# Patient Record
Sex: Male | Born: 1940 | Race: White | Hispanic: No | Marital: Married | Smoking: Current some day smoker
Health system: Southern US, Community
[De-identification: ages and names within clinical notes are randomized; demographics above are authoritative.]

## PROBLEM LIST (undated history)

## (undated) DIAGNOSIS — H269 Unspecified cataract: Secondary | ICD-10-CM

## (undated) DIAGNOSIS — M1990 Unspecified osteoarthritis, unspecified site: Secondary | ICD-10-CM

## (undated) DIAGNOSIS — N186 End stage renal disease: Secondary | ICD-10-CM

## (undated) DIAGNOSIS — J449 Chronic obstructive pulmonary disease, unspecified: Secondary | ICD-10-CM

## (undated) DIAGNOSIS — Z9289 Personal history of other medical treatment: Secondary | ICD-10-CM

## (undated) DIAGNOSIS — R0602 Shortness of breath: Secondary | ICD-10-CM

## (undated) DIAGNOSIS — C44300 Unspecified malignant neoplasm of skin of unspecified part of face: Secondary | ICD-10-CM

## (undated) DIAGNOSIS — E119 Type 2 diabetes mellitus without complications: Secondary | ICD-10-CM

## (undated) DIAGNOSIS — E46 Unspecified protein-calorie malnutrition: Secondary | ICD-10-CM

## (undated) DIAGNOSIS — Z992 Dependence on renal dialysis: Secondary | ICD-10-CM

## (undated) DIAGNOSIS — J189 Pneumonia, unspecified organism: Secondary | ICD-10-CM

## (undated) DIAGNOSIS — Z950 Presence of cardiac pacemaker: Secondary | ICD-10-CM

## (undated) DIAGNOSIS — C9000 Multiple myeloma not having achieved remission: Secondary | ICD-10-CM

## (undated) DIAGNOSIS — I10 Essential (primary) hypertension: Secondary | ICD-10-CM

## (undated) HISTORY — PX: AV FISTULA PLACEMENT: SHX1204

## (undated) HISTORY — PX: APPENDECTOMY: SHX54

---

## 1998-11-15 HISTORY — PX: PARTIAL NEPHRECTOMY: SHX414

## 2012-06-27 ENCOUNTER — Other Ambulatory Visit: Payer: Self-pay | Admitting: Nephrology

## 2012-06-30 ENCOUNTER — Ambulatory Visit
Admission: RE | Admit: 2012-06-30 | Discharge: 2012-06-30 | Disposition: A | Discharge status: Home / Self Care | Payer: Medicare Other | Source: Ambulatory Visit | Attending: Nephrology | Admitting: Nephrology

## 2012-07-14 HISTORY — PX: INSERT / REPLACE / REMOVE PACEMAKER: SUR710

## 2013-02-03 ENCOUNTER — Other Ambulatory Visit (HOSPITAL_COMMUNITY)
Admission: RE | Admit: 2013-02-03 | Disposition: A | Discharge status: Home / Self Care | Payer: Medicare Other | Source: Ambulatory Visit | Attending: Oncology | Admitting: Oncology

## 2013-02-03 LAB — BONE MARROW EXAM: Bone Marrow Exam: 820

## 2013-03-07 LAB — TISSUE HYBRIDIZATION (BONE MARROW)-NCBH

## 2013-03-07 LAB — CHROMOSOME ANALYSIS, BONE MARROW

## 2013-03-17 ENCOUNTER — Observation Stay (HOSPITAL_COMMUNITY): Payer: Medicare HMO

## 2013-03-17 ENCOUNTER — Encounter (HOSPITAL_COMMUNITY): Payer: Self-pay | Admitting: Emergency Medicine

## 2013-03-17 ENCOUNTER — Inpatient Hospital Stay (HOSPITAL_COMMUNITY)
Admission: EM | Admit: 2013-03-17 | Discharge: 2013-03-22 | DRG: 682 | Disposition: A | Discharge status: Home / Self Care | Payer: Medicare HMO | Attending: Internal Medicine | Admitting: Internal Medicine

## 2013-03-17 DIAGNOSIS — E875 Hyperkalemia: Secondary | ICD-10-CM | POA: Diagnosis present

## 2013-03-17 DIAGNOSIS — N179 Acute kidney failure, unspecified: Principal | ICD-10-CM | POA: Diagnosis present

## 2013-03-17 DIAGNOSIS — Z9221 Personal history of antineoplastic chemotherapy: Secondary | ICD-10-CM

## 2013-03-17 DIAGNOSIS — R64 Cachexia: Secondary | ICD-10-CM | POA: Diagnosis present

## 2013-03-17 DIAGNOSIS — Z87898 Personal history of other specified conditions: Secondary | ICD-10-CM

## 2013-03-17 DIAGNOSIS — N189 Chronic kidney disease, unspecified: Secondary | ICD-10-CM

## 2013-03-17 DIAGNOSIS — E46 Unspecified protein-calorie malnutrition: Secondary | ICD-10-CM | POA: Diagnosis present

## 2013-03-17 DIAGNOSIS — Z79899 Other long term (current) drug therapy: Secondary | ICD-10-CM

## 2013-03-17 DIAGNOSIS — C9000 Multiple myeloma not having achieved remission: Secondary | ICD-10-CM

## 2013-03-17 DIAGNOSIS — F17200 Nicotine dependence, unspecified, uncomplicated: Secondary | ICD-10-CM | POA: Diagnosis present

## 2013-03-17 DIAGNOSIS — E872 Acidosis, unspecified: Secondary | ICD-10-CM | POA: Diagnosis present

## 2013-03-17 DIAGNOSIS — D61818 Other pancytopenia: Secondary | ICD-10-CM | POA: Diagnosis present

## 2013-03-17 DIAGNOSIS — T451X5A Adverse effect of antineoplastic and immunosuppressive drugs, initial encounter: Secondary | ICD-10-CM | POA: Diagnosis present

## 2013-03-17 DIAGNOSIS — E43 Unspecified severe protein-calorie malnutrition: Secondary | ICD-10-CM

## 2013-03-17 DIAGNOSIS — E861 Hypovolemia: Secondary | ICD-10-CM | POA: Diagnosis present

## 2013-03-17 DIAGNOSIS — E860 Dehydration: Secondary | ICD-10-CM | POA: Diagnosis present

## 2013-03-17 DIAGNOSIS — E162 Hypoglycemia, unspecified: Secondary | ICD-10-CM | POA: Diagnosis present

## 2013-03-17 DIAGNOSIS — N184 Chronic kidney disease, stage 4 (severe): Secondary | ICD-10-CM | POA: Diagnosis present

## 2013-03-17 HISTORY — DX: Multiple myeloma not having achieved remission: C90.00

## 2013-03-17 HISTORY — DX: Unspecified osteoarthritis, unspecified site: M19.90

## 2013-03-17 HISTORY — DX: Unspecified malignant neoplasm of skin of unspecified part of face: C44.300

## 2013-03-17 HISTORY — DX: Presence of cardiac pacemaker: Z95.0

## 2013-03-17 HISTORY — DX: Shortness of breath: R06.02

## 2013-03-17 LAB — CBC WITH DIFFERENTIAL/PLATELET
BASOS ABS: 0 10*3/uL (ref 0.0–0.1)
Basophils Relative: 0 % (ref 0–1)
EOS ABS: 0.1 10*3/uL (ref 0.0–0.7)
Eosinophils Relative: 2 % (ref 0–5)
HCT: 25 % — ABNORMAL LOW (ref 39.0–52.0)
Hemoglobin: 8.5 g/dL — ABNORMAL LOW (ref 13.0–17.0)
LYMPHS ABS: 0.6 10*3/uL — AB (ref 0.7–4.0)
LYMPHS PCT: 19 % (ref 12–46)
MCH: 29 pg (ref 26.0–34.0)
MCHC: 34 g/dL (ref 30.0–36.0)
MCV: 85.3 fL (ref 78.0–100.0)
Monocytes Absolute: 0.6 10*3/uL (ref 0.1–1.0)
Monocytes Relative: 21 % — ABNORMAL HIGH (ref 3–12)
NEUTROS PCT: 59 % (ref 43–77)
Neutro Abs: 1.8 10*3/uL (ref 1.7–7.7)
PLATELETS: 67 10*3/uL — AB (ref 150–400)
RBC: 2.93 MIL/uL — ABNORMAL LOW (ref 4.22–5.81)
RDW: 16.1 % — ABNORMAL HIGH (ref 11.5–15.5)
WBC: 3 10*3/uL — AB (ref 4.0–10.5)

## 2013-03-17 LAB — COMPREHENSIVE METABOLIC PANEL
ALK PHOS: 95 U/L (ref 39–117)
ALT: 23 U/L (ref 0–53)
AST: 14 U/L (ref 0–37)
Albumin: 2.9 g/dL — ABNORMAL LOW (ref 3.5–5.2)
BUN: 94 mg/dL — ABNORMAL HIGH (ref 6–23)
CO2: 17 mEq/L — ABNORMAL LOW (ref 19–32)
Calcium: 8.4 mg/dL (ref 8.4–10.5)
Chloride: 107 mEq/L (ref 96–112)
Creatinine, Ser: 5.05 mg/dL — ABNORMAL HIGH (ref 0.50–1.35)
GFR calc non Af Amer: 10 mL/min — ABNORMAL LOW (ref >90)
GFR, EST AFRICAN AMERICAN: 12 mL/min — AB (ref >90)
GLUCOSE: 100 mg/dL — AB (ref 70–99)
POTASSIUM: 5.7 meq/L — AB (ref 3.7–5.3)
SODIUM: 139 meq/L (ref 137–147)
Total Bilirubin: 0.2 mg/dL — ABNORMAL LOW (ref 0.3–1.2)
Total Protein: 6.3 g/dL (ref 6.0–8.3)

## 2013-03-17 LAB — HEPATIC FUNCTION PANEL
ALT: 20 U/L (ref 0–53)
AST: 12 U/L (ref 0–37)
Albumin: 2.7 g/dL — ABNORMAL LOW (ref 3.5–5.2)
Alkaline Phosphatase: 85 U/L (ref 39–117)
TOTAL PROTEIN: 5.9 g/dL — AB (ref 6.0–8.3)
Total Bilirubin: 0.2 mg/dL — ABNORMAL LOW (ref 0.3–1.2)

## 2013-03-17 LAB — POCT I-STAT TROPONIN I: Troponin i, poc: 0.09 ng/mL (ref 0.00–0.08)

## 2013-03-17 LAB — URINE MICROSCOPIC-ADD ON

## 2013-03-17 LAB — URINALYSIS, ROUTINE W REFLEX MICROSCOPIC
Bilirubin Urine: NEGATIVE
GLUCOSE, UA: NEGATIVE mg/dL
KETONES UR: NEGATIVE mg/dL
Leukocytes, UA: NEGATIVE
NITRITE: NEGATIVE
Protein, ur: 30 mg/dL — AB
Specific Gravity, Urine: 1.013 (ref 1.005–1.030)
Urobilinogen, UA: 0.2 mg/dL (ref 0.0–1.0)
pH: 5 (ref 5.0–8.0)

## 2013-03-17 LAB — TROPONIN I

## 2013-03-17 LAB — MAGNESIUM: Magnesium: 2.2 mg/dL (ref 1.5–2.5)

## 2013-03-17 LAB — ABO/RH: ABO/RH(D): A POS

## 2013-03-17 LAB — PHOSPHORUS: PHOSPHORUS: 4.2 mg/dL (ref 2.3–4.6)

## 2013-03-17 MED ORDER — LEVALBUTEROL HCL 0.63 MG/3ML IN NEBU
0.6300 mg | INHALATION_SOLUTION | Freq: Four times a day (QID) | RESPIRATORY_TRACT | Status: DC
  Administered 2013-03-17: 0.63 mg via RESPIRATORY_TRACT
  Filled 2013-03-17: qty 3

## 2013-03-17 MED ORDER — ASPIRIN 325 MG PO TABS
325.0000 mg | ORAL_TABLET | Freq: Every day | ORAL | Status: DC
  Filled 2013-03-17: qty 1

## 2013-03-17 MED ORDER — ONDANSETRON HCL 4 MG PO TABS: 4.0000 mg | ORAL_TABLET | Freq: Four times a day (QID) | ORAL | Status: DC | PRN

## 2013-03-17 MED ORDER — ONDANSETRON HCL 4 MG/2ML IJ SOLN
4.0000 mg | Freq: Four times a day (QID) | INTRAMUSCULAR | Status: DC | PRN
  Administered 2013-03-17: 4 mg via INTRAVENOUS
  Filled 2013-03-17: qty 2

## 2013-03-17 MED ORDER — TAMSULOSIN HCL 0.4 MG PO CAPS
0.4000 mg | ORAL_CAPSULE | Freq: Every day | ORAL | Status: DC
  Administered 2013-03-18 – 2013-03-22 (×5): 0.4 mg via ORAL
  Filled 2013-03-17 (×7): qty 1

## 2013-03-17 MED ORDER — LEVALBUTEROL HCL 0.63 MG/3ML IN NEBU: 0.6300 mg | INHALATION_SOLUTION | Freq: Four times a day (QID) | RESPIRATORY_TRACT | Status: DC | PRN

## 2013-03-17 MED ORDER — ACETAMINOPHEN 325 MG PO TABS: 650.0000 mg | ORAL_TABLET | Freq: Four times a day (QID) | ORAL | Status: DC | PRN

## 2013-03-17 MED ORDER — ACETAMINOPHEN 650 MG RE SUPP: 650.0000 mg | Freq: Four times a day (QID) | RECTAL | Status: DC | PRN

## 2013-03-17 MED ORDER — SODIUM CHLORIDE 0.9 % IV SOLN
INTRAVENOUS | Status: DC
Start: 2013-03-17 — End: 2013-03-18
  Administered 2013-03-17 – 2013-03-18 (×2): via INTRAVENOUS

## 2013-03-17 MED ORDER — DEXAMETHASONE 6 MG PO TABS
40.0000 mg | ORAL_TABLET | Freq: Every day | ORAL | Status: DC
  Filled 2013-03-17: qty 1

## 2013-03-17 MED ORDER — AMIODARONE HCL 200 MG PO TABS
200.0000 mg | ORAL_TABLET | Freq: Every day | ORAL | Status: DC
  Administered 2013-03-18 – 2013-03-22 (×5): 200 mg via ORAL
  Filled 2013-03-17 (×5): qty 1

## 2013-03-17 MED ORDER — ACYCLOVIR 400 MG PO TABS: 400.0000 mg | ORAL_TABLET | Freq: Every day | ORAL | Status: DC

## 2013-03-17 MED ORDER — ACYCLOVIR 400 MG PO TABS
400.0000 mg | ORAL_TABLET | Freq: Every day | ORAL | Status: DC
  Administered 2013-03-18 – 2013-03-22 (×5): 400 mg via ORAL
  Filled 2013-03-17 (×5): qty 1

## 2013-03-17 MED ORDER — SODIUM CHLORIDE 0.9 % IV BOLUS (SEPSIS)
1000.0000 mL | Freq: Once | INTRAVENOUS | Status: AC
  Administered 2013-03-17: 1000 mL via INTRAVENOUS

## 2013-03-17 MED ORDER — IPRATROPIUM BROMIDE 0.02 % IN SOLN
0.5000 mg | Freq: Four times a day (QID) | RESPIRATORY_TRACT | Status: DC
  Administered 2013-03-17: 0.5 mg via RESPIRATORY_TRACT
  Filled 2013-03-17: qty 2.5

## 2013-03-17 MED ORDER — SODIUM POLYSTYRENE SULFONATE 15 GM/60ML PO SUSP
15.0000 g | Freq: Once | ORAL | Status: AC
  Administered 2013-03-17: 15 g via ORAL
  Filled 2013-03-17: qty 60

## 2013-03-17 NOTE — ED Notes (Signed)
Pt seen at [facility] today and sent here due to blood work. HGB 8.6 and PLT 78. Pt reports weakness. No pain. Dx with multiple myeloma. BP 96/42. Pt has IV 20G in RAC. 

## 2013-03-17 NOTE — H&P (Addendum)
[facility] History and Physical  Scott [name] BPP:[id number] DOB: 1940-03-28 DOA: 03/17/2013  Referring physician: Renal failure PCP: Pcp Not In System   Chief Complaint: Elevated creatinine, abnormal labs  HPI:  73 year old male with a history of multiple myeloma currently being treated by Derwood Kaplan, MD, at the [facility], who was sent to the ED because of unavailability of nephrology services at [facility]. Patient has a baseline creatinine of around 3. His creatinine was found to be 5 at the cancer treatment Center. The patient recently started chemotherapy 3 weeks ago. Since then the patient's by mouth intake has been decreased. He appears dehydrated. His lips are dry, he reports decreased oral intake. He has been more sleepy. He is currently getting injections biweekly and taking revlimid for multiple myeloma EDP checked  troponin but the patient does not report any cardiopulmonary symptoms. No chest pain  He denies any fever, dysuria, shortness of breath is chronic, the patient smokes half a pack a day       Review of Systems: negative for the following  Constitutional: Denies fever, chills, diaphoresis, appetite change and fatigue.  HEENT: Denies photophobia, eye pain, redness, hearing loss, ear pain, congestion, sore throat, rhinorrhea, sneezing, mouth sores, trouble swallowing, neck pain, neck stiffness and tinnitus.  Respiratory: Positive for SOB, DOE, cough, chest tightness, and wheezing.  Cardiovascular: Denies chest pain, palpitations and leg swelling.  Gastrointestinal: Denies nausea, vomiting, abdominal pain, diarrhea, constipation, blood in stool and abdominal distention.  Genitourinary: Denies dysuria, urgency, frequency, hematuria, flank pain and difficulty urinating.  Musculoskeletal: Denies myalgias, back pain, joint swelling, arthralgias and gait problem.  Skin: Denies pallor, rash and wound.  Neurological: Denies dizziness,  seizures, syncope, weakness, light-headedness, numbness and headaches.  Hematological: Denies adenopathy. Easy bruising, personal or family bleeding history  Psychiatric/Behavioral: Denies suicidal ideation, mood changes, confusion, nervousness, sleep disturbance and agitation       Past Medical History  Diagnosis Date  . Multiple myeloma   . Renal disorder      No past surgical history on file.    Social History:  has no tobacco, alcohol, and drug history on file. Patient smokes half a pack a day    No Known Allergies  No family history on file.   Prior to Admission medications   Medication Sig Start Date End Date Taking? Authorizing Provider  acyclovir (ZOVIRAX) 400 MG tablet Take 400 mg by mouth daily.   Yes Historical Provider, MD  amiodarone (PACERONE) 200 MG tablet Take 200 mg by mouth daily.   Yes Historical Provider, MD  aspirin 325 MG tablet Take 325 mg by mouth daily.   Yes Historical Provider, MD  dexamethasone (DECADRON) 4 MG tablet Take 40 mg by mouth daily. Every Tuesday.   Yes Historical Provider, MD  lenalidomide (REVLIMID) 10 MG capsule Take 10 mg by mouth daily.   Yes Historical Provider, MD  sulfamethoxazole-trimethoprim (BACTRIM DS) 800-160 MG per tablet Take 1 tablet by mouth 2 (two) times daily.   Yes Historical Provider, MD  tamsulosin (FLOMAX) 0.4 MG CAPS capsule Take 0.4 mg by mouth daily after breakfast.   Yes Historical Provider, MD     Physical Exam: Filed Vitals:   03/17/13 1600 03/17/13 1630 03/17/13 1700 03/17/13 1730  BP: 115/56 121/59 107/55 102/51  Pulse: 65 81 104 107  Temp:      TempSrc:      Resp: 15 18 17 18   SpO2: 99% 97% 98% 99%  Constitutional: Vital signs reviewed. Patient is a well-developed and well-nourished in no acute distress and cooperative with exam. Alert and oriented x3.  Head: Normocephalic and atraumatic  Ear: TM normal bilaterally  Mouth: no erythema or exudates, MMM  Eyes: PERRL, EOMI, conjunctivae  normal, No scleral icterus.  Neck: Supple, Trachea midline normal ROM, No JVD, mass, thyromegaly, or carotid bruit present.  Cardiovascular: RRR, S1 normal, S2 normal, no MRG, pulses symmetric and intact bilaterally  Pulmonary/Chest: CTAB, no wheezes, rales, or rhonchi  Abdominal: Soft. Non-tender, non-distended, bowel sounds are normal, no masses, organomegaly, or guarding present.  GU: no CVA tenderness Musculoskeletal: No joint deformities, erythema, or stiffness, ROM full and no nontender Ext: no edema and no cyanosis, pulses palpable bilaterally (DP and PT)  Hematology: no cervical, inginal, or axillary adenopathy.  Neurological: A&O x3, Strenght is normal and symmetric bilaterally, cranial nerve II-XII are grossly intact, no focal motor deficit, sensory intact to light touch bilaterally.  Skin: Warm, dry and intact. No rash, cyanosis, or clubbing.  Psychiatric: Normal mood and affect. speech and behavior is normal. Judgment and thought content normal. Cognition and memory are normal.       Labs on Admission:    Basic Metabolic Panel:  Recent Labs Lab 03/17/13 1500  NA 139  K 5.7*  CL 107  CO2 17*  GLUCOSE 100*  BUN 94*  CREATININE 5.05*  CALCIUM 8.4   Liver Function Tests:  Recent Labs Lab 03/17/13 1500  AST 14  ALT 23  ALKPHOS 95  BILITOT 0.2*  PROT 6.3  ALBUMIN 2.9*   No results found for this basename: LIPASE, AMYLASE,  in the last 168 hours No results found for this basename: AMMONIA,  in the last 168 hours CBC:  Recent Labs Lab 03/17/13 1500  WBC 3.0*  NEUTROABS 1.8  HGB 8.5*  HCT 25.0*  MCV 85.3  PLT 67*   Cardiac Enzymes: No results found for this basename: CKTOTAL, CKMB, CKMBINDEX, TROPONINI,  in the last 168 hours  BNP (last 3 results) No results found for this basename: PROBNP,  in the last 8760 hours    CBG: No results found for this basename: GLUCAP,  in the last 168 hours  Radiological Exams on Admission: No results  found.  EKG: Independently reviewed.  Assessment/Plan Active Problems:   Hyperkalemia   Acute on chronic renal failure, CK D. stage IV Patient appears to be clinically dehydrated and is likely prerenal Initial blood pressure was in the 90s Dr. Florene Glen  , nephrology has been notified he recommends IV hydration No gross focal symptoms of infection Obtain chest x-ray because of shortness of breath  Hyperkalemia We'll give Kayexalate and repeat BMP tonight  Multiple myeloma Patient is anemic, thrombocytopenic Hold Lovenox for DVT prophylaxis given thrombocytopenia   Smoking cessation counseling done Due to chronic shortness of breath blood in the chest x-ray, 2-D echo  Code Status:   full Family Communication: bedside Disposition Plan: admit for observation  Time spent: 70 mins   [facility] Pager [phone]  If 7PM-7AM, please contact night-coverage [email or web address] Password [facility] 03/17/2013, 5:37 PM

## 2013-03-17 NOTE — ED Provider Notes (Addendum)
CSN: [id number]     Arrival date & time 03/17/13  85 History   First MD Initiated Contact with Patient 03/17/13 1446     Chief Complaint  Patient presents with  . Abnormal Lab   (Consider location/radiation/quality/duration/timing/severity/associated sxs/prior Treatment) HPI Comments: Pt sent by cancer center due to worsening renal function.  Pt has had decreased Po's but o/w no complaints.  He is currently getting injections biweekly and taking revlimid for multiple myeloma.  He denies N/V/D/abd pain, fever, dysuria or edema.  Per family pt has had ongoing renal issues since having partial kidney removal for CA.  States Cr has been in the 4's for 5-6 months.  Unclear what it is today.  The history is provided by the patient and a relative.    Past Medical History  Diagnosis Date  . Multiple myeloma   . Renal disorder    No past surgical history on file. No family history on file. History  Substance Use Topics  . Smoking status: Not on file  . Smokeless tobacco: Not on file  . Alcohol Use: Not on file    Review of Systems  All other systems reviewed and are negative.    Allergies  Review of patient's allergies indicates no known allergies.  Home Medications   Current Outpatient Rx  Name  Route  Sig  Dispense  Refill  . acyclovir (ZOVIRAX) 400 MG tablet   Oral   Take 400 mg by mouth daily.         Marland Kitchen amiodarone (PACERONE) 200 MG tablet   Oral   Take 200 mg by mouth daily.         Marland Kitchen aspirin 325 MG tablet   Oral   Take 325 mg by mouth daily.         Marland Kitchen dexamethasone (DECADRON) 4 MG tablet   Oral   Take 40 mg by mouth daily. Every Tuesday.         . lenalidomide (REVLIMID) 10 MG capsule   Oral   Take 10 mg by mouth daily.         Marland Kitchen sulfamethoxazole-trimethoprim (BACTRIM DS) 800-160 MG per tablet   Oral   Take 1 tablet by mouth 2 (two) times daily.         . tamsulosin (FLOMAX) 0.4 MG CAPS capsule   Oral   Take 0.4 mg by mouth daily after  breakfast.          BP 115/51  Pulse 60  Temp(Src) 97.8 F (36.6 C) (Oral)  Resp 22  SpO2 97% Physical Exam  Nursing note and vitals reviewed. Constitutional: He is oriented to person, place, and time. He appears well-developed and well-nourished. No distress.  HENT:  Head: Normocephalic and atraumatic.  Mouth/Throat: Oropharynx is clear and moist.  Eyes: Conjunctivae and EOM are normal. Pupils are equal, round, and reactive to light.  Neck: Normal range of motion. Neck supple.  Cardiovascular: Normal rate, regular rhythm and intact distal pulses.   No murmur heard. Pulmonary/Chest: Effort normal and breath sounds normal. No respiratory distress. He has no wheezes. He has no rales.  Abdominal: Soft. He exhibits no distension. There is no tenderness. There is no rebound and no guarding.  Multiple well healed abd scars.  Reducible soft ventral hernia  Musculoskeletal: Normal range of motion. He exhibits no edema and no tenderness.  Neurological: He is alert and oriented to person, place, and time.  Skin: Skin is warm and dry. No rash noted. No erythema.  Psychiatric: He has a normal mood and affect. His behavior is normal.    ED Course  Procedures (including critical care time) Labs Review Labs Reviewed  CBC WITH DIFFERENTIAL - Abnormal; Notable for the following:    WBC 3.0 (*)    RBC 2.93 (*)    Hemoglobin 8.5 (*)    HCT 25.0 (*)    RDW 16.1 (*)    Platelets 67 (*)    Lymphs Abs 0.6 (*)    Monocytes Relative 21 (*)    All other components within normal limits  COMPREHENSIVE METABOLIC PANEL - Abnormal; Notable for the following:    Potassium 5.7 (*)    CO2 17 (*)    Glucose, Bld 100 (*)    BUN 94 (*)    Creatinine, Ser 5.05 (*)    Albumin 2.9 (*)    Total Bilirubin 0.2 (*)    GFR calc non Af Amer 10 (*)    GFR calc Af Amer 12 (*)    All other components within normal limits  URINALYSIS, ROUTINE W REFLEX MICROSCOPIC - Abnormal; Notable for the following:    Hgb  urine dipstick MODERATE (*)    Protein, ur 30 (*)    All other components within normal limits  URINE MICROSCOPIC-ADD ON - Abnormal; Notable for the following:    Casts HYALINE CASTS (*)    All other components within normal limits  POCT I-STAT TROPONIN I - Abnormal; Notable for the following:    Troponin i, poc 0.09 (*)    All other components within normal limits  TYPE AND SCREEN  ABO/RH   Imaging Review No results found.  EKG Interpretation   None       MDM   1. Acute renal failure   2. Dehydration     Pt sent here from the cancer center due to worsening Cr.  Pt has no complaints.  Family member states Hb has been in the 8's and states thinks Cr stays in the 4's.  Unclear what Cr is today.  Family states decreased po's but was sent here to see the nephrologist. Here pt has no focal findings and borderline BP of 96/42.  Will give IVF and check CBC, CMP, UA.  4:56 PM Labs consistent with Cr of 5.0 and UA without signs of infection.  Will discuss with Dr. Florene Glen pt's nephrologist for further recommendations.  5:09 PM Cr today is 5 and after speaking with nephrology baseline is 3.  They feel he needs admission for hydration and following creatinine to ensure improvement prior to d/c.  Blanchie Dessert, MD 03/17/13 1710  Blanchie Dessert, MD 03/17/13 1726

## 2013-03-17 NOTE — ED Notes (Signed)
Pt is unable to give urine specimen at this time. The patient has been advised to use call light for assistance to restroom. The tech has reported to the RN in charge.

## 2013-03-17 NOTE — ED Notes (Signed)
PT. Reports not being hungry and also is having a headache, but does not want any medication.

## 2013-03-17 NOTE — Progress Notes (Signed)
Report received from [facility] in the ED.

## 2013-03-18 DIAGNOSIS — E43 Unspecified severe protein-calorie malnutrition: Secondary | ICD-10-CM | POA: Insufficient documentation

## 2013-03-18 DIAGNOSIS — E860 Dehydration: Secondary | ICD-10-CM

## 2013-03-18 DIAGNOSIS — D61818 Other pancytopenia: Secondary | ICD-10-CM

## 2013-03-18 DIAGNOSIS — E872 Acidosis, unspecified: Secondary | ICD-10-CM

## 2013-03-18 DIAGNOSIS — N179 Acute kidney failure, unspecified: Secondary | ICD-10-CM | POA: Diagnosis present

## 2013-03-18 DIAGNOSIS — C9000 Multiple myeloma not having achieved remission: Secondary | ICD-10-CM

## 2013-03-18 DIAGNOSIS — E162 Hypoglycemia, unspecified: Secondary | ICD-10-CM

## 2013-03-18 DIAGNOSIS — N184 Chronic kidney disease, stage 4 (severe): Secondary | ICD-10-CM | POA: Diagnosis present

## 2013-03-18 LAB — BASIC METABOLIC PANEL
BUN: 93 mg/dL — ABNORMAL HIGH (ref 6–23)
CALCIUM: 8.1 mg/dL — AB (ref 8.4–10.5)
CO2: 14 mEq/L — ABNORMAL LOW (ref 19–32)
CREATININE: 4.98 mg/dL — AB (ref 0.50–1.35)
Chloride: 108 mEq/L (ref 96–112)
GFR calc Af Amer: 12 mL/min — ABNORMAL LOW (ref >90)
GFR calc non Af Amer: 11 mL/min — ABNORMAL LOW (ref >90)
Glucose, Bld: 122 mg/dL — ABNORMAL HIGH (ref 70–99)
Potassium: 5.1 mEq/L (ref 3.7–5.3)
Sodium: 140 mEq/L (ref 137–147)

## 2013-03-18 LAB — COMPREHENSIVE METABOLIC PANEL
ALT: 17 U/L (ref 0–53)
AST: 10 U/L (ref 0–37)
Albumin: 2.4 g/dL — ABNORMAL LOW (ref 3.5–5.2)
Alkaline Phosphatase: 77 U/L (ref 39–117)
BUN: 91 mg/dL — ABNORMAL HIGH (ref 6–23)
CALCIUM: 7.7 mg/dL — AB (ref 8.4–10.5)
CO2: 17 mEq/L — ABNORMAL LOW (ref 19–32)
Chloride: 109 mEq/L (ref 96–112)
Creatinine, Ser: 5.03 mg/dL — ABNORMAL HIGH (ref 0.50–1.35)
GFR calc Af Amer: 12 mL/min — ABNORMAL LOW (ref >90)
GFR calc non Af Amer: 11 mL/min — ABNORMAL LOW (ref >90)
Glucose, Bld: 63 mg/dL — ABNORMAL LOW (ref 70–99)
Potassium: 5.3 mEq/L (ref 3.7–5.3)
Sodium: 140 mEq/L (ref 137–147)
TOTAL PROTEIN: 5.3 g/dL — AB (ref 6.0–8.3)
Total Bilirubin: <0.2 mg/dL — ABNORMAL LOW (ref 0.3–1.2)

## 2013-03-18 LAB — TSH: TSH: 2.594 u[IU]/mL (ref 0.350–4.500)

## 2013-03-18 LAB — CBC
HEMATOCRIT: 20.4 % — AB (ref 39.0–52.0)
HEMOGLOBIN: 7 g/dL — AB (ref 13.0–17.0)
MCH: 29.8 pg (ref 26.0–34.0)
MCHC: 34.3 g/dL (ref 30.0–36.0)
MCV: 86.8 fL (ref 78.0–100.0)
Platelets: 50 10*3/uL — ABNORMAL LOW (ref 150–400)
RBC: 2.35 MIL/uL — ABNORMAL LOW (ref 4.22–5.81)
RDW: 16.3 % — ABNORMAL HIGH (ref 11.5–15.5)
WBC: 1.8 10*3/uL — AB (ref 4.0–10.5)

## 2013-03-18 LAB — TROPONIN I
Troponin I: <0.3 ng/mL (ref <0.30)
Troponin I: <0.3 ng/mL (ref <0.30)

## 2013-03-18 MED ORDER — NEPRO/CARBSTEADY PO LIQD
237.0000 mL | Freq: Three times a day (TID) | ORAL | Status: DC
  Administered 2013-03-18 – 2013-03-19 (×4): 237 mL via ORAL

## 2013-03-18 MED ORDER — SODIUM BICARBONATE 8.4 % IV SOLN
INTRAVENOUS | Status: DC
  Administered 2013-03-18 – 2013-03-20 (×4): via INTRAVENOUS
  Filled 2013-03-18 (×9): qty 50

## 2013-03-18 MED ORDER — BOOST / RESOURCE BREEZE PO LIQD
1.0000 | Freq: Three times a day (TID) | ORAL | Status: DC
  Administered 2013-03-18 – 2013-03-19 (×3): 1 via ORAL

## 2013-03-18 MED ORDER — WHITE PETROLATUM GEL
Status: AC
  Administered 2013-03-18: 09:00:00
  Filled 2013-03-18: qty 5

## 2013-03-18 NOTE — Progress Notes (Signed)
03/18/13 0800 nsg Wife in room helps pt to bsc and adl's refuses bed alarm.

## 2013-03-18 NOTE — Progress Notes (Signed)
INITIAL NUTRITION ASSESSMENT  DOCUMENTATION CODES Per approved criteria  -Severe malnutrition in the context of chronic illness   INTERVENTION: Resource Breeze po TID, each supplement provides 250 kcal and 9 grams of protein  Continue Nepro TID  NUTRITION DIAGNOSIS: Malnutrition related to chronic illness as evidenced by severe fat and muscle wasting, po intake </= 75% of his needs for >/= 1 month, and 29% wt loss x 1 year.   Goal: Pt to meet >/= 90% of their estimated nutrition needs   Monitor:  PO intake, supplement acceptance, weight trend, labs   Reason for Assessment: MD Consult   73 y.o. male  Admitting Dx: Acute renal failure  ASSESSMENT: Pt recently dx with multiple myeloma currently being treated by Derwood Kaplan, MD, at the [facility], who was sent to the ED because of unavailability of nephrology services at [facility].  Pt started chemo beginning of December 2014, since then pt's intake has been decreased.  Pt with ARF, medications d/c'ed. Per MD note no indication for HD at this time. Pt being hydrated. Potassium, Magnesium, and Phosphorus are WNL.  Pt and wife report very poor appetite. Pt only eating a few bites. Pt with taste changes and states "I can't stand the taste of food". Pt has not tried any nutrition supplements. Pt has Nepro ordered and has not yet tried this. Willing to try both Nepro and Breeze. Discussed impact of inadequate nutrition on how he feels and ability to continue chemo. May need to consider liquid supplements as main intake until pt can eat more.   Nutrition Focused Physical Exam:  Subcutaneous Fat:  Orbital Region: severe wasting Upper Arm Region: severe wasting Thoracic and Lumbar Region: severe wasting  Muscle:  Temple Region: severe wasting Clavicle Bone Region: severe wasting Clavicle and Acromion Bone Region: severe wasting Scapular Bone Region: severe wasting Dorsal Hand: severe wasting Patellar  Region: severe wasting Anterior Thigh Region: severe wasting Posterior Calf Region: severe wasting  Edema: not present   Height: Ht Readings from Last 1 Encounters:  03/17/13 5' 5"  (1.651 m)    Weight: Wt Readings from Last 1 Encounters:  03/17/13 134 lb 0.6 oz (60.8 kg)    Ideal Body Weight: 61.8 kg   % Ideal Body Weight: 98%  Wt Readings from Last 10 Encounters:  03/17/13 134 lb 0.6 oz (60.8 kg)    Usual Body Weight: 190 lb 1 year ago  % Usual Body Weight: 71%  BMI:  Body mass index is 22.31 kg/(m^2).  Estimated Nutritional Needs: Kcal: 1850-2050 Protein: 90-110 grams Fluid: > 1.9 L/day  Skin: no issues noted  Diet Order: General Meal Completion: 25%  EDUCATION NEEDS: -No education needs identified at this time   Intake/Output Summary (Last 24 hours) at 03/18/13 1426 Last data filed at 03/18/13 1318  Gross per 24 hour  Intake    600 ml  Output    100 ml  Net    500 ml    Last BM: PTA   Labs:   Recent Labs Lab 03/17/13 1500 03/17/13 2031 03/17/13 2305 03/18/13 0500  NA 139  --  140 140  K 5.7*  --  5.1 5.3  CL 107  --  108 109  CO2 17*  --  14* 17*  BUN 94*  --  93* 91*  CREATININE 5.05*  --  4.98* 5.03*  CALCIUM 8.4  --  8.1* 7.7*  MG  --  2.2  --   --   PHOS  --  4.2  --   --   GLUCOSE 100*  --  122* 63*    CBG (last 3)  No results found for this basename: GLUCAP,  in the last 72 hours  Scheduled Meds: . acyclovir  400 mg Oral Daily  . amiodarone  200 mg Oral Daily  . feeding supplement (NEPRO CARB STEADY)  237 mL Oral TID BM  . tamsulosin  0.4 mg Oral QPC breakfast    Continuous Infusions: .  sodium bicarbonate  infusion 1000 mL 100 mL/hr at 03/18/13 1223    Past Medical History  Diagnosis Date  . Renal disorder   . Pacemaker   . Multiple myeloma   . Skin cancer of face     "once; left side of my face" (03/17/2013)  . Shortness of breath     "when I take one of my chemo pills" (03/17/2013)  . Daily headache   .  Arthritis     "little in my right shoulder" (03/17/2013)    Past Surgical History  Procedure Laterality Date  . Appendectomy    . Kidney surgery Right 2000's    "cut out ~ 1/2"   . Insert / replace / remove pacemaker      [place], [place], [place] Pager [phone] After Hours Pager

## 2013-03-18 NOTE — Progress Notes (Signed)
Utilization Review completed.  

## 2013-03-18 NOTE — Progress Notes (Signed)
Chart reviewed.   [facility] PROGRESS NOTE  Scott [name] JJO:[id number] DOB: 1940/06/29 DOA: 03/17/2013 PCP: Bufford Buttner, MD  Assessment/Plan:  Principal Problem:   Acute renal failure, likely prerenal. Continue IV fluid, but change to D5 with bicarbonate and increase rate.  baseline creatinine reportedly about 3.  patient was on Bactrim which may have contributed. This is been stopped.  nurses are not recording output, but patient reports good urine output. Will discuss with nursing staff. Potassium normal today.  no indication for dialysis at this time.  hopefully, will return to baseline with hydration and discontinuation of Bactrim. Active Problems:   Hyperkalemia  Resolved. Discontinue telemetry.   Other pancytopenia: Discussed with heme onc at [facility], Dr. Hermenia Bers. She agrees with holding Revlimid to encase contributing. Patient was diagnosed with multiple myeloma in November. Received chemotherapy, first round last month. She feels pancytopenia likely at least in part related to chemotherapy. Bone marrow biopsy showed iron present. She had recommended Procrit, but is still trying to get this authorized with insurance. She has not yet checked anemia panel. Will order. Will also order Aranesp, as likely not iron deficient based on bone marrow biopsy.  if hemoglobin drops lower, would transfuse. Patient agreeable. Todays drop likely related to IV fluids/dilution. No heparin products, just SCDs. Hold aspirin for now to avoid bleeding   Multiple myeloma   Chronic kidney disease (CKD), stage IV (severe)   Metabolic acidosis: See above   Hypoglycemia: See above Protein calorie millimeters shunt: His intake has not been great and he appears quite frail and thin. No weights available from previous. Will add Nepro and consult dietitian. Deconditioning: Patient surprisingly able to get up and out of bed but did require some assistance. Wife reports that he fell at home recently.  Did not injure himself. PT eval pending.   Code Status:  full Family Communication:  Wife at bedside Disposition Plan:  home  Consultants:  Nephrology consulted yesterday.  Procedures:     Antibiotics:    HPI/Subjective: Feels a little better. ate a bit. urinating.  Objective: Filed Vitals:   03/18/13 0524  BP: 99/58  Pulse: 74  Temp: 97.7 F (36.5 C)  Resp: 18    Intake/Output Summary (Last 24 hours) at 03/18/13 0956 Last data filed at 03/18/13 0531  Gross per 24 hour  Intake    240 ml  Output      0 ml  Net    240 ml   Filed Weights   03/17/13 2147  Weight: 60.8 kg (134 lb 0.6 oz)    Exam:   General:  Elderly, frail appearing.alert oriented and appropriate.  HEENT: Slightly dry mucous membranes  Cardiovascular: regular rate rhythm without murmurs gallops rubs  Respiratory: clear to auscultation bilaterally without wheezes rhonchi or rales  Abdomen: soft nontender nondistended  Ext: no clubbing cyanosis or edema  Basic Metabolic Panel:  Recent Labs Lab 03/17/13 1500 03/17/13 2031 03/17/13 2305 03/18/13 0500  NA 139  --  140 140  K 5.7*  --  5.1 5.3  CL 107  --  108 109  CO2 17*  --  14* 17*  GLUCOSE 100*  --  122* 63*  BUN 94*  --  93* 91*  CREATININE 5.05*  --  4.98* 5.03*  CALCIUM 8.4  --  8.1* 7.7*  MG  --  2.2  --   --   PHOS  --  4.2  --   --    Liver Function Tests:  Recent Labs Lab 03/17/13 1500 03/17/13 2031 03/18/13 0500  AST 14 12 10   ALT 23 20 17   ALKPHOS 95 85 77  BILITOT 0.2* 0.2* <0.2*  PROT 6.3 5.9* 5.3*  ALBUMIN 2.9* 2.7* 2.4*   No results found for this basename: LIPASE, AMYLASE,  in the last 168 hours No results found for this basename: AMMONIA,  in the last 168 hours CBC:  Recent Labs Lab 03/17/13 1500 03/18/13 0500  WBC 3.0* 1.8*  NEUTROABS 1.8  --   HGB 8.5* 7.0*  HCT 25.0* 20.4*  MCV 85.3 86.8  PLT 67* 50*   Cardiac Enzymes:  Recent Labs Lab 03/17/13 2031 03/17/13 2305 03/18/13 0500   TROPONINI <0.30 <0.30 <0.30   BNP (last 3 results) No results found for this basename: PROBNP,  in the last 8760 hours CBG: No results found for this basename: GLUCAP,  in the last 168 hours  No results found for this or any previous visit (from the past 240 hour(s)).   Studies: Dg Chest 2 View  03/17/2013   CLINICAL DATA:  History of osteomyelitis and renal failure, no chest complaints  EXAM: CHEST  2 VIEW  COMPARISON:  02/03/2013  FINDINGS: Heart size is normal. Vascular pattern is normal. Lungs are clear. Cardiac pacer is seen in unchanged position.  IMPRESSION: No active cardiopulmonary disease.   Electronically Signed   By: Skipper Cliche M.D.   On: 03/17/2013 21:09    Scheduled Meds: . acyclovir  400 mg Oral Daily  . amiodarone  200 mg Oral Daily  . dexamethasone  40 mg Oral Daily  . tamsulosin  0.4 mg Oral QPC breakfast  . white petrolatum       Continuous Infusions: .  sodium bicarbonate  infusion 1000 mL      Time spent: 45 minutes  [facility] Pager [phone]. If 7PM-7AM, please contact night-coverage at [email or web address], password [facility] 03/18/2013, 9:56 AM  LOS: 1 day

## 2013-03-19 DIAGNOSIS — E875 Hyperkalemia: Secondary | ICD-10-CM

## 2013-03-19 DIAGNOSIS — N189 Chronic kidney disease, unspecified: Secondary | ICD-10-CM

## 2013-03-19 DIAGNOSIS — E43 Unspecified severe protein-calorie malnutrition: Secondary | ICD-10-CM

## 2013-03-19 LAB — CBC WITH DIFFERENTIAL/PLATELET
BASOS PCT: 1 % (ref 0–1)
Basophils Absolute: 0 10*3/uL (ref 0.0–0.1)
Eosinophils Absolute: 0.1 10*3/uL (ref 0.0–0.7)
Eosinophils Relative: 7 % — ABNORMAL HIGH (ref 0–5)
HCT: 21.3 % — ABNORMAL LOW (ref 39.0–52.0)
HEMOGLOBIN: 7.3 g/dL — AB (ref 13.0–17.0)
LYMPHS PCT: 25 % (ref 12–46)
Lymphs Abs: 0.5 10*3/uL — ABNORMAL LOW (ref 0.7–4.0)
MCH: 29.8 pg (ref 26.0–34.0)
MCHC: 34.3 g/dL (ref 30.0–36.0)
MCV: 86.9 fL (ref 78.0–100.0)
Monocytes Absolute: 0.4 10*3/uL (ref 0.1–1.0)
Monocytes Relative: 18 % — ABNORMAL HIGH (ref 3–12)
Neutro Abs: 1 10*3/uL — ABNORMAL LOW (ref 1.7–7.7)
Neutrophils Relative %: 49 % (ref 43–77)
Platelets: 43 10*3/uL — ABNORMAL LOW (ref 150–400)
RBC: 2.45 MIL/uL — AB (ref 4.22–5.81)
RDW: 16.4 % — ABNORMAL HIGH (ref 11.5–15.5)
WBC: 2 10*3/uL — ABNORMAL LOW (ref 4.0–10.5)

## 2013-03-19 LAB — BASIC METABOLIC PANEL
BUN: 83 mg/dL — ABNORMAL HIGH (ref 6–23)
CHLORIDE: 107 meq/L (ref 96–112)
CO2: 18 mEq/L — ABNORMAL LOW (ref 19–32)
Calcium: 7.6 mg/dL — ABNORMAL LOW (ref 8.4–10.5)
Creatinine, Ser: 4.76 mg/dL — ABNORMAL HIGH (ref 0.50–1.35)
GFR calc Af Amer: 13 mL/min — ABNORMAL LOW (ref >90)
GFR calc non Af Amer: 11 mL/min — ABNORMAL LOW (ref >90)
GLUCOSE: 74 mg/dL (ref 70–99)
POTASSIUM: 4.4 meq/L (ref 3.7–5.3)
Sodium: 138 mEq/L (ref 137–147)

## 2013-03-19 LAB — PREPARE RBC (CROSSMATCH)

## 2013-03-19 MED ORDER — SODIUM CHLORIDE 0.9 % IV SOLN: INTRAVENOUS | Status: AC

## 2013-03-19 MED ORDER — SODIUM CHLORIDE 0.9 % IV SOLN
INTRAVENOUS | Status: DC
  Administered 2013-03-20: 20:00:00 via INTRAVENOUS
  Administered 2013-03-20: 100 mL/h via INTRAVENOUS
  Administered 2013-03-22: 02:00:00 via INTRAVENOUS

## 2013-03-19 NOTE — Progress Notes (Signed)
Chart reviewed.   [facility] PROGRESS NOTE  Scott [name] MAU:[id number] DOB: 07/03/1940 DOA: 03/17/2013 PCP: Scott Buttner, MD  Assessment/Plan:  Principal Problem: Acute on chronic renal failure, patient having a history of stage IV chronic kidney disease Likely secondary to prerenal azotemia and possibly nephrotoxicity from Bactrim contributing. Bactrim therapy has been discontinued. His creatinine slightly improved from 5.03 on 03/18/2013 to 4.76 on this mornings lab work. He remains on IV fluids.   Hyperkalemia  Resolved, likely secondary to renal failure. Discontinue telemetry.  Pancytopenia: Discussed with heme onc at [facility], Dr. Hermenia Bers. She agrees with holding Revlimid to encase contributing. Patient was diagnosed with multiple myeloma in November. Received chemotherapy, first round last month. She feels pancytopenia likely at least in part related to chemotherapy. Bone marrow biopsy showed iron present. She had recommended Procrit, but is still trying to get this authorized with insurance. She has not yet checked anemia panel. Will order. Will also order Aranesp, as likely not iron deficient based on bone marrow biopsy.  Acute on chronic anemia Patient reporting generalized weakness which in part is likely secondary to underlying malignancy. He is anemic with a hemoglobin at 7.3 this morning. We'll transfuse one unit of packed red blood cells today.    Multiple myeloma Patient undergoing chemotherapy. Case was discussed with his hematologist Dr. Anabel Bene at [place] has been held.   Protein calorie malnutrition:  Patient with history of multiple myeloma, cachectic, with significant weight loss. Continue protein boost 3 times a day.  Deconditioning:  Physical therapy consultation  Code Status:  full Family Communication:  Wife at bedside Disposition Plan:  home  Consultants:  Nephrology consulted yesterday.  Procedures:      Antibiotics:    HPI/Subjective: Feels a little better. ate a bit. urinating.  Objective: Filed Vitals:   03/19/13 1643  BP: 118/55  Pulse: 67  Temp: 98.1 F (36.7 C)  Resp: 18    Intake/Output Summary (Last 24 hours) at 03/19/13 1817 Last data filed at 03/19/13 1400  Gross per 24 hour  Intake 2648.34 ml  Output    700 ml  Net 1948.34 ml   Filed Weights   03/17/13 2147  Weight: 60.8 kg (134 lb 0.6 oz)    Exam:   General:  Elderly, cachectic, frail appearing.alert oriented and appropriate.  HEENT: Slightly dry mucous membranes  Cardiovascular: regular rate rhythm without murmurs gallops rubs  Respiratory: clear to auscultation bilaterally without wheezes rhonchi or rales  Abdomen: soft nontender nondistended  Ext: no clubbing cyanosis or edema  Basic Metabolic Panel:  Recent Labs Lab 03/17/13 1500 03/17/13 2031 03/17/13 2305 03/18/13 0500 03/19/13 0635  NA 139  --  140 140 138  K 5.7*  --  5.1 5.3 4.4  CL 107  --  108 109 107  CO2 17*  --  14* 17* 18*  GLUCOSE 100*  --  122* 63* 74  BUN 94*  --  93* 91* 83*  CREATININE 5.05*  --  4.98* 5.03* 4.76*  CALCIUM 8.4  --  8.1* 7.7* 7.6*  MG  --  2.2  --   --   --   PHOS  --  4.2  --   --   --    Liver Function Tests:  Recent Labs Lab 03/17/13 1500 03/17/13 2031 03/18/13 0500  AST 14 12 10   ALT 23 20 17   ALKPHOS 95 85 77  BILITOT 0.2* 0.2* <0.2*  PROT 6.3 5.9* 5.3*  ALBUMIN 2.9* 2.7*  2.4*   No results found for this basename: LIPASE, AMYLASE,  in the last 168 hours No results found for this basename: AMMONIA,  in the last 168 hours CBC:  Recent Labs Lab 03/17/13 1500 03/18/13 0500 03/19/13 0635  WBC 3.0* 1.8* 2.0*  NEUTROABS 1.8  --  1.0*  HGB 8.5* 7.0* 7.3*  HCT 25.0* 20.4* 21.3*  MCV 85.3 86.8 86.9  PLT 67* 50* 43*   Cardiac Enzymes:  Recent Labs Lab 03/17/13 2031 03/17/13 2305 03/18/13 0500  TROPONINI <0.30 <0.30 <0.30   BNP (last 3 results) No results found for  this basename: PROBNP,  in the last 8760 hours CBG: No results found for this basename: GLUCAP,  in the last 168 hours  No results found for this or any previous visit (from the past 240 hour(s)).   Studies: Dg Chest 2 View  03/17/2013   CLINICAL DATA:  History of osteomyelitis and renal failure, no chest complaints  EXAM: CHEST  2 VIEW  COMPARISON:  02/03/2013  FINDINGS: Heart size is normal. Vascular pattern is normal. Lungs are clear. Cardiac pacer is seen in unchanged position.  IMPRESSION: No active cardiopulmonary disease.   Electronically Signed   By: Skipper Cliche M.D.   On: 03/17/2013 21:09    Scheduled Meds: . sodium chloride   Intravenous STAT  . acyclovir  400 mg Oral Daily  . amiodarone  200 mg Oral Daily  . feeding supplement (NEPRO CARB STEADY)  237 mL Oral TID BM  . feeding supplement (RESOURCE BREEZE)  1 Container Oral TID BM  . tamsulosin  0.4 mg Oral QPC breakfast   Continuous Infusions: .  sodium bicarbonate  infusion 1000 mL 100 mL/hr at 03/19/13 1354    Time spent: 35 minutes  [name]  [facility] Pager [phone]. If 7PM-7AM, please contact night-coverage at [email or web address], password [facility] 03/19/2013, 6:17 PM  LOS: 2 days

## 2013-03-20 LAB — CBC WITH DIFFERENTIAL/PLATELET
Basophils Absolute: 0 10*3/uL (ref 0.0–0.1)
Basophils Relative: 0 % (ref 0–1)
EOS PCT: 9 % — AB (ref 0–5)
Eosinophils Absolute: 0.3 10*3/uL (ref 0.0–0.7)
HCT: 25.6 % — ABNORMAL LOW (ref 39.0–52.0)
Hemoglobin: 8.7 g/dL — ABNORMAL LOW (ref 13.0–17.0)
LYMPHS ABS: 0.5 10*3/uL — AB (ref 0.7–4.0)
LYMPHS PCT: 14 % (ref 12–46)
MCH: 29.2 pg (ref 26.0–34.0)
MCHC: 34 g/dL (ref 30.0–36.0)
MCV: 85.9 fL (ref 78.0–100.0)
MONOS PCT: 11 % (ref 3–12)
Monocytes Absolute: 0.4 10*3/uL (ref 0.1–1.0)
Neutro Abs: 2.4 10*3/uL (ref 1.7–7.7)
Neutrophils Relative %: 65 % (ref 43–77)
Platelets: 32 10*3/uL — ABNORMAL LOW (ref 150–400)
RBC: 2.98 MIL/uL — AB (ref 4.22–5.81)
RDW: 15.9 % — ABNORMAL HIGH (ref 11.5–15.5)
WBC: 3.7 10*3/uL — AB (ref 4.0–10.5)

## 2013-03-20 LAB — BASIC METABOLIC PANEL
BUN: 70 mg/dL — AB (ref 6–23)
CALCIUM: 7.4 mg/dL — AB (ref 8.4–10.5)
CO2: 21 meq/L (ref 19–32)
Chloride: 105 mEq/L (ref 96–112)
Creatinine, Ser: 4.41 mg/dL — ABNORMAL HIGH (ref 0.50–1.35)
GFR calc Af Amer: 14 mL/min — ABNORMAL LOW (ref >90)
GFR, EST NON AFRICAN AMERICAN: 12 mL/min — AB (ref >90)
Glucose, Bld: 79 mg/dL (ref 70–99)
Potassium: 4.4 mEq/L (ref 3.7–5.3)
Sodium: 137 mEq/L (ref 137–147)

## 2013-03-20 LAB — TYPE AND SCREEN
ABO/RH(D): A POS
ANTIBODY SCREEN: NEGATIVE
UNIT DIVISION: 0

## 2013-03-20 NOTE — Evaluation (Signed)
Physical Therapy Evaluation Patient Details Name: Scott [name] MRN: [id number] DOB: 06-Nov-1940 Today's Date: 03/20/2013 Time: 2409-7353 PT Time Calculation (min): 20 min  PT Assessment / Plan / Recommendation History of Present Illness  Patient is a 73 y.o. male with multiple myeloma admitted with hypokalemia and renal failure.  Clinical Impression  Patient presents with decreased safety and independence with mobility due to deficits listed below.  He will benefit from skilled PT in the acute setting to allow return home with family assist and HHPT.    PT Assessment  Patient needs continued PT services    Follow Up Recommendations  Home health PT    Does the patient have the potential to tolerate intense rehabilitation    N/A  Barriers to Discharge  None      Equipment Recommendations  Rolling walker with 5" wheels    Recommendations for Other Services OT consult   Frequency Min 3X/week    Precautions / Restrictions Precautions Precautions: Fall Precaution Comments: last fall 3 months ago in tub with reported medication reaction   Pertinent Vitals/Pain No pain complaints      Mobility  Bed Mobility Bed Mobility: Sit to Supine Sit to Supine: 5: Supervision;HOB flat Details for Bed Mobility Assistance: for safety with cues to scoot up to [facility] prior to reclining Transfers Transfers: Sit to Stand;Stand to Sit Sit to Stand: 5: Supervision;From bed Stand to Sit: 4: Min guard;To bed Details for Transfer Assistance: assist to sit due to pt unsafe sitting quickly due to fatigue Ambulation/Gait Ambulation/Gait Assistance: 4: Min assist;5: Supervision Ambulation Distance (Feet): 150 Feet Assistive device: Other (Comment);Rolling walker Ambulation/Gait Assistance Details: pushing IV pole first, then used rolling walker with cues to keep walker with him when backing up to bed Gait Pattern: Step-through pattern;Decreased stride length Stairs: Yes Stairs Assistance: 4: Min  assist Stairs Assistance Details (indicate cue type and reason): assist for safety due to pt unsteady with weakness Stair Management Technique: Forwards;Two rails;One rail Right Number of Stairs: 2        PT Diagnosis: Generalized weakness;Abnormality of gait  PT Problem List: Decreased strength;Decreased activity tolerance;Decreased balance;Decreased mobility;Decreased safety awareness;Decreased knowledge of use of DME PT Treatment Interventions: DME instruction;Gait training;Stair training;Functional mobility training;Patient/family education;Therapeutic activities;Therapeutic exercise;Balance training     PT Goals(Current goals can be found in the care plan section) Acute Rehab PT Goals Patient Stated Goal: To return home PT Goal Formulation: With patient/family Time For Goal Achievement: 04/03/13 Potential to Achieve Goals: Good  Visit Information  Last PT Received On: 03/20/13 Assistance Needed: +1 History of Present Illness: Patient is a 73 y.o. male with multiple myeloma admitted with hypokalemia and renal failure.       Prior [place] expects to be discharged to:: Private residence Living Arrangements: Spouse/significant other;Children Available Help at Discharge: Family Type of Home: House Home Access: Stairs to enter Technical brewer of Steps: 4 Entrance Stairs-Rails: Right;Left;Can reach both Home Layout: One level Home Equipment: None Prior Function Level of Independence: Independent Comments: reports often furniture walks or asks wife to assist; takes sponge baths Communication Communication: No difficulties    Cognition  Cognition Arousal/Alertness: Awake/alert Behavior During Therapy: WFL for tasks assessed/performed Overall Cognitive Status: Within Functional Limits for tasks assessed    Extremity/Trunk Assessment Lower Extremity Assessment Lower Extremity Assessment: Generalized weakness   Balance    End of  Session PT - End of Session Equipment Utilized During Treatment: Gait belt Activity Tolerance: Patient limited by fatigue Patient  left: in bed;with call bell/phone within reach;with family/visitor present  GP     [facility] 03/20/2013, 4:32 PM Magda Kiel, [place] 03/20/2013

## 2013-03-20 NOTE — Progress Notes (Signed)
Chart reviewed.   [facility] PROGRESS NOTE  Scott [name] EML:[id number] DOB: 04/29/1940 DOA: 03/17/2013 PCP: Scott Buttner, MD  Assessment/Plan:  Principal Problem: Acute on chronic renal failure, patient having a history of stage IV chronic kidney disease Likely secondary to prerenal azotemia and possibly nephrotoxicity from Bactrim contributing. Bactrim therapy has been discontinued. His family continues to improve, coming down to 4.414 4.6 on yesterday's lab work. Overall he seems to do much better today.  Hyperkalemia  Resolved, likely secondary to renal failure. Discontinue telemetry.  Pancytopenia: Discussed with heme onc at [facility], Dr. Hermenia Bers. She agrees with holding Revlimid to encase contributing. Patient was diagnosed with multiple myeloma in November. Received chemotherapy, first round last month. She feels pancytopenia likely at least in part related to chemotherapy. Bone marrow biopsy showed iron present. She had recommended Procrit, but is still trying to get this authorized with insurance. She has not yet checked anemia panel. Will order. Will also order Aranesp, as likely not iron deficient based on bone marrow biopsy.  Acute on chronic anemia Patient undergoing blood transfusion yesterday with one unit of packed red blood cells with his hemoglobin improved to 8.7 from 7.3 on yesterday's lab work. He reports feeling much better after transfusion.    Multiple myeloma Patient undergoing chemotherapy. Case was discussed with his hematologist Dr. Anabel Bene at [place] has been held.   Protein calorie malnutrition:  Patient with history of multiple myeloma, cachectic, with significant weight loss. Continue protein boost 3 times a day.  Deconditioning:  Physical therapy consultation  Code Status:  full Family Communication:  Wife at bedside Disposition Plan:  home  Consultants:  Nephrology consulted  yesterday.   HPI/Subjective: Patient states feeling better, we ambulated down the hallway to the nurses station and back. He is tolerating by mouth intake denies nausea vomiting fevers or chills  Objective: Filed Vitals:   03/20/13 1659  BP: 116/60  Pulse: 71  Temp:   Resp: 18    Intake/Output Summary (Last 24 hours) at 03/20/13 1849 Last data filed at 03/20/13 1829  Gross per 24 hour  Intake   1360 ml  Output    420 ml  Net    940 ml   Filed Weights   03/17/13 2147  Weight: 60.8 kg (134 lb 0.6 oz)    Exam:   General:  Elderly, cachectic, frail appearing.alert oriented and appropriate.  HEENT: Slightly dry mucous membranes  Cardiovascular: regular rate rhythm without murmurs gallops rubs  Respiratory: clear to auscultation bilaterally without wheezes rhonchi or rales  Abdomen: soft nontender nondistended  Ext: no clubbing cyanosis or edema  Basic Metabolic Panel:  Recent Labs Lab 03/17/13 1500 03/17/13 2031 03/17/13 2305 03/18/13 0500 03/19/13 0635 03/20/13 0900  NA 139  --  140 140 138 137  K 5.7*  --  5.1 5.3 4.4 4.4  CL 107  --  108 109 107 105  CO2 17*  --  14* 17* 18* 21  GLUCOSE 100*  --  122* 63* 74 79  BUN 94*  --  93* 91* 83* 70*  CREATININE 5.05*  --  4.98* 5.03* 4.76* 4.41*  CALCIUM 8.4  --  8.1* 7.7* 7.6* 7.4*  MG  --  2.2  --   --   --   --   PHOS  --  4.2  --   --   --   --    Liver Function Tests:  Recent Labs Lab 03/17/13 1500 03/17/13 2031 03/18/13 0500  AST 14 12 10   ALT 23 20 17   ALKPHOS 95 85 77  BILITOT 0.2* 0.2* <0.2*  PROT 6.3 5.9* 5.3*  ALBUMIN 2.9* 2.7* 2.4*   No results found for this basename: LIPASE, AMYLASE,  in the last 168 hours No results found for this basename: AMMONIA,  in the last 168 hours CBC:  Recent Labs Lab 03/17/13 1500 03/18/13 0500 03/19/13 0635 03/20/13 0900  WBC 3.0* 1.8* 2.0* 3.7*  NEUTROABS 1.8  --  1.0* 2.4  HGB 8.5* 7.0* 7.3* 8.7*  HCT 25.0* 20.4* 21.3* 25.6*  MCV 85.3 86.8  86.9 85.9  PLT 67* 50* 43* 32*   Cardiac Enzymes:  Recent Labs Lab 03/17/13 2031 03/17/13 2305 03/18/13 0500  TROPONINI <0.30 <0.30 <0.30   BNP (last 3 results) No results found for this basename: PROBNP,  in the last 8760 hours CBG: No results found for this basename: GLUCAP,  in the last 168 hours  No results found for this or any previous visit (from the past 240 hour(s)).   Studies: No results found.  Scheduled Meds: . acyclovir  400 mg Oral Daily  . amiodarone  200 mg Oral Daily  . feeding supplement (NEPRO CARB STEADY)  237 mL Oral TID BM  . feeding supplement (RESOURCE BREEZE)  1 Container Oral TID BM  . tamsulosin  0.4 mg Oral QPC breakfast   Continuous Infusions: . sodium chloride 100 mL/hr (03/20/13 0900)    Time spent: 35 minutes  [name]  [facility] Pager [phone]. If 7PM-7AM, please contact night-coverage at [email or web address], password [facility] 03/20/2013, 6:49 PM  LOS: 3 days

## 2013-03-20 NOTE — Clinical Documentation Improvement (Signed)
Possible Clinical Conditions?  Severe Malnutrition   Protein Calorie Malnutrition Severe Protein Calorie Malnutrition Other Condition Cannot clinically determine  Supporting Information: NUTRITION ASSESSMENT by Cleotis Lema, RD at 03/18/2013  2:26 PM Risk Factors: Signs & Symptoms: Diagnostics:NUTRITION DIAGNOSIS: Malnutrition related to chronic illness as evidenced by severe fat and muscle wasting, po intake </= 75% of his needs for >/= 1 month, and 29% wt loss x 1 year.  DOCUMENTATION CODES  Per approved criteria  -Severe malnutrition in the context of chronic illness   INTERVENTION: Resource Breeze po TID, each supplement provides 250 kcal and 9 grams of protein Continue Nepro TID   Thank You, Delena Serve, BSN, CCDS Clinical Documentation Specialist:  [phone]   [phone]=cell [place]- Health Information Management

## 2013-03-21 LAB — BASIC METABOLIC PANEL
BUN: 59 mg/dL — ABNORMAL HIGH (ref 6–23)
CHLORIDE: 106 meq/L (ref 96–112)
CO2: 20 mEq/L (ref 19–32)
CREATININE: 3.91 mg/dL — AB (ref 0.50–1.35)
Calcium: 7.4 mg/dL — ABNORMAL LOW (ref 8.4–10.5)
GFR calc non Af Amer: 14 mL/min — ABNORMAL LOW (ref >90)
GFR, EST AFRICAN AMERICAN: 17 mL/min — AB (ref >90)
GLUCOSE: 56 mg/dL — AB (ref 70–99)
Potassium: 4.5 mEq/L (ref 3.7–5.3)
Sodium: 137 mEq/L (ref 137–147)

## 2013-03-21 LAB — CBC
HCT: 26.3 % — ABNORMAL LOW (ref 39.0–52.0)
Hemoglobin: 9 g/dL — ABNORMAL LOW (ref 13.0–17.0)
MCH: 29.4 pg (ref 26.0–34.0)
MCHC: 34.2 g/dL (ref 30.0–36.0)
MCV: 85.9 fL (ref 78.0–100.0)
Platelets: 32 10*3/uL — ABNORMAL LOW (ref 150–400)
RBC: 3.06 MIL/uL — ABNORMAL LOW (ref 4.22–5.81)
RDW: 16.1 % — AB (ref 11.5–15.5)
WBC: 4.5 10*3/uL (ref 4.0–10.5)

## 2013-03-21 MED ORDER — ENSURE COMPLETE PO LIQD: 237.0000 mL | ORAL | Status: DC

## 2013-03-21 NOTE — Progress Notes (Addendum)
NUTRITION FOLLOW-UP  DOCUMENTATION CODES Per approved criteria  -Severe malnutrition in the context of chronic illness   INTERVENTION: Discontinue Resource Breeze and Nepro Shakes TID. Pt is refusing. Add Ensure Complete po daily, each supplement provides 350 kcal and 13 grams of protein. Consider appetite stimulant. If oral intake does not improve and aggressive nutrition therapy desired, recommend initiation of nutrition support. RD to continue to follow nutrition care plan.  NUTRITION DIAGNOSIS: Malnutrition related to chronic illness as evidenced by severe fat and muscle wasting, po intake </= 75% of his needs for >/= 1 month, and 29% wt loss x 1 year.   Goal: Pt to meet >/= 90% of their estimated nutrition needs - unmet,  Monitor:  PO intake, supplement acceptance, weight trend, labs   ASSESSMENT: Pt recently dx with multiple myeloma currently being treated at the [facility], who was sent to the ED because of unavailability of nephrology services at [facility].   Pt started chemo beginning of December 2014, since then pt's intake has been decreased. Pt with ARF, medications d/c'ed. Per MD note no indication for HD at this time.   Currently ordered for a Regular diet. Meal intake varies, ranging from 25 - 75%. Ordered for Resource Breeze TID and Nepro Shake TID, per RN and patient, pt is refusing supplements. He states that he cannot tolerate them and that they make him vomit. Was drinking Ensure Complete daily and is agreeable to switching out his supplements for one Ensure a day. Continues to note taste changes and poor appetite.  Height: Ht Readings from Last 1 Encounters:  03/17/13 5' 5" (1.651 m)    Weight: Wt Readings from Last 1 Encounters:  03/20/13 134 lb 1.6 oz (60.827 kg)  Admit wt 134 lb  BMI:  Body mass index is 22.32 kg/(m^2). Normal weight  Estimated Nutritional Needs: Kcal: 1850-2050 Protein: 90-110 grams Fluid: > 1.9 L/day  Skin:  no issues noted  Diet Order: General  EDUCATION NEEDS: -No education needs identified at this time   Intake/Output Summary (Last 24 hours) at 03/21/13 1213 Last data filed at 03/21/13 0600  Gross per 24 hour  Intake   2420 ml  Output    550 ml  Net   1870 ml    Last BM: 1/4  Labs:   Recent Labs Lab 03/17/13 1500 03/17/13 2031  03/19/13 0635 03/20/13 0900 03/21/13 0512  NA 139  --   < > 138 137 137  K 5.7*  --   < > 4.4 4.4 4.5  CL 107  --   < > 107 105 106  CO2 17*  --   < > 18* 21 20  BUN 94*  --   < > 83* 70* 59*  CREATININE 5.05*  --   < > 4.76* 4.41* 3.91*  CALCIUM 8.4  --   < > 7.6* 7.4* 7.4*  MG  --  2.2  --   --   --   --   PHOS  --  4.2  --   --   --   --   GLUCOSE 100*  --   < > 74 79 56*  < > = values in this interval not displayed.  CBG (last 3)  No results found for this basename: GLUCAP,  in the last 72 hours  Scheduled Meds: . acyclovir  400 mg Oral Daily  . amiodarone  200 mg Oral Daily  . feeding supplement (NEPRO CARB STEADY)  237 mL Oral   TID BM  . feeding supplement (RESOURCE BREEZE)  1 Container Oral TID BM  . tamsulosin  0.4 mg Oral QPC breakfast    Continuous Infusions: . sodium chloride 100 mL/hr at 03/20/13 2300    Samantha Worley MS, RD, LDN Pager: [phone] After-hours pager: [phone]    

## 2013-03-21 NOTE — Progress Notes (Signed)
   CARE MANAGEMENT NOTE 03/21/2013  Patient:  Scott [name], Scott [name]   Account Number:  [id number]  Date Initiated:  03/21/2013  Documentation initiated by:  Lizabeth Leyden  Subjective/Objective Assessment:   admitted with hyperkalemia, AKI, active with chemo for multiple myeloma.     Action/Plan:   progression of care and discharge planning  PT eval   Anticipated DC Date:  03/21/2013   Anticipated DC Plan:  [place]  CM consult      [facility] Choice  HOME HEALTH   Choice offered to / List presented to:  C-1 Patient        [place] arranged  [place] [facility]   Status of service:  Completed, signed off Medicare Important Message given?   (If response is "NO", the following Medicare IM given date fields will be blank) Date Medicare IM given:   Date Additional Medicare IM given:    Discharge Disposition:  [place]  Per UR Regulation:    If discussed at Long Length of Stay Meetings, dates discussed:    Comments:  03/21/2013  [place], [place]  Met with patient and spouse regarding discharge planning if MD orders home health services. [facility] selected.  [facility] [phone] spoke with Hoyle Sauer with referral for home health PT and aide Faxed information.

## 2013-03-21 NOTE — Progress Notes (Signed)
Chart reviewed.   [facility] PROGRESS NOTE  Scott [name] TXM:[id number] DOB: 20-May-1940 DOA: 03/17/2013 PCP: Bufford Buttner, MD  Assessment/Plan:  Principal Problem: Acute on chronic renal failure, patient having a history of stage IV chronic kidney disease Likely secondary to prerenal azotemia and possibly nephrotoxicity from Bactrim contributing. Bactrim therapy has been discontinued. His creatinine slightly improved from 5.03 on 03/18/2013 to 4.76 03/20/13. He remains on IV fluids.   Hyperkalemia  Resolved, likely secondary to renal failure. Discontinue telemetry.  Pancytopenia: Discussed with heme onc at [facility], Dr. Hermenia Bers. She agrees with holding Revlimid to encase contributing. Patient was diagnosed with multiple myeloma in November. Received chemotherapy, first round last month. She feels pancytopenia likely at least in part related to chemotherapy. Bone marrow biopsy showed iron present. She had recommended Procrit, but is still trying to get this authorized with insurance. She has not yet checked anemia panel. Will order. Will also order Aranesp, as likely not iron deficient based on bone marrow biopsy.  Acute on chronic anemia Patient reporting generalized weakness which in part is likely secondary to underlying malignancy. He is anemic with a hemoglobin at 7.3 this morning. We'll transfuse one unit of packed red blood cells today.    Multiple myeloma Patient undergoing chemotherapy. Case was discussed with his hematologist Dr. Anabel Bene at [place] has been held.   Protein calorie malnutrition:  Patient with history of multiple myeloma, cachectic, with significant weight loss. Continue protein boost 3 times a day.  Deconditioning:  Physical therapy consultation  Code Status:  full Family Communication:  Wife at bedside Disposition Plan:  home   HPI/Subjective: Patient reports feeling better  Objective: Filed Vitals:   03/21/13  1232  BP: 128/67  Pulse: 71  Temp: 97.3 F (36.3 C)  Resp: 16    Intake/Output Summary (Last 24 hours) at 03/21/13 1512 Last data filed at 03/21/13 1300  Gross per 24 hour  Intake   2540 ml  Output    600 ml  Net   1940 ml   Filed Weights   03/17/13 2147 03/20/13 2034  Weight: 60.8 kg (134 lb 0.6 oz) 60.827 kg (134 lb 1.6 oz)    Exam:   General:  Elderly, cachectic, frail appearing.alert oriented and appropriate.  HEENT: Slightly dry mucous membranes  Cardiovascular: regular rate rhythm without murmurs gallops rubs  Respiratory: clear to auscultation bilaterally without wheezes rhonchi or rales  Abdomen: soft nontender nondistended  Ext: no clubbing cyanosis or edema  Basic Metabolic Panel:  Recent Labs Lab 03/17/13 1500 03/17/13 2031 03/17/13 2305 03/18/13 0500 03/19/13 0635 03/20/13 0900 03/21/13 0512  NA 139  --  140 140 138 137 137  K 5.7*  --  5.1 5.3 4.4 4.4 4.5  CL 107  --  108 109 107 105 106  CO2 17*  --  14* 17* 18* 21 20  GLUCOSE 100*  --  122* 63* 74 79 56*  BUN 94*  --  93* 91* 83* 70* 59*  CREATININE 5.05*  --  4.98* 5.03* 4.76* 4.41* 3.91*  CALCIUM 8.4  --  8.1* 7.7* 7.6* 7.4* 7.4*  MG  --  2.2  --   --   --   --   --   PHOS  --  4.2  --   --   --   --   --    Liver Function Tests:  Recent Labs Lab 03/17/13 1500 03/17/13 2031 03/18/13 0500  AST 14 12 10   ALT  23 20 17   ALKPHOS 95 85 77  BILITOT 0.2* 0.2* <0.2*  PROT 6.3 5.9* 5.3*  ALBUMIN 2.9* 2.7* 2.4*   No results found for this basename: LIPASE, AMYLASE,  in the last 168 hours No results found for this basename: AMMONIA,  in the last 168 hours CBC:  Recent Labs Lab 03/17/13 1500 03/18/13 0500 03/19/13 0635 03/20/13 0900 03/21/13 0512  WBC 3.0* 1.8* 2.0* 3.7* 4.5  NEUTROABS 1.8  --  1.0* 2.4  --   HGB 8.5* 7.0* 7.3* 8.7* 9.0*  HCT 25.0* 20.4* 21.3* 25.6* 26.3*  MCV 85.3 86.8 86.9 85.9 85.9  PLT 67* 50* 43* 32* 32*   Cardiac Enzymes:  Recent Labs Lab  03/17/13 2031 03/17/13 2305 03/18/13 0500  TROPONINI <0.30 <0.30 <0.30   BNP (last 3 results) No results found for this basename: PROBNP,  in the last 8760 hours CBG: No results found for this basename: GLUCAP,  in the last 168 hours  No results found for this or any previous visit (from the past 240 hour(s)).   Studies: No results found.  Scheduled Meds: . acyclovir  400 mg Oral Daily  . amiodarone  200 mg Oral Daily  . feeding supplement (ENSURE COMPLETE)  237 mL Oral Q24H  . tamsulosin  0.4 mg Oral QPC breakfast   Continuous Infusions: . sodium chloride 100 mL/hr at 03/20/13 2300    Time spent: 35 minutes  [name]  [facility] Pager [phone]. If 7PM-7AM, please contact night-coverage at [email or web address], password [facility] 03/21/2013, 3:12 PM  LOS: 4 days

## 2013-03-21 NOTE — Discharge Summary (Signed)
Physician Discharge Summary  Scott [name] KGM:[id number] DOB: 21-Feb-1941 DOA: 03/17/2013  PCP: Bufford Buttner, MD  Admit date: 03/17/2013 Discharge date: 03/21/2013  Time spent:   Recommendations for Outpatient Follow-up:  1. Please obtain a CBC and BMP on hospital follow-up  Discharge Diagnoses:  Principal Problem:   Acute renal failure Active Problems:   Hyperkalemia   Other pancytopenia   Multiple myeloma   Chronic kidney disease (CKD), stage IV (severe)   Metabolic acidosis   Hypoglycemia   Protein-calorie malnutrition, severe   Discharge Condition: Stable/Improved  Diet recommendation: Regular diet  Filed Weights   03/17/13 2147 03/20/13 2034  Weight: 60.8 kg (134 lb 0.6 oz) 60.827 kg (134 lb 1.6 oz)    History of present illness:  73 year old male with a history of multiple myeloma currently being treated by Derwood Kaplan, MD, at the [facility], who was sent to the ED because of unavailability of nephrology services at [facility]. Patient has a baseline creatinine of around 3. His creatinine was found to be 5 at the cancer treatment Center.  The patient recently started chemotherapy 3 weeks ago. Since then the patient's by mouth intake has been decreased. He appears dehydrated. His lips are dry, he reports decreased oral intake. He has been more sleepy.  He is currently getting injections biweekly and taking revlimid for multiple myeloma  EDP checked troponin but the patient does not report any cardiopulmonary symptoms. No chest pain  Hospital Course:  Patient is a pleasant 73 year old gentleman with a past medical history of multiple myeloma, currently following Dr.Christy Hinton Rao of medical oncology at [facility], who had been on Revlimid therapy prior to this hospitalization, receiving first round of chemotherapy in December, presented as a transfer from [facility] on 03/17/2013 for elevated creatinine. Labs were  checked at his [place], found to have a creatinine of 5 where it appears his baseline creatinine is near 3. He was transferred to [facility] for further evaluation and treatment. Dr. Conley Canal spoke with Dr Hinton Rao who agreed with holding Revlimid. Patient was started on IV fluid resuscitation with normal saline as prerenal azotemia/hypovolemia was felt to be contributing to his acute on chronic renal failure. CBC showed acute on chronic anemia with a hemoglobin of 7.0. Patient had reported generalized weakness and fatigue with poor tolerance to physical exertion for which she was typed and crossed and transfused with one unit of packed red blood cells. He progressively got better as his creatinine trended down to 3.91 by 03/21/2013. Physical therapy was consulted and recommended home health services with home PT. Face-to-face for and has been completed on at take for home health services with home PT. Plan to continue one more day of IV fluid resuscitation, followup on a.m. lab work. Hopefully if lab work continue to improve and he remained stable, may be discharged in the next 24 hours. This morning patient reported feeling better, tolerating by mouth intake, hemodynamically stable, thinks he can go home soon.   Consultations:  Social work  Discharge Exam: Filed Vitals:   03/21/13 1232  BP: 128/67  Pulse: 71  Temp: 97.3 F (36.3 C)  Resp: 16    General: Patient appears better, awake alert, however he does appear chronically ill and cachectic Cardiovascular: Regular rate rhythm normal S1-S2 no murmurs rubs or gallops Respiratory: Lungs are clear to auscultation bilaterally no wheezing rhonchi or rales Abdomen: Soft nontender nondistended  Discharge Instructions     Medication List  ASK your doctor about these medications       acyclovir 400 MG tablet  Commonly known as:  ZOVIRAX  Take 400 mg by mouth daily.     amiodarone 200 MG tablet  Commonly known as:  PACERONE   Take 200 mg by mouth daily.     aspirin 325 MG tablet  Take 325 mg by mouth daily.     dexamethasone 4 MG tablet  Commonly known as:  DECADRON  Take 40 mg by mouth daily. Every Tuesday.     lenalidomide 10 MG capsule  Commonly known as:  REVLIMID  Take 10 mg by mouth daily.     sulfamethoxazole-trimethoprim 800-160 MG per tablet  Commonly known as:  BACTRIM DS  Take 1 tablet by mouth 2 (two) times daily.     tamsulosin 0.4 MG Caps capsule  Commonly known as:  FLOMAX  Take 0.4 mg by mouth daily after breakfast.       No Known Allergies    The results of significant diagnostics from this hospitalization (including imaging, microbiology, ancillary and laboratory) are listed below for reference.    Significant Diagnostic Studies: Dg Chest 2 View  03/17/2013   CLINICAL DATA:  History of osteomyelitis and renal failure, no chest complaints  EXAM: CHEST  2 VIEW  COMPARISON:  02/03/2013  FINDINGS: Heart size is normal. Vascular pattern is normal. Lungs are clear. Cardiac pacer is seen in unchanged position.  IMPRESSION: No active cardiopulmonary disease.   Electronically Signed   By: Skipper Cliche M.D.   On: 03/17/2013 21:09    Microbiology: No results found for this or any previous visit (from the past 240 hour(s)).   Labs: Basic Metabolic Panel:  Recent Labs Lab 03/17/13 1500 03/17/13 2031 03/17/13 2305 03/18/13 0500 03/19/13 0635 03/20/13 0900 03/21/13 0512  NA 139  --  140 140 138 137 137  K 5.7*  --  5.1 5.3 4.4 4.4 4.5  CL 107  --  108 109 107 105 106  CO2 17*  --  14* 17* 18* 21 20  GLUCOSE 100*  --  122* 63* 74 79 56*  BUN 94*  --  93* 91* 83* 70* 59*  CREATININE 5.05*  --  4.98* 5.03* 4.76* 4.41* 3.91*  CALCIUM 8.4  --  8.1* 7.7* 7.6* 7.4* 7.4*  MG  --  2.2  --   --   --   --   --   PHOS  --  4.2  --   --   --   --   --    Liver Function Tests:  Recent Labs Lab 03/17/13 1500 03/17/13 2031 03/18/13 0500  AST 14 12 10   ALT 23 20 17   ALKPHOS 95 85  77  BILITOT 0.2* 0.2* <0.2*  PROT 6.3 5.9* 5.3*  ALBUMIN 2.9* 2.7* 2.4*   No results found for this basename: LIPASE, AMYLASE,  in the last 168 hours No results found for this basename: AMMONIA,  in the last 168 hours CBC:  Recent Labs Lab 03/17/13 1500 03/18/13 0500 03/19/13 0635 03/20/13 0900 03/21/13 0512  WBC 3.0* 1.8* 2.0* 3.7* 4.5  NEUTROABS 1.8  --  1.0* 2.4  --   HGB 8.5* 7.0* 7.3* 8.7* 9.0*  HCT 25.0* 20.4* 21.3* 25.6* 26.3*  MCV 85.3 86.8 86.9 85.9 85.9  PLT 67* 50* 43* 32* 32*   Cardiac Enzymes:  Recent Labs Lab 03/17/13 2031 03/17/13 2305 03/18/13 0500  TROPONINI <0.30 <0.30 <0.30   BNP: BNP (  last 3 results) No results found for this basename: PROBNP,  in the last 8760 hours CBG: No results found for this basename: GLUCAP,  in the last 168 hours     Signed:  [name]  [facility] 03/21/2013, 3:01 PM

## 2013-03-21 NOTE — Progress Notes (Signed)
Patient high fall risk, refusing bed alarm.  Fall risk education done with wife & patient.

## 2013-03-22 LAB — BASIC METABOLIC PANEL
BUN: 52 mg/dL — ABNORMAL HIGH (ref 6–23)
CALCIUM: 7.5 mg/dL — AB (ref 8.4–10.5)
CO2: 19 mEq/L (ref 19–32)
CREATININE: 3.69 mg/dL — AB (ref 0.50–1.35)
Chloride: 114 mEq/L — ABNORMAL HIGH (ref 96–112)
GFR, EST AFRICAN AMERICAN: 18 mL/min — AB (ref >90)
GFR, EST NON AFRICAN AMERICAN: 15 mL/min — AB (ref >90)
GLUCOSE: 103 mg/dL — AB (ref 70–99)
POTASSIUM: 4.3 meq/L (ref 3.7–5.3)
Sodium: 144 mEq/L (ref 137–147)

## 2013-03-22 MED ORDER — ENSURE COMPLETE PO LIQD: 237.0000 mL | ORAL | Status: DC

## 2013-03-22 NOTE — Progress Notes (Signed)
   CARE MANAGEMENT NOTE 03/22/2013  Patient:  Scott [name], Scott [name]   Account Number:  [id number]  Date Initiated:  03/21/2013  Documentation initiated by:  Lizabeth Leyden  Subjective/Objective Assessment:   admitted with hyperkalemia, AKI, active with chemo for multiple myeloma.     Action/Plan:   progression of care and discharge planning  PT eval   Anticipated DC Date:  03/22/2013   Anticipated DC Plan:  [place]  CM consult      [facility] Choice  HOME HEALTH   Choice offered to / List presented to:  C-1 Patient        [place] arranged  [place] [facility]   Status of service:  Completed, signed off Medicare Important Message given?   (If response is "NO", the following Medicare IM given date fields will be blank) Date Medicare IM given:   Date Additional Medicare IM given:    Discharge Disposition:  [place]  Per UR Regulation:    If discussed at Long Length of Stay Meetings, dates discussed:    Comments:  03/22/2013 [place] [place] [facility]: faxed copy of discharge summary  03/21/2013  [place], [place]  Met with patient and spouse regarding discharge planning if MD orders home health services. Selected [facility] selected.  [facility] [phone] spoke with Hoyle Sauer with referral for home health PT and aide Faxed information.

## 2013-03-22 NOTE — Discharge Summary (Addendum)
PATIENT DETAILS Name: Scott [name] Age: 73 y.o. Sex: male Date of Birth: [birth year] MRN: [id number]. Admit Date: 03/17/2013 Admitting Physician: Reyne Dumas, MD QIO:[id number],LKGMWN, MD  Recommendations for Outpatient Follow-up:  1. Please check CBC and renal function at next visit with PCP or primary oncologist. 2. Bactrim and Revlimid are currently on hold-resume when able  PRIMARY DISCHARGE DIAGNOSIS:  Principal Problem:   Acute renal failure Active Problems:   Hyperkalemia   Other pancytopenia   Multiple myeloma   Chronic kidney disease (CKD), stage IV (severe)   Metabolic acidosis   Hypoglycemia   Protein-calorie malnutrition, severe      PAST MEDICAL HISTORY: Past Medical History  Diagnosis Date  . Renal disorder   . Pacemaker   . Multiple myeloma   . Skin cancer of face     "once; left side of my face" (03/17/2013)  . Shortness of breath     "when I take one of my chemo pills" (03/17/2013)  . Daily headache   . Arthritis     "little in my right shoulder" (03/17/2013)    DISCHARGE MEDICATIONS:   Medication List    STOP taking these medications       aspirin 325 MG tablet     lenalidomide 10 MG capsule  Commonly known as:  REVLIMID     sulfamethoxazole-trimethoprim 800-160 MG per tablet  Commonly known as:  BACTRIM DS      TAKE these medications       acyclovir 400 MG tablet  Commonly known as:  ZOVIRAX  Take 400 mg by mouth daily.     amiodarone 200 MG tablet  Commonly known as:  PACERONE  Take 200 mg by mouth daily.     dexamethasone 4 MG tablet  Commonly known as:  DECADRON  Take 40 mg by mouth daily. Every Tuesday.     feeding supplement (ENSURE COMPLETE) Liqd  Take 237 mLs by mouth daily.     tamsulosin 0.4 MG Caps capsule  Commonly known as:  FLOMAX  Take 0.4 mg by mouth daily after breakfast.        ALLERGIES:  No Known Allergies  BRIEF HPI:  See H&P, Labs, Consult and Test reports for all details in brief, patient is a  unfortunate 73 year old male with a history of multiple myeloma being treated by Dr. Anabel Bene in [place], was sent to the ED after his creatinine was found to be 5. Apparently patient started chemotherapy 3 weeks back, since then patient has had a decreased by mouth intake and appeared dehydrated. He was then admitted for further evaluation and treatment  CONSULTATIONS:   None  PERTINENT RADIOLOGIC STUDIES: Dg Chest 2 View  03/17/2013   CLINICAL DATA:  History of osteomyelitis and renal failure, no chest complaints  EXAM: CHEST  2 VIEW  COMPARISON:  02/03/2013  FINDINGS: Heart size is normal. Vascular pattern is normal. Lungs are clear. Cardiac pacer is seen in unchanged position.  IMPRESSION: No active cardiopulmonary disease.   Electronically Signed   By: Skipper Cliche M.D.   On: 03/17/2013 21:09     PERTINENT LAB RESULTS: CBC:  Recent Labs  03/20/13 0900 03/21/13 0512  WBC 3.7* 4.5  HGB 8.7* 9.0*  HCT 25.6* 26.3*  PLT 32* 32*   CMET CMP     Component Value Date/Time   NA 144 03/22/2013 0920   K 4.3 03/22/2013 0920   CL 114* 03/22/2013 0920   CO2 19 03/22/2013 0920   GLUCOSE 103*  03/22/2013 0920   BUN 52* 03/22/2013 0920   CREATININE 3.69* 03/22/2013 0920   CALCIUM 7.5* 03/22/2013 0920   PROT 5.3* 03/18/2013 0500   ALBUMIN 2.4* 03/18/2013 0500   AST 10 03/18/2013 0500   ALT 17 03/18/2013 0500   ALKPHOS 77 03/18/2013 0500   BILITOT <0.2* 03/18/2013 0500   GFRNONAA 15* 03/22/2013 0920   GFRAA 18* 03/22/2013 0920    GFR Estimated Creatinine Clearance: 16.2 ml/min (by C-G formula based on Cr of 3.69). No results found for this basename: LIPASE, AMYLASE,  in the last 72 hours No results found for this basename: CKTOTAL, CKMB, CKMBINDEX, TROPONINI,  in the last 72 hours No components found with this basename: POCBNP,  No results found for this basename: DDIMER,  in the last 72 hours No results found for this basename: HGBA1C,  in the last 72 hours No results found for this basename:  CHOL, HDL, LDLCALC, TRIG, CHOLHDL, LDLDIRECT,  in the last 72 hours No results found for this basename: TSH, T4TOTAL, FREET3, T3FREE, THYROIDAB,  in the last 72 hours No results found for this basename: VITAMINB12, FOLATE, FERRITIN, TIBC, IRON, RETICCTPCT,  in the last 72 hours Coags: No results found for this basename: PT, INR,  in the last 72 hours Microbiology: No results found for this or any previous visit (from the past 240 hour(s)).   BRIEF HOSPITAL COURSE:   Patient is a pleasant 73 year old gentleman with a past medical history of multiple myeloma, currently following Dr.Christy Hinton Rao of medical oncology at [facility], who had been on Revlimid therapy prior to this hospitalization, receiving first round of chemotherapy in December, presented as a transfer from [facility] on 03/17/2013 for elevated creatinine. Labs were checked at his [place], found to have a creatinine of 5 where it appears his baseline creatinine is near 3. He was transferred to [facility] for further evaluation and treatment. Dr. Conley Canal spoke with Dr Hinton Rao who agreed with holding Revlimid. Patient was started on IV fluid resuscitation with normal saline as prerenal azotemia/hypovolemia was felt to be contributing to his acute on chronic renal failure. CBC showed acute on chronic anemia with a hemoglobin of 7.0. Patient had reported generalized weakness and fatigue with poor tolerance to physical exertion for which she was typed and crossed and transfused with one unit of packed red blood cells. He progressively got better as his creatinine trended down to 3.69 by 03/22/2013. Physical therapy was consulted and recommended home health services with home PT. This morning patient reported feeling better, tolerating by mouth intake, hemodynamically stable, thinks he can go home today.  Patient has chronic, cytopenia, along with pancytopenia baseline. He has known history of multiple  myeloma. He has followup appointment with his primary oncologist Dr. Anabel Bene on 03/24/13, patient will keep this appointment. Suggest that patient gets a repeat chemistry panel and a CBC at this appointment.  This M.D., also spoke with patient's primary oncologist-Dr. Anabel Bene over the phone on 1/7 and informed her that the patient would a followup CBC and chemistries at his appointment on 1/9.   Plan at discharge was discussed with the patient and the patient's spouse at bedside, they were agreeable.    TODAY-DAY OF DISCHARGE:  Subjective:   Scott [name] today has no headache,no chest abdominal pain,no new weakness tingling or numbness, feels much better wants to go home today.   Objective:   Blood pressure 120/62, pulse 74, temperature 97.1 F (36.2 C), temperature source Oral, resp.  rate 18, height 5' 5"  (1.651 m), weight 61.508 kg (135 lb 9.6 oz), SpO2 98.00%.  Intake/Output Summary (Last 24 hours) at 03/22/13 1059 Last data filed at 03/22/13 0900  Gross per 24 hour  Intake   1620 ml  Output   1000 ml  Net    620 ml   Filed Weights   03/17/13 2147 03/20/13 2034 03/21/13 2105  Weight: 60.8 kg (134 lb 0.6 oz) 60.827 kg (134 lb 1.6 oz) 61.508 kg (135 lb 9.6 oz)    Exam Awake Alert, Oriented *3, No new F.N deficits, Normal affect [place].AT,PERRAL Supple Neck,No JVD, No cervical lymphadenopathy appriciated.  Symmetrical Chest wall movement, Good air movement bilaterally, CTAB RRR,No Gallops,Rubs or new Murmurs, No Parasternal Heave +ve B.Sounds, Abd Soft, Non tender, No organomegaly appriciated, No rebound -guarding or rigidity. No Cyanosis, Clubbing or edema, No new Rash or bruise  DISCHARGE CONDITION: Stable  DISPOSITION: Home with home health services   DISCHARGE INSTRUCTIONS:    Activity:  As tolerated   Diet recommendation: Heart Healthy diet   Discharge Orders   Future Orders Complete By Expires   Call MD for:  difficulty breathing, headache or visual  disturbances  As directed    Call MD for:  persistant nausea and vomiting  As directed    Call MD for:  redness, tenderness, or signs of infection (pain, swelling, redness, odor or green/yellow discharge around incision site)  As directed    Call MD for:  temperature >100.4  As directed    Diet - low sodium heart healthy  As directed    Increase activity slowly  As directed       Follow-up Information   Follow up with Bufford Buttner, MD. Schedule an appointment as soon as possible for a visit in 1 week.   Specialty:  Family Medicine   Contact information:   [place] [address] [phone]       Follow up with MCCARTHY,CHRISTINE On 03/24/2013. (keep this appointment)      Total Time spent on discharge equals 45 minutes.  SignedOren Binet 03/22/2013 10:59 AM

## 2013-03-22 NOTE — Progress Notes (Signed)
Patient discharge teaching given, including activity, diet, follow-up appoints, and medications. Patient verbalized understanding of all discharge instructions. IV access was d/c'd. Vitals are stable. Skin is intact except as charted in most recent assessments. Pt to be escorted out by NT, to be driven home by family.  Scott [name], MBA, BS, RN 

## 2013-06-08 ENCOUNTER — Inpatient Hospital Stay (HOSPITAL_COMMUNITY)
Admission: EM | Admit: 2013-06-08 | Discharge: 2013-06-10 | DRG: 193 | Disposition: A | Discharge status: Home / Self Care | Payer: Medicare HMO | Attending: Internal Medicine | Admitting: Internal Medicine

## 2013-06-08 ENCOUNTER — Encounter (HOSPITAL_COMMUNITY): Payer: Self-pay | Admitting: Emergency Medicine

## 2013-06-08 ENCOUNTER — Emergency Department (HOSPITAL_COMMUNITY): Payer: Medicare HMO

## 2013-06-08 DIAGNOSIS — Z950 Presence of cardiac pacemaker: Secondary | ICD-10-CM | POA: Diagnosis present

## 2013-06-08 DIAGNOSIS — J189 Pneumonia, unspecified organism: Principal | ICD-10-CM | POA: Diagnosis present

## 2013-06-08 DIAGNOSIS — Z85820 Personal history of malignant melanoma of skin: Secondary | ICD-10-CM

## 2013-06-08 DIAGNOSIS — E872 Acidosis, unspecified: Secondary | ICD-10-CM

## 2013-06-08 DIAGNOSIS — C9000 Multiple myeloma not having achieved remission: Secondary | ICD-10-CM | POA: Diagnosis present

## 2013-06-08 DIAGNOSIS — I4891 Unspecified atrial fibrillation: Secondary | ICD-10-CM

## 2013-06-08 DIAGNOSIS — R0602 Shortness of breath: Secondary | ICD-10-CM | POA: Diagnosis present

## 2013-06-08 DIAGNOSIS — E162 Hypoglycemia, unspecified: Secondary | ICD-10-CM

## 2013-06-08 DIAGNOSIS — N184 Chronic kidney disease, stage 4 (severe): Secondary | ICD-10-CM | POA: Diagnosis present

## 2013-06-08 DIAGNOSIS — E43 Unspecified severe protein-calorie malnutrition: Secondary | ICD-10-CM | POA: Diagnosis present

## 2013-06-08 DIAGNOSIS — E875 Hyperkalemia: Secondary | ICD-10-CM

## 2013-06-08 DIAGNOSIS — D899 Disorder involving the immune mechanism, unspecified: Secondary | ICD-10-CM | POA: Diagnosis present

## 2013-06-08 DIAGNOSIS — F17200 Nicotine dependence, unspecified, uncomplicated: Secondary | ICD-10-CM | POA: Diagnosis present

## 2013-06-08 DIAGNOSIS — M19019 Primary osteoarthritis, unspecified shoulder: Secondary | ICD-10-CM | POA: Diagnosis present

## 2013-06-08 DIAGNOSIS — N179 Acute kidney failure, unspecified: Secondary | ICD-10-CM

## 2013-06-08 DIAGNOSIS — R5381 Other malaise: Secondary | ICD-10-CM | POA: Diagnosis present

## 2013-06-08 DIAGNOSIS — IMO0002 Reserved for concepts with insufficient information to code with codable children: Secondary | ICD-10-CM

## 2013-06-08 DIAGNOSIS — D61818 Other pancytopenia: Secondary | ICD-10-CM

## 2013-06-08 HISTORY — DX: Unspecified protein-calorie malnutrition: E46

## 2013-06-08 HISTORY — DX: Pneumonia, unspecified organism: J18.9

## 2013-06-08 LAB — BASIC METABOLIC PANEL
BUN: 81 mg/dL — ABNORMAL HIGH (ref 6–23)
CALCIUM: 8.8 mg/dL (ref 8.4–10.5)
CHLORIDE: 103 meq/L (ref 96–112)
CO2: 22 meq/L (ref 19–32)
Creatinine, Ser: 3.49 mg/dL — ABNORMAL HIGH (ref 0.50–1.35)
GFR calc Af Amer: 19 mL/min — ABNORMAL LOW (ref >90)
GFR calc non Af Amer: 16 mL/min — ABNORMAL LOW (ref >90)
GLUCOSE: 101 mg/dL — AB (ref 70–99)
Potassium: 3.8 mEq/L (ref 3.7–5.3)
SODIUM: 142 meq/L (ref 137–147)

## 2013-06-08 LAB — CBC
HCT: 30.6 % — ABNORMAL LOW (ref 39.0–52.0)
HEMOGLOBIN: 10.4 g/dL — AB (ref 13.0–17.0)
MCH: 30.4 pg (ref 26.0–34.0)
MCHC: 34 g/dL (ref 30.0–36.0)
MCV: 89.5 fL (ref 78.0–100.0)
PLATELETS: 117 10*3/uL — AB (ref 150–400)
RBC: 3.42 MIL/uL — AB (ref 4.22–5.81)
RDW: 15.7 % — ABNORMAL HIGH (ref 11.5–15.5)
WBC: 14.2 10*3/uL — AB (ref 4.0–10.5)

## 2013-06-08 LAB — I-STAT TROPONIN, ED: TROPONIN I, POC: 0.05 ng/mL (ref 0.00–0.08)

## 2013-06-08 LAB — PRO B NATRIURETIC PEPTIDE: PRO B NATRI PEPTIDE: 16467 pg/mL — AB (ref 0–125)

## 2013-06-08 MED ORDER — VANCOMYCIN HCL 10 G IV SOLR
1250.0000 mg | Freq: Once | INTRAVENOUS | Status: DC
  Filled 2013-06-08 (×2): qty 1250

## 2013-06-08 MED ORDER — VANCOMYCIN HCL IN DEXTROSE 1-5 GM/200ML-% IV SOLN: 1000.0000 mg | INTRAVENOUS | Status: DC

## 2013-06-08 MED ORDER — SODIUM CHLORIDE 0.9 % IV SOLN
Freq: Once | INTRAVENOUS | Status: AC
  Administered 2013-06-09: via INTRAVENOUS

## 2013-06-08 MED ORDER — PIPERACILLIN-TAZOBACTAM IN DEX 2-0.25 GM/50ML IV SOLN
2.2500 g | Freq: Once | INTRAVENOUS | Status: AC
  Administered 2013-06-09: 2.25 g via INTRAVENOUS
  Filled 2013-06-08: qty 50

## 2013-06-08 NOTE — ED Notes (Signed)
Pt states that he has been having SOB today and was told by his PCP to come to the ED because "He might have some fluid on his lungs."  Family states recent DX of melanoma

## 2013-06-08 NOTE — ED Provider Notes (Addendum)
CSN: [id number]     Arrival date & time 06/08/13  2127 History   First MD Initiated Contact with Patient 06/08/13 2258     Chief Complaint  Patient presents with  . Shortness of Breath     (Consider location/radiation/quality/duration/timing/severity/associated sxs/prior Treatment) Patient is a 73 y.o. male presenting with shortness of breath. The history is provided by the patient.  Shortness of Breath Severity:  Moderate Onset quality:  Gradual Duration:  2 days Timing:  Constant Progression:  Worsening Chronicity:  New Context: activity and URI   Relieved by:  Rest and oxygen Worsened by:  Activity Ineffective treatments:  Diuretics Associated symptoms: cough, sputum production and wheezing   Associated symptoms: no abdominal pain, no chest pain, no fever and no vomiting   Associated symptoms comment:  Decreased appetite and blood sugars have been up today Risk factors: hx of cancer and tobacco use   Risk factors comment:  On chemo   Past Medical History  Diagnosis Date  . Renal disorder   . Pacemaker   . Multiple myeloma   . Skin cancer of face     "once; left side of my face" (03/17/2013)  . Shortness of breath     "when I take one of my chemo pills" (03/17/2013)  . Daily headache   . Arthritis     "little in my right shoulder" (03/17/2013)   Past Surgical History  Procedure Laterality Date  . Appendectomy    . Kidney surgery Right 2000's    "cut out ~ 1/2"   . Insert / replace / remove pacemaker     No family history on file. History  Substance Use Topics  . Smoking status: Current Every Day Smoker -- 1.00 packs/day for 60 years    Types: Cigarettes  . Smokeless tobacco: Never Used     Comment: 03/17/2013 "smoked a cigarette yesterday; sometimes none"  . Alcohol Use: No    Review of Systems  Constitutional: Negative for fever.  Respiratory: Positive for cough, sputum production, shortness of breath and wheezing.   Cardiovascular: Positive for leg swelling.  Negative for chest pain.       Leg swelling for the last month that is unchanged and maybe a little better today  Gastrointestinal: Negative for vomiting and abdominal pain.  All other systems reviewed and are negative.      Allergies  Review of patient's allergies indicates no known allergies.  Home Medications   Current Outpatient Rx  Name  Route  Sig  Dispense  Refill  . acyclovir (ZOVIRAX) 400 MG tablet   Oral   Take 400 mg by mouth daily.         Marland Kitchen amiodarone (PACERONE) 200 MG tablet   Oral   Take 200 mg by mouth daily.         Marland Kitchen dexamethasone (DECADRON) 4 MG tablet   Oral   Take 40 mg by mouth daily. Every Tuesday.         . ferrous sulfate 325 (65 FE) MG tablet   Oral   Take 325 mg by mouth daily with breakfast.         . furosemide (LASIX) 80 MG tablet   Oral   Take 160 mg by mouth daily.         . megestrol (MEGACE) 40 MG/ML suspension   Oral   Take 800 mg by mouth daily.         . tamsulosin (FLOMAX) 0.4 MG CAPS capsule  Oral   Take 0.4 mg by mouth daily after breakfast.          BP 115/71  Pulse 101  Temp(Src) 98.2 F (36.8 C) (Oral)  Resp 17  Ht 5' 5"  (1.651 m)  Wt 155 lb (70.308 kg)  BMI 25.79 kg/m2  SpO2 97% Physical Exam  Nursing note and vitals reviewed. Constitutional: He is oriented to person, place, and time. He appears well-developed and well-nourished. No distress.  HENT:  Head: Normocephalic and atraumatic.  Mouth/Throat: Oropharynx is clear and moist.  Eyes: Conjunctivae and EOM are normal. Pupils are equal, round, and reactive to light.  Neck: Normal range of motion. Neck supple.  Cardiovascular: Intact distal pulses.  An irregularly irregular rhythm present. Tachycardia present.   No murmur heard. Pulmonary/Chest: Effort normal. No respiratory distress. He has no wheezes. He has rhonchi in the right lower field and the left lower field. He has no rales.  Abdominal: Soft. He exhibits no distension. There is no  tenderness. There is no rebound and no guarding.  Musculoskeletal: Normal range of motion. He exhibits edema. He exhibits no tenderness.  3+ pitting edema up to the knee bilaterally  Neurological: He is alert and oriented to person, place, and time.  Skin: Skin is warm and dry. No rash noted. No erythema.  Psychiatric: He has a normal mood and affect. His behavior is normal.    ED Course  Procedures (including critical care time) Labs Review Labs Reviewed  CBC - Abnormal; Notable for the following:    WBC 14.2 (*)    RBC 3.42 (*)    Hemoglobin 10.4 (*)    HCT 30.6 (*)    RDW 15.7 (*)    Platelets 117 (*)    All other components within normal limits  BASIC METABOLIC PANEL - Abnormal; Notable for the following:    Glucose, Bld 101 (*)    BUN 81 (*)    Creatinine, Ser 3.49 (*)    GFR calc non Af Amer 16 (*)    GFR calc Af Amer 19 (*)    All other components within normal limits  PRO B NATRIURETIC PEPTIDE - Abnormal; Notable for the following:    Pro B Natriuretic peptide (BNP) 16467.0 (*)    All other components within normal limits  I-STAT TROPOININ, ED   Imaging Review Dg Chest 2 View  06/08/2013   CLINICAL DATA:  Shortness of breath and chest pain.  EXAM: CHEST  2 VIEW  COMPARISON:  03/17/2013.  FINDINGS: Patchy ill-defined opacities throughout the lung bases bilaterally. No confluent consolidative airspace disease. No pleural effusions. No evidence of pulmonary edema. Heart size is normal. Upper mediastinal contours are within normal limits. Atherosclerosis in the thoracic aorta. Left-sided pacemaker device in place with lead tips projecting over the expected location of the right atrium and right ventricular apex.  IMPRESSION: 1. Patchy bibasilar opacities concerning for sequela of recent aspiration or developing bilateral lower lobe bronchopneumonia. 2. Atherosclerosis.   Electronically Signed   By: Vinnie Langton M.D.   On: 06/08/2013 23:28     EKG  Interpretation   Date/Time:  Thursday June 08 2013 23:50:42 EDT Ventricular Rate:  109 PR Interval:  189 QRS Duration: 136 QT Interval:  387 QTC Calculation: 521 R Axis:   -49 Text Interpretation:  Atrial fibrillation Ventricular-paced complexes No  further rhythm analysis attempted due to paced rhythm Nonspecific IVCD  with LAD Anterior infarct, old Nonspecific T abnormalities, lateral leads  No significant change since  last tracing Confirmed by [facility]  MD,  Loree Fee ([id number]) on 06/08/2013 11:55:41 PM      MDM   Final diagnoses:  HCAP (healthcare-associated pneumonia)  Atrial fibrillation    Patient presenting with worsening shortness of breath over the last 2 days with productive cough. Patient has a history of melanoma currently on chemotherapy, chronic kidney disease and is most likely atrial fibrillation.  Patient denies fever but states his blood sugars have been high today with decreased appetite. Had a 90% on room air which improves with oxygen to 96%. He is in no acute distress on exam however chest x-ray shows bibasilar opacities concerning for bronchopneumonia. Patient does have evidence of fluid overload in his legs but no rales on chest exam.  Patient just increased his Lasix today to 160 mg.  Patient was admitted in January so is currently healthcare associated pneumonia. He is her main concern is that we'll admit for further care.    Blanchie Dessert, MD 06/08/13 2356  Blanchie Dessert, MD 06/09/13 8527

## 2013-06-08 NOTE — Progress Notes (Addendum)
ANTIBIOTIC CONSULT NOTE - INITIAL  Pharmacy Consult for zosyn Indication: HCAP  No Known Allergies  Patient Measurements: Height: 5' 5"  (165.1 cm) Weight: 155 lb (70.308 kg) IBW/kg (Calculated) : 61.5   Vital Signs: Temp: 98.2 F (36.8 C) (03/26 2138) Temp src: Oral (03/26 2138) BP: 115/71 mmHg (03/26 2300) Pulse Rate: 101 (03/26 2300) Intake/Output from previous day:   Intake/Output from this shift:    Labs:  Recent Labs  06/08/13 2145  WBC 14.2*  HGB 10.4*  PLT 117*  CREATININE 3.49*   Estimated Creatinine Clearance: 17.1 ml/min (by C-G formula based on Cr of 3.49). No results found for this basename: VANCOTROUGH, VANCOPEAK, VANCORANDOM, GENTTROUGH, GENTPEAK, GENTRANDOM, TOBRATROUGH, TOBRAPEAK, TOBRARND, AMIKACINPEAK, AMIKACINTROU, AMIKACIN,  in the last 72 hours   Microbiology: No results found for this or any previous visit (from the past 720 hour(s)).  Medical History: Past Medical History  Diagnosis Date  . Renal disorder   . Pacemaker   . Multiple myeloma   . Skin cancer of face     "once; left side of my face" (03/17/2013)  . Shortness of breath     "when I take one of my chemo pills" (03/17/2013)  . Daily headache   . Arthritis     "little in my right shoulder" (03/17/2013)    Medications:  See med history Assessment: 73 yo man to start broad spectrum antibiotics for PNA.  SrCr 3.49, est CrCl ~20 ml/min.  Vanc was ordered by MD then dced.  Zosyn 2.25 gm given in ED  Goal of Therapy:  Eradication of infection  Plan:  Zosyn 2.25 gm IV q6 hours Monitor renal function, cultures and clinical course.  Thanks for allowing pharmacy to be a part of this patient's care.  Excell Seltzer, PharmD Clinical Pharmacist, [phone] 06/08/2013,11:53 PM  Addendum: Spoke to Dr. Ree Kida, given suspected HCAP, will resume vancomycin as well. 1. Vancomycin 1250 mg IV loading dose x 1. 2. Then start vancomycin 1g IV q 48 hrs.  Uvaldo Rising, BCPS  Clinical  Pharmacist Pager [phone]  06/09/2013 10:08 AM

## 2013-06-09 ENCOUNTER — Encounter (HOSPITAL_COMMUNITY): Payer: Self-pay | Admitting: General Practice

## 2013-06-09 DIAGNOSIS — E875 Hyperkalemia: Secondary | ICD-10-CM

## 2013-06-09 DIAGNOSIS — Z950 Presence of cardiac pacemaker: Secondary | ICD-10-CM | POA: Diagnosis present

## 2013-06-09 DIAGNOSIS — I4891 Unspecified atrial fibrillation: Secondary | ICD-10-CM

## 2013-06-09 DIAGNOSIS — J189 Pneumonia, unspecified organism: Secondary | ICD-10-CM | POA: Insufficient documentation

## 2013-06-09 DIAGNOSIS — E43 Unspecified severe protein-calorie malnutrition: Secondary | ICD-10-CM

## 2013-06-09 DIAGNOSIS — N179 Acute kidney failure, unspecified: Secondary | ICD-10-CM

## 2013-06-09 DIAGNOSIS — N184 Chronic kidney disease, stage 4 (severe): Secondary | ICD-10-CM

## 2013-06-09 DIAGNOSIS — C9000 Multiple myeloma not having achieved remission: Secondary | ICD-10-CM

## 2013-06-09 DIAGNOSIS — R0602 Shortness of breath: Secondary | ICD-10-CM | POA: Diagnosis present

## 2013-06-09 LAB — CBC WITH DIFFERENTIAL/PLATELET
BASOS PCT: 0 % (ref 0–1)
Basophils Absolute: 0 10*3/uL (ref 0.0–0.1)
Eosinophils Absolute: 0 10*3/uL (ref 0.0–0.7)
Eosinophils Relative: 0 % (ref 0–5)
HCT: 26.4 % — ABNORMAL LOW (ref 39.0–52.0)
Hemoglobin: 8.9 g/dL — ABNORMAL LOW (ref 13.0–17.0)
LYMPHS ABS: 0.9 10*3/uL (ref 0.7–4.0)
Lymphocytes Relative: 7 % — ABNORMAL LOW (ref 12–46)
MCH: 30.2 pg (ref 26.0–34.0)
MCHC: 33.7 g/dL (ref 30.0–36.0)
MCV: 89.5 fL (ref 78.0–100.0)
MONOS PCT: 7 % (ref 3–12)
Monocytes Absolute: 1 10*3/uL (ref 0.1–1.0)
NEUTROS PCT: 86 % — AB (ref 43–77)
Neutro Abs: 11.4 10*3/uL — ABNORMAL HIGH (ref 1.7–7.7)
Platelets: 109 10*3/uL — ABNORMAL LOW (ref 150–400)
RBC: 2.95 MIL/uL — ABNORMAL LOW (ref 4.22–5.81)
RDW: 15.8 % — ABNORMAL HIGH (ref 11.5–15.5)
WBC: 13.3 10*3/uL — ABNORMAL HIGH (ref 4.0–10.5)

## 2013-06-09 LAB — BASIC METABOLIC PANEL
BUN: 83 mg/dL — AB (ref 6–23)
CO2: 23 meq/L (ref 19–32)
Calcium: 8.8 mg/dL (ref 8.4–10.5)
Chloride: 104 mEq/L (ref 96–112)
Creatinine, Ser: 3.53 mg/dL — ABNORMAL HIGH (ref 0.50–1.35)
GFR calc Af Amer: 19 mL/min — ABNORMAL LOW (ref >90)
GFR, EST NON AFRICAN AMERICAN: 16 mL/min — AB (ref >90)
Glucose, Bld: 70 mg/dL (ref 70–99)
Potassium: 4 mEq/L (ref 3.7–5.3)
Sodium: 143 mEq/L (ref 137–147)

## 2013-06-09 LAB — EXPECTORATED SPUTUM ASSESSMENT W REFEX TO RESP CULTURE

## 2013-06-09 LAB — EXPECTORATED SPUTUM ASSESSMENT W GRAM STAIN, RFLX TO RESP C

## 2013-06-09 LAB — STREP PNEUMONIAE URINARY ANTIGEN: Strep Pneumo Urinary Antigen: NEGATIVE

## 2013-06-09 MED ORDER — FUROSEMIDE 80 MG PO TABS
160.0000 mg | ORAL_TABLET | Freq: Every day | ORAL | Status: DC
  Administered 2013-06-09 – 2013-06-10 (×2): 160 mg via ORAL
  Filled 2013-06-09 (×2): qty 2

## 2013-06-09 MED ORDER — MEGESTROL ACETATE 40 MG/ML PO SUSP
800.0000 mg | Freq: Every day | ORAL | Status: DC
  Administered 2013-06-09 – 2013-06-10 (×2): 800 mg via ORAL
  Filled 2013-06-09 (×2): qty 20

## 2013-06-09 MED ORDER — DEXAMETHASONE 6 MG PO TABS: 40.0000 mg | ORAL_TABLET | ORAL | Status: DC

## 2013-06-09 MED ORDER — DILTIAZEM HCL 25 MG/5ML IV SOLN
10.0000 mg | Freq: Once | INTRAVENOUS | Status: AC
  Administered 2013-06-09: 10 mg via INTRAVENOUS
  Filled 2013-06-09: qty 5

## 2013-06-09 MED ORDER — TAMSULOSIN HCL 0.4 MG PO CAPS
0.4000 mg | ORAL_CAPSULE | Freq: Every day | ORAL | Status: DC
  Administered 2013-06-09 – 2013-06-10 (×2): 0.4 mg via ORAL
  Filled 2013-06-09 (×3): qty 1

## 2013-06-09 MED ORDER — SODIUM CHLORIDE 0.9 % IV SOLN: 250.0000 mL | INTRAVENOUS | Status: DC | PRN

## 2013-06-09 MED ORDER — AMIODARONE HCL 200 MG PO TABS
200.0000 mg | ORAL_TABLET | Freq: Every day | ORAL | Status: DC
  Administered 2013-06-09 – 2013-06-10 (×2): 200 mg via ORAL
  Filled 2013-06-09 (×2): qty 1

## 2013-06-09 MED ORDER — PIPERACILLIN-TAZOBACTAM IN DEX 2-0.25 GM/50ML IV SOLN
2.2500 g | Freq: Four times a day (QID) | INTRAVENOUS | Status: DC
  Administered 2013-06-09 – 2013-06-10 (×5): 2.25 g via INTRAVENOUS
  Filled 2013-06-09 (×7): qty 50

## 2013-06-09 MED ORDER — VANCOMYCIN HCL IN DEXTROSE 1-5 GM/200ML-% IV SOLN: 1000.0000 mg | INTRAVENOUS | Status: DC

## 2013-06-09 MED ORDER — BIOTENE DRY MOUTH MT LIQD
15.0000 mL | Freq: Two times a day (BID) | OROMUCOSAL | Status: DC
  Administered 2013-06-09 – 2013-06-10 (×2): 15 mL via OROMUCOSAL

## 2013-06-09 MED ORDER — SODIUM CHLORIDE 0.9 % IJ SOLN
3.0000 mL | Freq: Two times a day (BID) | INTRAMUSCULAR | Status: DC
  Administered 2013-06-09 (×3): 3 mL via INTRAVENOUS

## 2013-06-09 MED ORDER — VANCOMYCIN HCL 10 G IV SOLR
1250.0000 mg | Freq: Once | INTRAVENOUS | Status: AC
  Administered 2013-06-09: 1250 mg via INTRAVENOUS
  Filled 2013-06-09: qty 1250

## 2013-06-09 MED ORDER — ALBUTEROL SULFATE (2.5 MG/3ML) 0.083% IN NEBU: 2.5000 mg | INHALATION_SOLUTION | RESPIRATORY_TRACT | Status: DC | PRN

## 2013-06-09 MED ORDER — SODIUM CHLORIDE 0.9 % IV SOLN: Freq: Once | INTRAVENOUS | Status: DC

## 2013-06-09 MED ORDER — SODIUM CHLORIDE 0.9 % IJ SOLN: 3.0000 mL | INTRAMUSCULAR | Status: DC | PRN

## 2013-06-09 MED ORDER — ACYCLOVIR 400 MG PO TABS
400.0000 mg | ORAL_TABLET | Freq: Every day | ORAL | Status: DC
  Administered 2013-06-09 – 2013-06-10 (×2): 400 mg via ORAL
  Filled 2013-06-09 (×2): qty 1

## 2013-06-09 MED ORDER — FERROUS SULFATE 325 (65 FE) MG PO TABS
325.0000 mg | ORAL_TABLET | Freq: Every day | ORAL | Status: DC
  Administered 2013-06-09 – 2013-06-10 (×2): 325 mg via ORAL
  Filled 2013-06-09 (×3): qty 1

## 2013-06-09 NOTE — Care Management Note (Signed)
    Page 1 of 1   06/09/2013     1:53:14 PM   CARE MANAGEMENT NOTE 06/09/2013  Patient:  MORLEY, GAUMER   Account Number:  [id number]  Date Initiated:  06/09/2013  Documentation initiated by:  [facility]  Subjective/Objective Assessment:   73 yo male with hcap immunocompromised state//Home with spouse     Action/Plan:   Broad spectrum abx, vanc and zosyn.  On CAP pathway.//Access for Home Health needs.   Anticipated DC Date:  06/11/2013   Anticipated DC Plan:  [place]  CM consult      Choice offered to / List presented to:             Status of service:  In process, will continue to follow Medicare Important Message given?   (If response is "NO", the following Medicare IM given date fields will be blank) Date Medicare IM given:   Date Additional Medicare IM given:    Discharge Disposition:    Per UR Regulation:  Reviewed for med. necessity/level of care/duration of stay  If discussed at [place] of Stay Meetings, dates discussed:    Comments:

## 2013-06-09 NOTE — Evaluation (Signed)
Physical Therapy Evaluation Patient Details Name: DAYLON LAFAVOR MRN: [id number] DOB: April 25, 1940 Today's Date: 06/09/2013   History of Present Illness  73 yo male h/o multiple myeloma on chemo, ckd baseline cr 3.5, comes in with several days of productive cough, not feeling well, fever, and sob.   Clinical Impression  Pt adm due to the above. Presents with generalized weakness and decreased functional mobility. Pt to benefit from skilled acute PT to maximize independence and functional mobility prior to D/C home with wife. Pt refusing OPPT at this time. Encouraged pt to ambulate with AD upon D/C to reduce risk of falls, pt seemed agreeable but unsure of his compliance. Spoke with RN and encouraged pt to ambulate unit as tolerated with nursing, nursing verbalized understanding.    Follow Up Recommendations Other (comment);Supervision/Assistance - 24 hour (OPPT neuro for balance; pt refusing )    Equipment Recommendations  None recommended by PT    Recommendations for Other Services       Precautions / Restrictions Precautions Precautions: Fall Precaution Comments: reports he has fallen multiple times at home but denies any in last 2 months  Restrictions Weight Bearing Restrictions: No      Mobility  Bed Mobility Overal bed mobility: Modified Independent             General bed mobility comments: effortful for pt; no physical (A) needed; relies on handrail to push up to sit  Transfers Overall transfer level: Needs assistance Equipment used: 1 person hand held assist Transfers: Sit to/from Stand Sit to Stand: Min guard         General transfer comment: pt with slight sway; requires min guard to steady and maintain balance; cues for hand placement and sequencing  Ambulation/Gait Ambulation/Gait assistance: Min guard Ambulation Distance (Feet): 90 Feet Assistive device: None Gait Pattern/deviations: Step-through pattern;Decreased stride length;Narrow base of  support;Trunk flexed Gait velocity: decreased Gait velocity interpretation: Below normal speed for age/gender General Gait Details: min guard to supervision for ambulation; with directional changes, pt was unsteady and required min guard with static gt pt more steady; encouraged to ambulate with RW if pt finds himself reaching for UE support; cues for safety and sequencing   Stairs            Wheelchair Mobility    Modified Rankin (Stroke Patients Only)       Balance Overall balance assessment: Needs assistance;History of Falls Sitting-balance support: Feet supported;No upper extremity supported Sitting balance-Leahy Scale: Good     Standing balance support: During functional activity;No upper extremity supported Standing balance-Leahy Scale: Fair Standing balance comment: cannot tolerate weightshifting                      Pertinent Vitals/Pain No c/o pain. On 2 L O2    Home Living Family/patient expects to be discharged to:: Private residence Living Arrangements: Spouse/significant other Available Help at Discharge: Family;Available 24 hours/day Type of Home: House Home Access: Stairs to enter Entrance Stairs-Rails: Right;Left;Can reach both Entrance Stairs-Number of Steps: 4 Home Layout: One level Home Equipment: Walker - 2 wheels;Wheelchair - manual Additional Comments: pt has [place]    Prior Function Level of Independence: Needs assistance   Gait / Transfers Assistance Needed: ambulate without AD in house per pt; uses wheelchair for long distances  in community; has RW if needed he states  ADL's / Homemaking Assistance Needed: wife gives pt bird bath; (A) with dressing as needed   Comments: reports he does not  use AD in house but holds onto "things"      Hand Dominance        Extremity/Trunk Assessment   Upper Extremity Assessment: Defer to OT evaluation           Lower Extremity Assessment: Generalized weakness      Cervical /  Trunk Assessment: Kyphotic  Communication   Communication: No difficulties  Cognition Arousal/Alertness: Awake/alert Behavior During Therapy: WFL for tasks assessed/performed Overall Cognitive Status: Within Functional Limits for tasks assessed                      General Comments General comments (skin integrity, edema, etc.): pt sitting on EOB; nurse aware            Assessment/Plan    PT Assessment Patient needs continued PT services  PT Diagnosis Abnormality of gait;Generalized weakness   PT Problem List Decreased strength;Decreased activity tolerance;Decreased mobility;Decreased balance;Decreased safety awareness  PT Treatment Interventions DME instruction;Gait training;Stair training;Functional mobility training;Therapeutic exercise;Therapeutic activities;Balance training;Neuromuscular re-education;Patient/family education   PT Goals (Current goals can be found in the Care Plan section) Acute Rehab PT Goals Patient Stated Goal: to go home PT Goal Formulation: With patient Time For Goal Achievement: 06/16/13 Potential to Achieve Goals: Good    Frequency Min 3X/week   Barriers to discharge        End of Session Equipment Utilized During Treatment: Gait belt;Oxygen Activity Tolerance: Patient tolerated treatment well Patient left: in bed;with call bell/phone within reach (EOB sitting )         Time: 0051-1021 PT Time Calculation (min): 20 min   Charges:   PT Evaluation $Initial PT Evaluation Tier I: 1 Procedure PT Treatments $Gait Training: 8-22 mins   PT G CodesGustavus Bryant, [place] 06/09/2013, 4:42 PM

## 2013-06-09 NOTE — H&P (Signed)
PCP:   Bufford Buttner, MD   Chief Complaint:  Cough, sob  HPI: 73 yo male h/o multiple myeloma on chemo, ckd baseline cr 3.5, comes in with several days of productive cough, not feeling well, fever, and sob.  He has chronic peripheral edema and his lasix was increased in the last several days and his swelling in his legs has gotten much better per pt and wife.  He has been urinating a lot more than normal.  Lasix was increased to 117m bid.  No dysuria.  No abd pain.  Is sob with cough.  No cp.  Was wheezing some at home.  His HCrystalsaw him today and recommended for him to come to ED to get checked out due to his sob and fever.    Review of Systems:  Positive and negative as per HPI otherwise all other systems are negative  Past Medical History: Past Medical History  Diagnosis Date  . Renal disorder   . Pacemaker   . Multiple myeloma   . Skin cancer of face     "once; left side of my face" (03/17/2013)  . Shortness of breath     "when I take one of my chemo pills" (03/17/2013)  . Daily headache   . Arthritis     "little in my right shoulder" (03/17/2013)   Past Surgical History  Procedure Laterality Date  . Appendectomy    . Kidney surgery Right 2000's    "cut out ~ 1/2"   . Insert / replace / remove pacemaker      Medications: Prior to Admission medications   Medication Sig Start Date End Date Taking? Authorizing Provider  acyclovir (ZOVIRAX) 400 MG tablet Take 400 mg by mouth daily.   Yes Historical Provider, MD  amiodarone (PACERONE) 200 MG tablet Take 200 mg by mouth daily.   Yes Historical Provider, MD  dexamethasone (DECADRON) 4 MG tablet Take 40 mg by mouth daily. Every Tuesday.   Yes Historical Provider, MD  ferrous sulfate 325 (65 FE) MG tablet Take 325 mg by mouth daily with breakfast.   Yes Historical Provider, MD  furosemide (LASIX) 80 MG tablet Take 160 mg by mouth daily.   Yes Historical Provider, MD  megestrol (MEGACE) 40 MG/ML suspension Take 800 mg by mouth  daily.   Yes Historical Provider, MD  tamsulosin (FLOMAX) 0.4 MG CAPS capsule Take 0.4 mg by mouth daily after breakfast.   Yes Historical Provider, MD    Allergies:  No Known Allergies  Social History:  reports that he has been smoking Cigarettes.  He has a 60 pack-year smoking history. He has never used smokeless tobacco. He reports that he does not drink alcohol or use illicit drugs.  Family History: none  Physical Exam: Filed Vitals:   06/08/13 2138 06/08/13 2300 06/08/13 2305 06/08/13 2356  BP: 133/102 115/71  113/69  Pulse: 70 101  111  Temp: 98.2 F (36.8 C)     TempSrc: Oral     Resp: 22 17  22   Height: 5' 5"  (1.651 m)     Weight: 70.308 kg (155 lb)     SpO2: 92% 94% 97% 97%   General appearance: alert, cooperative and no distress Head: Normocephalic, without obvious abnormality, atraumatic Eyes: negative Nose: Nares normal. Septum midline. Mucosa normal. No drainage or sinus tenderness. Neck: no JVD and supple, symmetrical, trachea midline Lungs: clear to auscultation bilaterally Heart: regular rate and rhythm, S1, S2 normal, no murmur, click, rub or gallop Abdomen:  soft, non-tender; bowel sounds normal; no masses,  no organomegaly Extremities: edema 3+ Pulses: 2+ and symmetric Skin: Skin color, texture, turgor normal. No rashes or lesions Neurologic: Grossly normal    Labs on Admission:   Recent Labs  06/08/13 2145  NA 142  K 3.8  CL 103  CO2 22  GLUCOSE 101*  BUN 81*  CREATININE 3.49*  CALCIUM 8.8    Recent Labs  06/08/13 2145  WBC 14.2*  HGB 10.4*  HCT 30.6*  MCV 89.5  PLT 117*   Radiological Exams on Admission: Dg Chest 2 View  06/08/2013   CLINICAL DATA:  Shortness of breath and chest pain.  EXAM: CHEST  2 VIEW  COMPARISON:  03/17/2013.  FINDINGS: Patchy ill-defined opacities throughout the lung bases bilaterally. No confluent consolidative airspace disease. No pleural effusions. No evidence of pulmonary edema. Heart size is normal.  Upper mediastinal contours are within normal limits. Atherosclerosis in the thoracic aorta. Left-sided pacemaker device in place with lead tips projecting over the expected location of the right atrium and right ventricular apex.  IMPRESSION: 1. Patchy bibasilar opacities concerning for sequela of recent aspiration or developing bilateral lower lobe bronchopneumonia. 2. Atherosclerosis.   Electronically Signed   By: Vinnie Langton M.D.   On: 06/08/2013 23:28    Assessment/Plan  73 yo male with hcap immunocompromised state  Principal Problem:   HCAP (healthcare-associated pneumonia)- place on broad spectrum abx, vanc and zosyn.  On pna pathway.  No wheezing now, much improved with neb in ED.  Cont nebs.    Active Problems:   Multiple myeloma   Chronic kidney disease (CKD), stage IV (severe)  At baseline, seems edema has improved with recent increase in lasix, follow cr closely.     Protein-calorie malnutrition, severe   Pacemaker   Shortness of breath    DAVID,RACHAL A 06/09/2013, 12:37 AM

## 2013-06-09 NOTE — Progress Notes (Signed)
Patient alert and oriented x4, wife at bedside.  Patient sitting up on edge of bed, tilted to right side, lying over a pillow.  Patient refused repositioning stating, "this is the only position I'm comfortable in".  Patient denies pain, shortness of breath, or nausea.  Patient refusing renal diet, MD notified, regular diet ordered.  Patient and wife deny any questions or concerns at this time.  Will continue to monitor.

## 2013-06-09 NOTE — Progress Notes (Signed)
   [facility]                                                                              Patient Demographics  Scott [name], is a 73 y.o. male, DOB - 1941-03-14, YME:[id number]  Admit date - 06/08/2013   Admitting Physician Phillips Grout, MD  Outpatient Primary MD for the patient is Bufford Buttner, MD  LOS - 1   Chief Complaint  Patient presents with  . Shortness of Breath      HPI 73 yo male h/o multiple myeloma on chemo, ckd baseline cr 3.5, comes in with several days of productive cough, not feeling well, fever, and sob. He has chronic peripheral edema and his lasix was increased in the last several days and his swelling in his legs has gotten much better per pt and wife. He has been urinating a lot more than normal. Lasix was increased to $RemoveBefo'160mg'LvWZfypnsMx$  bid. No dysuria. No abd pain. Is sob with cough. No cp. Was wheezing some at home. His [place] saw him today and recommended for him to come to ED to get checked out due to his sob and fever.   Assessment & Plan   Health care associated pneumonia -Continue on vancomycin and Zosyn, nebulizer treatments -Chest x-ray showed patchy bibasilar opacities -Patient does have some leukocytosis.  Multiple myeloma -Continue Decadron  Chronic kidney disease stage IV -Creatinine is approximately 3.5, will continue to monitor.  Severe Protein-Calorie malnutrition -Continue Megace  Lower extremity swelling -Continue Lasix  Code Status: Full  Family Communication: Family at bedside  Disposition Plan: Admitted

## 2013-06-10 DIAGNOSIS — R0602 Shortness of breath: Secondary | ICD-10-CM

## 2013-06-10 LAB — LEGIONELLA ANTIGEN, URINE: Legionella Antigen, Urine: NEGATIVE

## 2013-06-10 MED ORDER — LEVOFLOXACIN 500 MG PO TABS: 500.0000 mg | ORAL_TABLET | ORAL | Status: DC

## 2013-06-10 NOTE — Discharge Instructions (Signed)

## 2013-06-10 NOTE — Discharge Summary (Signed)
Physician Discharge Summary  Scott [name] WIO:[id number] DOB: 05/31/1940 DOA: 06/08/2013  PCP: Bufford Buttner, MD  Admit date: 06/08/2013 Discharge date: 06/10/2013  Time spent: 35 minutes  Recommendations for Outpatient Follow-up:  Patient will be discharged to home. He is to continue his medications as prescribed. He is follow up with his primary care physician within one week of discharge as well as his hematologist/oncologist. Patient should follow a heart healthy/renal diet.   Discharge Diagnoses:  Principal Problem:   HCAP (healthcare-associated pneumonia) Active Problems:   Multiple myeloma   Chronic kidney disease (CKD), stage IV (severe)   Protein-calorie malnutrition, severe   Pacemaker   Shortness of breath   Lower extremity swelling   Deconditioning  Discharge Condition: Stable  Diet recommendation: Regular (patient refused heart healthy/renal diet)  Filed Weights   06/08/13 2138 06/09/13 0200 06/10/13 0543  Weight: 70.308 kg (155 lb) 65.6 kg (144 lb 10 oz) 65.1 kg (143 lb 8.3 oz)    History of present illness:  73 yo male h/o multiple myeloma on chemo, ckd baseline cr 3.5, comes in with several days of productive cough, not feeling well, fever, and sob. He has chronic peripheral edema and his lasix was increased in the last several days and his swelling in his legs has gotten much better per pt and wife. He has been urinating a lot more than normal. Lasix was increased to 139m bid. No dysuria. No abd pain. Is sob with cough. No cp. Was wheezing some at home. His [place] him today and recommended for him to come to ED to get checked out due to his sob and fever.   Hospital Course:  Health care associated pneumonia  -Was placed on vancomycin and Zosyn while hospitalized, nebulizer treatments  -Will be discharged on levaquin -Chest x-ray showed patchy bibasilar opacities  -Patient does have some leukocytosis, which is improved   Multiple myeloma    -Continue Decadron  -Follow up with oncologist  Chronic kidney disease stage IV  -Creatinine is approximately 3.5, will continue to monitor.   Severe Protein-Calorie malnutrition  -Continue Megace   Lower extremity swelling  -Continue Lasix  Deconditioning -Patient refused physical therapy  Procedures:  None  Consultations:  None  Discharge Exam: Filed Vitals:   06/10/13 0546  BP: 121/58  Pulse: 80  Temp:   Resp: 22     General: Well developed, malnourished, NAD, appears stated age  HEENT: NCAT, PERRLA, EOMI, Anicteic Sclera, mucous membranes moist.   Neck: Supple, no JVD, no masses  Cardiovascular: S1 S2 auscultated, no rubs, murmurs or gallops. Regular rate and rhythm.  Respiratory: Few scattered wheezes, improved  Abdomen: Soft, nontender, nondistended, + bowel sounds  Extremities: warm dry without cyanosis clubbing, LE edema   Neuro: AAOx3, no focal deficits  Skin: Without rashes exudates or nodules  Psych: Normal affect and demeanor with intact judgement and insight  Discharge Instructions     Medication List    ASK your doctor about these medications       acyclovir 400 MG tablet  Commonly known as:  ZOVIRAX  Take 400 mg by mouth daily.     amiodarone 200 MG tablet  Commonly known as:  PACERONE  Take 200 mg by mouth daily.     dexamethasone 4 MG tablet  Commonly known as:  DECADRON  Take 40 mg by mouth every 7 (seven) days. Every Tuesday.     ferrous sulfate 325 (65 FE) MG tablet  Take 325 mg by mouth  daily with breakfast.     furosemide 80 MG tablet  Commonly known as:  LASIX  Take 160 mg by mouth daily.     megestrol 40 MG/ML suspension  Commonly known as:  MEGACE  Take 800 mg by mouth daily.     tamsulosin 0.4 MG Caps capsule  Commonly known as:  FLOMAX  Take 0.4 mg by mouth daily after breakfast.       No Known Allergies    The results of significant diagnostics from this hospitalization (including imaging,  microbiology, ancillary and laboratory) are listed below for reference.    Significant Diagnostic Studies: Dg Chest 2 View  06/08/2013   CLINICAL DATA:  Shortness of breath and chest pain.  EXAM: CHEST  2 VIEW  COMPARISON:  03/17/2013.  FINDINGS: Patchy ill-defined opacities throughout the lung bases bilaterally. No confluent consolidative airspace disease. No pleural effusions. No evidence of pulmonary edema. Heart size is normal. Upper mediastinal contours are within normal limits. Atherosclerosis in the thoracic aorta. Left-sided pacemaker device in place with lead tips projecting over the expected location of the right atrium and right ventricular apex.  IMPRESSION: 1. Patchy bibasilar opacities concerning for sequela of recent aspiration or developing bilateral lower lobe bronchopneumonia. 2. Atherosclerosis.   Electronically Signed   By: Vinnie Langton M.D.   On: 06/08/2013 23:28    Microbiology: Recent Results (from the past 240 hour(s))  CULTURE, EXPECTORATED SPUTUM-ASSESSMENT     Status: None   Collection Time    06/09/13 10:54 AM      Result Value Ref Range Status   Specimen Description SPUTUM   Final   Special Requests NONE   Final   Sputum evaluation     Final   Value: THIS SPECIMEN IS ACCEPTABLE. RESPIRATORY CULTURE REPORT TO FOLLOW.   Report Status 06/09/2013 FINAL   Final  CULTURE, RESPIRATORY (NON-EXPECTORATED)     Status: None   Collection Time    06/09/13 10:54 AM      Result Value Ref Range Status   Specimen Description SPUTUM   Final   Special Requests NONE   Final   Gram Stain     Final   Value: ABUNDANT WBC PRESENT, PREDOMINANTLY PMN     RARE SQUAMOUS EPITHELIAL CELLS PRESENT     MODERATE GRAM POSITIVE COCCI IN PAIRS     FEW GRAM NEGATIVE RODS     Performed at [facility]   Culture PENDING   Incomplete   Report Status PENDING   Incomplete     Labs: Basic Metabolic Panel:  Recent Labs Lab 06/08/13 2145 06/09/13 0445  NA 142 143  K 3.8 4.0  CL  103 104  CO2 22 23  GLUCOSE 101* 70  BUN 81* 83*  CREATININE 3.49* 3.53*  CALCIUM 8.8 8.8   Liver Function Tests: No results found for this basename: AST, ALT, ALKPHOS, BILITOT, PROT, ALBUMIN,  in the last 168 hours No results found for this basename: LIPASE, AMYLASE,  in the last 168 hours No results found for this basename: AMMONIA,  in the last 168 hours CBC:  Recent Labs Lab 06/08/13 2145 06/09/13 0910  WBC 14.2* 13.3*  NEUTROABS  --  11.4*  HGB 10.4* 8.9*  HCT 30.6* 26.4*  MCV 89.5 89.5  PLT 117* 109*   Cardiac Enzymes: No results found for this basename: CKTOTAL, CKMB, CKMBINDEX, TROPONINI,  in the last 168 hours BNP: BNP (last 3 results)  Recent Labs  06/08/13 2145  PROBNP 16467.0*  CBG: No results found for this basename: GLUCAP,  in the last 168 hours     Signed:  Cristal Ford  [facility] 06/10/2013, 8:02 AM

## 2013-06-10 NOTE — Progress Notes (Signed)
Discharge instructions given by Tai, charge nurse.  Review of AVS understood.  Wife at bedside.Marland Kitchen

## 2013-06-11 LAB — CULTURE, RESPIRATORY W GRAM STAIN

## 2013-06-11 LAB — CULTURE, RESPIRATORY: CULTURE: NORMAL

## 2013-06-15 LAB — CULTURE, BLOOD (ROUTINE X 2)
CULTURE: NO GROWTH
Culture: NO GROWTH

## 2013-06-20 ENCOUNTER — Other Ambulatory Visit: Payer: Self-pay | Admitting: *Deleted

## 2013-06-20 DIAGNOSIS — Z01810 Encounter for preprocedural cardiovascular examination: Secondary | ICD-10-CM

## 2013-06-20 DIAGNOSIS — N184 Chronic kidney disease, stage 4 (severe): Secondary | ICD-10-CM

## 2013-07-05 ENCOUNTER — Encounter: Payer: Self-pay | Admitting: Vascular Surgery

## 2013-07-06 ENCOUNTER — Ambulatory Visit (INDEPENDENT_AMBULATORY_CARE_PROVIDER_SITE_OTHER): Payer: Commercial Managed Care - HMO | Admitting: Vascular Surgery

## 2013-07-06 ENCOUNTER — Other Ambulatory Visit: Payer: Self-pay | Admitting: *Deleted

## 2013-07-06 ENCOUNTER — Encounter: Payer: Self-pay | Admitting: Vascular Surgery

## 2013-07-06 ENCOUNTER — Encounter: Payer: Self-pay | Admitting: *Deleted

## 2013-07-06 ENCOUNTER — Ambulatory Visit (HOSPITAL_COMMUNITY)
Admission: RE | Admit: 2013-07-06 | Discharge: 2013-07-06 | Disposition: A | Discharge status: Home / Self Care | Payer: Medicare HMO | Source: Ambulatory Visit | Attending: Vascular Surgery | Admitting: Vascular Surgery

## 2013-07-06 ENCOUNTER — Ambulatory Visit (INDEPENDENT_AMBULATORY_CARE_PROVIDER_SITE_OTHER)
Admission: RE | Admit: 2013-07-06 | Discharge: 2013-07-06 | Disposition: A | Discharge status: Home / Self Care | Payer: Medicare HMO | Source: Ambulatory Visit | Attending: Vascular Surgery | Admitting: Vascular Surgery

## 2013-07-06 VITALS — BP 113/69 | HR 89 | Resp 16 | Ht 65.0 in | Wt 178.3 lb

## 2013-07-06 DIAGNOSIS — Z01810 Encounter for preprocedural cardiovascular examination: Secondary | ICD-10-CM | POA: Insufficient documentation

## 2013-07-06 DIAGNOSIS — N184 Chronic kidney disease, stage 4 (severe): Secondary | ICD-10-CM

## 2013-07-06 DIAGNOSIS — C9000 Multiple myeloma not having achieved remission: Secondary | ICD-10-CM

## 2013-07-06 NOTE — Progress Notes (Signed)
  Referred by:  Robert Williford, MD [address] [place], [place] [address]  Reason for referral: New access  History of Present Illness  Scott [name] is a 72 y.o. (10/17/1940) male who presents for evaluation for permanent access.  The patient is right hand dominant.  The patient has not had previous access procedures.  Previous central venous cannulation procedures include: PPM.  The patient has had a L PPM placed.   Past Medical History  Diagnosis Date  . Renal disorder   . Pacemaker   . Shortness of breath     "when I take one of my chemo pills" (03/17/2013)  . Daily headache   . Arthritis     "little in my right shoulder" (03/17/2013)  . Protein calorie malnutrition   . Pneumonia 06/09/2013  . Multiple myeloma   . Skin cancer of face     "once; left side of my face" (03/17/2013)    Past Surgical History  Procedure Laterality Date  . Appendectomy    . Kidney surgery Right 2000's    "cut out ~ 1/2"   . Insert / replace / remove pacemaker      History   Social History  . Marital Status: Married    Spouse Name: N/A    Number of Children: N/A  . Years of Education: N/A   Occupational History  . Not on file.   Social History Main Topics  . Smoking status: Current Every Day Smoker -- 1.00 packs/day for 60 years    Types: Cigarettes  . Smokeless tobacco: Never Used     Comment: 03/17/2013 "smoked a cigarette yesterday; sometimes none"  . Alcohol Use: No  . Drug Use: No  . Sexual Activity: No   Other Topics Concern  . Not on file   Social History Narrative  . No narrative on file   Family History: patient cannot remember significant family medical problems  Current Outpatient Prescriptions on File Prior to Visit  Medication Sig Dispense Refill  . acyclovir (ZOVIRAX) 400 MG tablet Take 400 mg by mouth daily.      . amiodarone (PACERONE) 200 MG tablet Take 200 mg by mouth daily.      . dexamethasone (DECADRON) 4 MG tablet Take 40 mg by mouth every 7 (seven)  days. Every Tuesday.      . ferrous sulfate 325 (65 FE) MG tablet Take 325 mg by mouth daily with breakfast.      . furosemide (LASIX) 80 MG tablet Take 160 mg by mouth daily.      . megestrol (MEGACE) 40 MG/ML suspension Take 800 mg by mouth daily.      . tamsulosin (FLOMAX) 0.4 MG CAPS capsule Take 0.4 mg by mouth daily after breakfast.      . levofloxacin (LEVAQUIN) 500 MG tablet Take 1 tablet (500 mg total) by mouth every other day.  5 tablet  0   No current facility-administered medications on file prior to visit.    No Known Allergies  REVIEW OF SYSTEMS:  (Positives checked otherwise negative)  CARDIOVASCULAR:  [] chest pain, [] chest pressure, [] palpitations, [x] shortness of breath when laying flat, [x] shortness of breath with exertion,  [] pain in feet when walking, [] pain in feet when laying flat, [] history of blood clot in veins (DVT), [] history of phlebitis, [] swelling in legs, [] varicose veins  PULMONARY:  [] productive cough, [] asthma, [] wheezing  NEUROLOGIC:  [x] weakness in arms   or legs, [] numbness in arms or legs, [] difficulty speaking or slurred speech, [] temporary loss of vision in one eye, [] dizziness  HEMATOLOGIC:  [] bleeding problems, [] problems with blood clotting too easily  MUSCULOSKEL:  [] joint pain, [] joint swelling  GASTROINTEST:  [] vomiting blood, [] blood in stool     GENITOURINARY:  [] burning with urination, [] blood in urine  PSYCHIATRIC:  [] history of major depression  INTEGUMENTARY:  [] rashes, [] ulcers  CONSTITUTIONAL:  [] fever, [] chills  Physical Examination  Filed Vitals:   07/06/13 1525  BP: 113/69  Pulse: 89  Resp: 16  Height: 5' 5" (1.651 m)  Weight: 178 lb 4.8 oz (80.876 kg)   Body mass index is 29.67 kg/(m^2).  General: A&O x 3, WDWN  Head: [place]/AT  Ear/Nose/Throat: Hearing grossly intact, nares w/o erythema or drainage, oropharynx w/o Erythema/Exudate, Mallampati score: 3  Eyes: PERRLA, EOMI  Neck:  Supple, no nuchal rigidity, no palpable LAD  Pulmonary: Sym exp, good air movt, CTAB, no rales, rhonchi, & wheezing  Cardiac: RRR, Nl S1, S2, no Murmurs, rubs or gallops  Vascular: Vessel Right Left  Radial Palpable Palpable  Ulnar Palpable Palpable  Brachial Palpable Palpable  Carotid Palpable, without bruit Palpable, without bruit  Aorta Not palpable N/A  Femoral Palpable Palpable  Popliteal Not palpable Not palpable  PT Palpable Palpable  DP Palpable Palpable   Gastrointestinal: soft, NTND, -G/R, - HSM, - masses, - CVAT B  Musculoskeletal: M/S 5/5 throughout , Extremities without ischemic changes   Neurologic: CN 2-12 intact , Pain and light touch intact in extremities , Motor exam as listed above  Psychiatric: Judgment intact, Mood & affect appropriate for pt's clinical situation  Dermatologic: See M/S exam for extremity exam, no rashes otherwise noted  Lymph : No Cervical, Axillary, or Inguinal lymphadenopathy   Non-Invasive Vascular Imaging  Vein Mapping  (Date: 07/06/2013):   R arm: acceptable vein conduits include upper arm basilic   L arm: acceptable vein conduits include upper arm basilic  BUE Doppler (Date: 07/06/2013):   R arm:   Brachial: tri  Radial: tri  Ulnar: tri  L arm:   Brachial: tri  Radial: tri  Ulnar: tri  Outside Studies/Documentation 1 pages of outside documents were reviewed including: outpatient nephrology chart.  Medical Decision Making  Scott [name] is a 72 y.o. male who presents with chronic kidney disease stage IV  Based on vein mapping and examination, this patient's permanent access options include: R staged BVT.  His left arm is no longer an option for access due to PPM.  I had an extensive discussion with this patient in regards to the nature of access surgery, including risk, benefits, and alternatives.    The patient is aware that the risks of access surgery include but are not limited to: bleeding, infection,  steal syndrome, nerve damage, ischemic monomelic neuropathy, failure of access to mature, and possible need for additional access procedures in the future.  I discussed with the patient the nature of the staged access procedure, specifically the need for a second operation to transpose the first stage fistula if it matures adequately.    The patient has agreed to proceed with the above procedure which will be scheduled: 27 APR 15.  Brian Chen, MD [facility] Office: [phone] Pager: [phone]  07/06/2013, 5:26 PM     

## 2013-07-07 ENCOUNTER — Encounter (HOSPITAL_COMMUNITY): Payer: Self-pay | Admitting: *Deleted

## 2013-07-07 NOTE — Progress Notes (Signed)
Mr Eanes's cardiologist is Dr Raliegh Ip in [place].  I faxed a request for peri op pacemaker orders and last office visit and EKG. I also calle dand spoke with Olivia Mackie at the office and explained that surgery is at [facility] Monday.  Olivia Mackie said that they would try to get information to [place], Dr Raliegh Ip is out of the office and the office closes at 1230.

## 2013-07-09 MED ORDER — CEFUROXIME SODIUM 1.5 G IJ SOLR
1.5000 g | INTRAMUSCULAR | Status: AC
  Administered 2013-07-10: 1.5 g via INTRAVENOUS
  Filled 2013-07-09: qty 1.5

## 2013-07-10 ENCOUNTER — Encounter (HOSPITAL_COMMUNITY)
Admission: RE | Disposition: A | Discharge status: Home / Self Care | Payer: Self-pay | Source: Ambulatory Visit | Attending: Vascular Surgery

## 2013-07-10 ENCOUNTER — Ambulatory Visit (HOSPITAL_COMMUNITY): Payer: Medicare HMO

## 2013-07-10 ENCOUNTER — Ambulatory Visit (HOSPITAL_COMMUNITY): Payer: Medicare HMO | Admitting: Anesthesiology

## 2013-07-10 ENCOUNTER — Encounter (HOSPITAL_COMMUNITY): Payer: Self-pay | Admitting: *Deleted

## 2013-07-10 ENCOUNTER — Other Ambulatory Visit: Payer: Self-pay | Admitting: *Deleted

## 2013-07-10 ENCOUNTER — Ambulatory Visit (HOSPITAL_COMMUNITY)
Admission: RE | Admit: 2013-07-10 | Discharge: 2013-07-10 | Disposition: A | Discharge status: Home / Self Care | Payer: Medicare HMO | Source: Ambulatory Visit | Attending: Vascular Surgery | Admitting: Vascular Surgery

## 2013-07-10 ENCOUNTER — Telehealth: Payer: Self-pay | Admitting: Vascular Surgery

## 2013-07-10 ENCOUNTER — Encounter (HOSPITAL_COMMUNITY): Payer: Medicare HMO | Admitting: Anesthesiology

## 2013-07-10 DIAGNOSIS — Z950 Presence of cardiac pacemaker: Secondary | ICD-10-CM | POA: Insufficient documentation

## 2013-07-10 DIAGNOSIS — D649 Anemia, unspecified: Secondary | ICD-10-CM | POA: Insufficient documentation

## 2013-07-10 DIAGNOSIS — Z4931 Encounter for adequacy testing for hemodialysis: Secondary | ICD-10-CM

## 2013-07-10 DIAGNOSIS — R51 Headache: Secondary | ICD-10-CM | POA: Insufficient documentation

## 2013-07-10 DIAGNOSIS — F17200 Nicotine dependence, unspecified, uncomplicated: Secondary | ICD-10-CM | POA: Insufficient documentation

## 2013-07-10 DIAGNOSIS — M19019 Primary osteoarthritis, unspecified shoulder: Secondary | ICD-10-CM | POA: Insufficient documentation

## 2013-07-10 DIAGNOSIS — Z79899 Other long term (current) drug therapy: Secondary | ICD-10-CM | POA: Insufficient documentation

## 2013-07-10 DIAGNOSIS — N186 End stage renal disease: Secondary | ICD-10-CM

## 2013-07-10 DIAGNOSIS — E46 Unspecified protein-calorie malnutrition: Secondary | ICD-10-CM | POA: Insufficient documentation

## 2013-07-10 DIAGNOSIS — N184 Chronic kidney disease, stage 4 (severe): Secondary | ICD-10-CM | POA: Insufficient documentation

## 2013-07-10 DIAGNOSIS — Z85828 Personal history of other malignant neoplasm of skin: Secondary | ICD-10-CM | POA: Insufficient documentation

## 2013-07-10 DIAGNOSIS — C9000 Multiple myeloma not having achieved remission: Secondary | ICD-10-CM | POA: Insufficient documentation

## 2013-07-10 DIAGNOSIS — E119 Type 2 diabetes mellitus without complications: Secondary | ICD-10-CM | POA: Insufficient documentation

## 2013-07-10 HISTORY — PX: BASCILIC VEIN TRANSPOSITION: SHX5742

## 2013-07-10 HISTORY — DX: Personal history of other medical treatment: Z92.89

## 2013-07-10 HISTORY — DX: Unspecified cataract: H26.9

## 2013-07-10 LAB — POCT I-STAT 4, (NA,K, GLUC, HGB,HCT)
Glucose, Bld: 77 mg/dL (ref 70–99)
HCT: 35 % — ABNORMAL LOW (ref 39.0–52.0)
HEMOGLOBIN: 11.9 g/dL — AB (ref 13.0–17.0)
POTASSIUM: 2.9 meq/L — AB (ref 3.7–5.3)
SODIUM: 143 meq/L (ref 137–147)

## 2013-07-10 LAB — GLUCOSE, CAPILLARY: Glucose-Capillary: 88 mg/dL (ref 70–99)

## 2013-07-10 SURGERY — TRANSPOSITION, VEIN, BASILIC
Anesthesia: Monitor Anesthesia Care | Site: Arm Upper | Laterality: Right

## 2013-07-10 MED ORDER — OXYCODONE-ACETAMINOPHEN 5-325 MG PO TABS: 1.0000 | ORAL_TABLET | Freq: Four times a day (QID) | ORAL | Status: DC | PRN

## 2013-07-10 MED ORDER — CHLORHEXIDINE GLUCONATE CLOTH 2 % EX PADS: 6.0000 | MEDICATED_PAD | Freq: Once | CUTANEOUS | Status: DC

## 2013-07-10 MED ORDER — 0.9 % SODIUM CHLORIDE (POUR BTL) OPTIME
TOPICAL | Status: DC | PRN
  Administered 2013-07-10: 1000 mL

## 2013-07-10 MED ORDER — SODIUM CHLORIDE 0.9 % IR SOLN
Status: DC | PRN
  Administered 2013-07-10: 07:00:00

## 2013-07-10 MED ORDER — PHENYLEPHRINE 40 MCG/ML (10ML) SYRINGE FOR IV PUSH (FOR BLOOD PRESSURE SUPPORT)
PREFILLED_SYRINGE | INTRAVENOUS | Status: AC
  Filled 2013-07-10: qty 10

## 2013-07-10 MED ORDER — PROPOFOL 10 MG/ML IV BOLUS
INTRAVENOUS | Status: AC
  Filled 2013-07-10: qty 20

## 2013-07-10 MED ORDER — SODIUM CHLORIDE 0.9 % IJ SOLN
INTRAMUSCULAR | Status: AC
  Filled 2013-07-10: qty 10

## 2013-07-10 MED ORDER — THROMBIN 20000 UNITS EX SOLR
CUTANEOUS | Status: AC
  Filled 2013-07-10: qty 20000

## 2013-07-10 MED ORDER — BUPIVACAINE HCL (PF) 0.5 % IJ SOLN
INTRAMUSCULAR | Status: DC | PRN
  Administered 2013-07-10: 30 mL

## 2013-07-10 MED ORDER — FENTANYL CITRATE 0.05 MG/ML IJ SOLN
INTRAMUSCULAR | Status: DC | PRN
  Administered 2013-07-10: 100 ug via INTRAVENOUS

## 2013-07-10 MED ORDER — PROPOFOL INFUSION 10 MG/ML OPTIME
INTRAVENOUS | Status: DC | PRN
  Administered 2013-07-10: 25 ug/kg/min via INTRAVENOUS

## 2013-07-10 MED ORDER — EPHEDRINE SULFATE 50 MG/ML IJ SOLN
INTRAMUSCULAR | Status: AC
  Filled 2013-07-10: qty 1

## 2013-07-10 MED ORDER — FENTANYL CITRATE 0.05 MG/ML IJ SOLN
INTRAMUSCULAR | Status: AC
  Filled 2013-07-10: qty 5

## 2013-07-10 MED ORDER — SODIUM CHLORIDE 0.9 % IV SOLN
INTRAVENOUS | Status: DC
  Administered 2013-07-10: 07:00:00 via INTRAVENOUS

## 2013-07-10 MED ORDER — LIDOCAINE HCL (CARDIAC) 20 MG/ML IV SOLN
INTRAVENOUS | Status: AC
  Filled 2013-07-10: qty 5

## 2013-07-10 MED ORDER — LIDOCAINE HCL (CARDIAC) 20 MG/ML IV SOLN
INTRAVENOUS | Status: DC | PRN
  Administered 2013-07-10: 30 mg via INTRAVENOUS

## 2013-07-10 MED ORDER — LIDOCAINE-EPINEPHRINE (PF) 1 %-1:200000 IJ SOLN
INTRAMUSCULAR | Status: DC | PRN
  Administered 2013-07-10: 30 mL

## 2013-07-10 SURGICAL SUPPLY — 39 items
BLADE 10 SAFETY STRL DISP (BLADE) ×3 IMPLANT
CANISTER SUCTION 2500CC (MISCELLANEOUS) ×3 IMPLANT
CLIP TI MEDIUM 24 (CLIP) ×3 IMPLANT
CLIP TI WIDE RED SMALL 24 (CLIP) ×3 IMPLANT
CORDS BIPOLAR (ELECTRODE) IMPLANT
COVER PROBE W GEL 5X96 (DRAPES) ×3 IMPLANT
COVER SURGICAL LIGHT HANDLE (MISCELLANEOUS) ×3 IMPLANT
DECANTER SPIKE VIAL GLASS SM (MISCELLANEOUS) ×3 IMPLANT
DERMABOND ADVANCED (GAUZE/BANDAGES/DRESSINGS) ×2
DERMABOND ADVANCED .7 DNX12 (GAUZE/BANDAGES/DRESSINGS) ×1 IMPLANT
ELECT REM PT RETURN 9FT ADLT (ELECTROSURGICAL) ×3
ELECTRODE REM PT RTRN 9FT ADLT (ELECTROSURGICAL) ×1 IMPLANT
GLOVE BIO SURGEON STRL SZ7 (GLOVE) ×3 IMPLANT
GLOVE BIOGEL PI IND STRL 6.5 (GLOVE) ×2 IMPLANT
GLOVE BIOGEL PI IND STRL 7.0 (GLOVE) ×2 IMPLANT
GLOVE BIOGEL PI IND STRL 7.5 (GLOVE) ×1 IMPLANT
GLOVE BIOGEL PI INDICATOR 6.5 (GLOVE) ×4
GLOVE BIOGEL PI INDICATOR 7.0 (GLOVE) ×4
GLOVE BIOGEL PI INDICATOR 7.5 (GLOVE) ×2
GLOVE SURG SS PI 6.0 STRL IVOR (GLOVE) ×3 IMPLANT
GLOVE SURG SS PI 6.5 STRL IVOR (GLOVE) ×3 IMPLANT
GOWN STRL REUS W/ TWL LRG LVL3 (GOWN DISPOSABLE) ×3 IMPLANT
GOWN STRL REUS W/TWL LRG LVL3 (GOWN DISPOSABLE) ×6
KIT BASIN OR (CUSTOM PROCEDURE TRAY) ×3 IMPLANT
KIT ROOM TURNOVER OR (KITS) ×3 IMPLANT
NS IRRIG 1000ML POUR BTL (IV SOLUTION) ×3 IMPLANT
PACK CV ACCESS (CUSTOM PROCEDURE TRAY) ×3 IMPLANT
PAD ARMBOARD 7.5X6 YLW CONV (MISCELLANEOUS) ×6 IMPLANT
SPONGE SURGIFOAM ABS GEL 100 (HEMOSTASIS) IMPLANT
SUT MNCRL AB 4-0 PS2 18 (SUTURE) ×3 IMPLANT
SUT PROLENE 6 0 BV (SUTURE) ×3 IMPLANT
SUT PROLENE 7 0 BV 1 (SUTURE) ×3 IMPLANT
SUT SILK 2 0 SH (SUTURE) ×3 IMPLANT
SUT VIC AB 3-0 SH 27 (SUTURE) ×2
SUT VIC AB 3-0 SH 27X BRD (SUTURE) ×1 IMPLANT
TOWEL OR 17X24 6PK STRL BLUE (TOWEL DISPOSABLE) ×3 IMPLANT
TOWEL OR 17X26 10 PK STRL BLUE (TOWEL DISPOSABLE) ×3 IMPLANT
UNDERPAD 30X30 INCONTINENT (UNDERPADS AND DIAPERS) ×3 IMPLANT
WATER STERILE IRR 1000ML POUR (IV SOLUTION) ×3 IMPLANT

## 2013-07-10 NOTE — Anesthesia Postprocedure Evaluation (Signed)
Anesthesia Post Note  Patient: Scott [name]  Procedure(s) Performed: Procedure(s) (LRB): BASILIC VEIN TRANSPOSITION- FIRST STAGE  (Right)  Anesthesia type: general  Patient location: PACU  Post pain: Pain level controlled  Post assessment: Patient's Cardiovascular Status Stable  Last Vitals:  Filed Vitals:   07/10/13 0910  BP: 128/77  Pulse: 54  Temp:   Resp:     Post vital signs: Reviewed and stable  Level of consciousness: sedated  Complications: No apparent anesthesia complications

## 2013-07-10 NOTE — Interval H&P Note (Signed)
History and Physical Interval Note:  07/10/2013 7:03 AM  Scott [name]  has presented today for surgery, with the diagnosis of Chronic Kidney Disease  The various methods of treatment have been discussed with the patient and family. After consideration of risks, benefits and other options for treatment, the patient has consented to  Procedure(s): BASILIC VEIN TRANSPOSITION- FIRST STAGE  (Right) as a surgical intervention .  The patient's history has been reviewed, patient examined, no change in status, stable for surgery.  I have reviewed the patient's chart and labs.  Questions were answered to the patient's satisfaction.     Conrad [place]

## 2013-07-10 NOTE — H&P (View-Only) (Signed)
Referred by:  Bufford Buttner, MD [place], [place] [address]  Reason for referral: New access  History of Present Illness  Scott [name] is a 73 y.o. ([birth year]) male who presents for evaluation for permanent access.  The patient is right hand dominant.  The patient has not had previous access procedures.  Previous central venous cannulation procedures include: PPM.  The patient has had a L PPM placed.   Past Medical History  Diagnosis Date  . Renal disorder   . Pacemaker   . Shortness of breath     "when I take one of my chemo pills" (03/17/2013)  . Daily headache   . Arthritis     "little in my right shoulder" (03/17/2013)  . Protein calorie malnutrition   . Pneumonia 06/09/2013  . Multiple myeloma   . Skin cancer of face     "once; left side of my face" (03/17/2013)    Past Surgical History  Procedure Laterality Date  . Appendectomy    . Kidney surgery Right 2000's    "cut out ~ 1/2"   . Insert / replace / remove pacemaker      History   Social History  . Marital Status: Married    Spouse Name: N/A    Number of Children: N/A  . Years of Education: N/A   Occupational History  . Not on file.   Social History Main Topics  . Smoking status: Current Every Day Smoker -- 1.00 packs/day for 60 years    Types: Cigarettes  . Smokeless tobacco: Never Used     Comment: 03/17/2013 "smoked a cigarette yesterday; sometimes none"  . Alcohol Use: No  . Drug Use: No  . Sexual Activity: No   Other Topics Concern  . Not on file   Social History Narrative  . No narrative on file   Family History: patient cannot remember significant family medical problems  Current Outpatient Prescriptions on File Prior to Visit  Medication Sig Dispense Refill  . acyclovir (ZOVIRAX) 400 MG tablet Take 400 mg by mouth daily.      Marland Kitchen amiodarone (PACERONE) 200 MG tablet Take 200 mg by mouth daily.      Marland Kitchen dexamethasone (DECADRON) 4 MG tablet Take 40 mg by mouth every 7 (seven)  days. Every Tuesday.      . ferrous sulfate 325 (65 FE) MG tablet Take 325 mg by mouth daily with breakfast.      . furosemide (LASIX) 80 MG tablet Take 160 mg by mouth daily.      . megestrol (MEGACE) 40 MG/ML suspension Take 800 mg by mouth daily.      . tamsulosin (FLOMAX) 0.4 MG CAPS capsule Take 0.4 mg by mouth daily after breakfast.      . levofloxacin (LEVAQUIN) 500 MG tablet Take 1 tablet (500 mg total) by mouth every other day.  5 tablet  0   No current facility-administered medications on file prior to visit.    No Known Allergies  REVIEW OF SYSTEMS:  (Positives checked otherwise negative)  CARDIOVASCULAR:  [] chest pain, [] chest pressure, [] palpitations, [x] shortness of breath when laying flat, [x] shortness of breath with exertion,  [] pain in feet when walking, [] pain in feet when laying flat, [] history of blood clot in veins (DVT), [] history of phlebitis, [] swelling in legs, [] varicose veins  PULMONARY:  [] productive cough, [] asthma, [] wheezing  NEUROLOGIC:  [x] weakness in arms  or legs, [] numbness in arms or legs, [] difficulty speaking or slurred speech, [] temporary loss of vision in one eye, [] dizziness  HEMATOLOGIC:  [] bleeding problems, [] problems with blood clotting too easily  MUSCULOSKEL:  [] joint pain, [] joint swelling  GASTROINTEST:  [] vomiting blood, [] blood in stool     GENITOURINARY:  [] burning with urination, [] blood in urine  PSYCHIATRIC:  [] history of major depression  INTEGUMENTARY:  [] rashes, [] ulcers  CONSTITUTIONAL:  [] fever, [] chills  Physical Examination  Filed Vitals:   07/06/13 1525  BP: 113/69  Pulse: 89  Resp: 16  Height: 5' 5" (1.651 m)  Weight: 178 lb 4.8 oz (80.876 kg)   Body mass index is 29.67 kg/(m^2).  General: A&O x 3, WDWN  Head: [place]/AT  Ear/Nose/Throat: Hearing grossly intact, nares w/o erythema or drainage, oropharynx w/o Erythema/Exudate, Mallampati score: 3  Eyes: PERRLA, EOMI  Neck:  Supple, no nuchal rigidity, no palpable LAD  Pulmonary: Sym exp, good air movt, CTAB, no rales, rhonchi, & wheezing  Cardiac: RRR, Nl S1, S2, no Murmurs, rubs or gallops  Vascular: Vessel Right Left  Radial Palpable Palpable  Ulnar Palpable Palpable  Brachial Palpable Palpable  Carotid Palpable, without bruit Palpable, without bruit  Aorta Not palpable N/A  Femoral Palpable Palpable  Popliteal Not palpable Not palpable  PT Palpable Palpable  DP Palpable Palpable   Gastrointestinal: soft, NTND, -G/R, - HSM, - masses, - CVAT B  Musculoskeletal: M/S 5/5 throughout , Extremities without ischemic changes   Neurologic: CN 2-12 intact , Pain and light touch intact in extremities , Motor exam as listed above  Psychiatric: Judgment intact, Mood & affect appropriate for pt's clinical situation  Dermatologic: See M/S exam for extremity exam, no rashes otherwise noted  Lymph : No Cervical, Axillary, or Inguinal lymphadenopathy   Non-Invasive Vascular Imaging  Vein Mapping  (Date: 07/06/2013):   R arm: acceptable vein conduits include upper arm basilic   L arm: acceptable vein conduits include upper arm basilic  BUE Doppler (Date: 07/06/2013):   R arm:   Brachial: tri  Radial: tri  Ulnar: tri  L arm:   Brachial: tri  Radial: tri  Ulnar: tri  Outside Studies/Documentation 1 pages of outside documents were reviewed including: outpatient nephrology chart.  Medical Decision Making  Scott [name] is a 73 y.o. male who presents with chronic kidney disease stage IV  Based on vein mapping and examination, this patient's permanent access options include: R staged BVT.  His left arm is no longer an option for access due to PPM.  I had an extensive discussion with this patient in regards to the nature of access surgery, including risk, benefits, and alternatives.    The patient is aware that the risks of access surgery include but are not limited to: bleeding, infection,  steal syndrome, nerve damage, ischemic monomelic neuropathy, failure of access to mature, and possible need for additional access procedures in the future.  I discussed with the patient the nature of the staged access procedure, specifically the need for a second operation to transpose the first stage fistula if it matures adequately.    The patient has agreed to proceed with the above procedure which will be scheduled: 27 APR 15.  Adele Barthel, MD [facility] Office: [phone] Pager: [phone]  07/06/2013, 5:26 PM

## 2013-07-10 NOTE — Progress Notes (Signed)
Dr. Massage notified of potassium of 2.9. Notified of pacemaker on left side. No further orders given.

## 2013-07-10 NOTE — Transfer of Care (Signed)
Immediate Anesthesia Transfer of Care Note  Patient: Scott [name]  Procedure(s) Performed: Procedure(s): BASILIC VEIN TRANSPOSITION- FIRST STAGE  (Right)  Patient Location: PACU  Anesthesia Type:MAC  Level of Consciousness: awake, alert  and oriented  Airway & Oxygen Therapy: Patient Spontanous Breathing and Patient connected to nasal cannula oxygen  Post-op Assessment: Report given to PACU RN and Post -op Vital signs reviewed and stable  Post vital signs: Reviewed and stable  Complications: No apparent anesthesia complications

## 2013-07-10 NOTE — Anesthesia Preprocedure Evaluation (Addendum)
Anesthesia Evaluation  Patient identified by MRN, date of birth, ID band Patient awake    Reviewed: Allergy & Precautions, H&P , NPO status , Patient's Chart, lab work & pertinent test results  Airway Mallampati: II TM Distance: >3 FB Neck ROM: Full    Dental  (+) Missing, Edentulous Upper, Partial Lower   Pulmonary Current Smoker,          Cardiovascular + pacemaker Rhythm:Irregular     Neuro/Psych    GI/Hepatic   Endo/Other  diabetes  Renal/GU CRFRenal disease     Musculoskeletal   Abdominal   Peds  Hematology  (+) anemia ,   Anesthesia Other Findings   Reproductive/Obstetrics                          Anesthesia Physical Anesthesia Plan  ASA: III  Anesthesia Plan: MAC   Post-op Pain Management:    Induction: Intravenous  Airway Management Planned: Simple Face Mask  Additional Equipment:   Intra-op Plan:   Post-operative Plan:   Informed Consent: I have reviewed the patients History and Physical, chart, labs and discussed the procedure including the risks, benefits and alternatives for the proposed anesthesia with the patient or authorized representative who has indicated his/her understanding and acceptance.   Dental advisory given  Plan Discussed with: Surgeon and CRNA  Anesthesia Plan Comments:        Anesthesia Quick Evaluation

## 2013-07-10 NOTE — Telephone Encounter (Addendum)
Message copied by Gena Fray on Mon Jul 10, 2013  2:26 PM ------      Message from: [place], [place] K      Created: Mon Jul 10, 2013  8:38 AM      Regarding: Schedule                   ----- Message -----         From: Conrad Lawtell, MD         Sent: 07/10/2013   8:33 AM           To: [facility] [address]            SIMRAN MANNIS      [id number]      September 25, 1940            Procedure: right first stage basilic vein transposition (brachiobasilic arteriovenous fistula) placement            Follow-up: 6 weeks       ------  07/10/13: spoke with pt, dpm

## 2013-07-10 NOTE — Op Note (Signed)
OPERATIVE NOTE   PROCEDURE: 1. right first stage basilic vein transposition (brachiobasilic arteriovenous fistula) placement  PRE-OPERATIVE DIAGNOSIS: chronic kidney disease stage IV   POST-OPERATIVE DIAGNOSIS: same as above   SURGEON: Adele Barthel, MD  ANESTHESIA: local and MAC  ESTIMATED BLOOD LOSS: 50 cc  FINDING(S): 1. Palpable thrill at end of case with palpable radial pulse  SPECIMEN(S):  none  INDICATIONS:   Scott [name] is a 73 y.o. male who presents with chronic kidney disease stage IV.  The patient is scheduled for right first stage basilic vein transposition.  The patient is aware the risks include but are not limited to: bleeding, infection, steal syndrome, nerve damage, ischemic monomelic neuropathy, failure to mature, and need for additional procedures.  The patient is aware of the risks of the procedure and elects to proceed forward.  DESCRIPTION: After full informed written consent was obtained from the patient, the patient was brought back to the operating room and placed supine upon the operating table.  Prior to induction, the patient received IV antibiotics.   After obtaining adequate anesthesia, the patient was then prepped and draped in the standard fashion for a right arm access procedure.  I turned my attention first to identifying the patient's basilic vein and brachial artery.  Using SonoSite guidance, the location of these vessels were marked out on the skin.   At this point, I injected local anesthetic to obtain a field block of the antecubitum.  In total, I injected about 10 mL of a 1:1 mixture of 0.5% Marcaine without epinephrine and 1% lidocaine with epinephrine.  I made a transverse incision at the level of the antecubitum and dissected through the subcutaneous tissue and fascia to gain exposure of the brachial artery.  This was noted to be 4-5 mm in diameter externally.  This was dissected out proximally and distally and controlled with vessel loops .   I then started to dissected out the basilic vein but encountered an acceptable cubital vein in the process which appeared to be draining into the basilic vein.  This was noted to be 3 mm in diameter externally.  The distal segment of the vein was ligated with a  2-0 silk, and the vein was transected.  The proximal segment was iinterrogated with serial dilators.  The vein accepted up to a 3 mm dilator without any difficulty.  I then instilled the heparinized saline into the vein and clamped it.  At this point, I reset my exposure of the brachial artery and placed the artery under tension proximally and distally.  I made an arteriotomy with a #11 blade, and then I extended the arteriotomy with a Potts scissor.  I injected heparinized saline proximal and distal to this arteriotomy.  The vein was then sewn to the artery in an end-to-side configuration with a running stitch of 7-0 Prolene.  Prior to completing this anastomosis, I allowed the vein and artery to backbleed.  There was no evidence of clot from any vessels.  I completed the anastomosis in the usual fashion and then released all vessel loops and clamps.  There was a palpable thrill in the venous outflow, and there was a palpable radial pulse.  At this point, I irrigated out the surgical wound.  There was no further active bleeding.  The subcutaneous tissue was reapproximated with a running stitch of 3-0 Vicryl.  The skin was then reapproximated with a running subcuticular stitch of 4-0 Vicryl.  The skin was then cleaned, dried,  and reinforced with Dermabond.  The patient tolerated this procedure well.   COMPLICATIONS: none  CONDITION: stable  Adele Barthel, MD [facility] Office: [phone] Pager: [phone]  07/10/2013, 8:30 AM

## 2013-07-10 NOTE — Discharge Instructions (Signed)
What to eat: ° °For your first meals, you should eat lightly; only small meals initially.  If you do not have nausea, you may eat larger meals.  Avoid spicy, greasy and heavy food.   ° °General Anesthesia, Adult, Care After  °Refer to this sheet in the next few weeks. These instructions provide you with information on caring for yourself after your procedure. Your health care provider may also give you more specific instructions. Your treatment has been planned according to current medical practices, but problems sometimes occur. Call your health care provider if you have any problems or questions after your procedure.  °WHAT TO EXPECT AFTER THE PROCEDURE  °After the procedure, it is typical to experience:  °Sleepiness.  °Nausea and vomiting. °HOME CARE INSTRUCTIONS  °For the first 24 hours after general anesthesia:  °Have a responsible person with you.  °Do not drive a car. If you are alone, do not take public transportation.  °Do not drink alcohol.  °Do not take medicine that has not been prescribed by your health care provider.  °Do not sign important papers or make important decisions.  °You may resume a normal diet and activities as directed by your health care provider.  °Change bandages (dressings) as directed.  °If you have questions or problems that seem related to general anesthesia, call the hospital and ask for the anesthetist or anesthesiologist on call. °SEEK MEDICAL CARE IF:  °You have nausea and vomiting that continue the day after anesthesia.  °You develop a rash. °SEEK IMMEDIATE MEDICAL CARE IF:  °You have difficulty breathing.  °You have chest pain.  °You have any allergic problems. °Document Released: 06/08/2000 Document Revised: 11/02/2012 Document Reviewed: 09/15/2012  °ExitCare® Patient Information ©2014 [employer].  ° ° °Laceration Care, Adult  ° ° °A laceration is a cut that goes through all layers of the skin. The cut goes into the tissue beneath the skin.  °HOME CARE  °For stitches  (sutures) or staples:  °Keep the cut clean and dry.  °If you have a bandage (dressing), change it at least once a day. Change the bandage if it gets wet or dirty, or as told by your doctor.  °Wash the cut with soap and water 2 times a day. Rinse the cut with water. Pat it dry with a clean towel.  °Put a thin layer of medicated cream on the cut as told by your doctor.  °You may shower after the first 24 hours. Do not soak the cut in water until the stitches are removed.  °Only take medicines as told by your doctor.  °Have your stitches or staples removed as told by your doctor. °For skin adhesive strips:  °Keep the cut clean and dry.  °Do not get the strips wet. You may take a bath, but be careful to keep the cut dry.  °If the cut gets wet, pat it dry with a clean towel.  °The strips will fall off on their own. Do not remove the strips that are still stuck to the cut. °For wound glue:  °You may shower or take baths. Do not soak or scrub the cut. Do not swim. Avoid heavy sweating until the glue falls off on its own. After a shower or bath, pat the cut dry with a clean towel.  °Do not put medicine on your cut until the glue falls off.  °If you have a bandage, do not put tape over the glue.  °Avoid lots of sunlight or tanning lamps until   the glue falls off. Put sunscreen on the cut for the first year to reduce your scar.  °The glue will fall off on its own. Do not pick at the glue. °You may need a tetanus shot if:  °You cannot remember when you had your last tetanus shot.  °You have never had a tetanus shot. °If you need a tetanus shot and you choose not to have one, you may get tetanus. Sickness from tetanus can be serious.  °GET HELP RIGHT AWAY IF:  °Your pain does not get better with medicine.  °Your arm, hand, leg, or foot loses feeling (numbness) or changes color.  °Your cut is bleeding.  °Your joint feels weak, or you cannot use your joint.  °You have painful lumps on your body.  °Your cut is red, puffy (swollen),  or painful.  °You have a red line on the skin near the cut.  °You have yellowish-white fluid (pus) coming from the cut.  °You have a fever.  °You have a bad smell coming from the cut or bandage.  °Your cut breaks open before or after stitches are removed.  °You notice something coming out of the cut, such as wood or glass.  °You cannot move a finger or toe. °MAKE SURE YOU:  °Understand these instructions.  °Will watch your condition.  °Will get help right away if you are not doing well or get worse. °Document Released: 08/19/2007 Document Revised: 05/25/2011 Document Reviewed: 08/26/2010  °ExitCare® Patient Information ©2014 [employer].  ° ° °

## 2013-07-13 ENCOUNTER — Encounter (HOSPITAL_COMMUNITY): Payer: Self-pay | Admitting: Vascular Surgery

## 2013-08-23 ENCOUNTER — Inpatient Hospital Stay (HOSPITAL_COMMUNITY)
Admission: AD | Admit: 2013-08-23 | Discharge: 2013-09-08 | DRG: 673 | Disposition: A | Discharge status: Skilled Nursing Facility | Payer: Medicare HMO | Source: Other Acute Inpatient Hospital | Attending: Internal Medicine | Admitting: Internal Medicine

## 2013-08-23 ENCOUNTER — Encounter (HOSPITAL_COMMUNITY): Payer: Self-pay | Admitting: Internal Medicine

## 2013-08-23 ENCOUNTER — Inpatient Hospital Stay (HOSPITAL_COMMUNITY): Payer: Medicare HMO

## 2013-08-23 DIAGNOSIS — N186 End stage renal disease: Secondary | ICD-10-CM | POA: Diagnosis present

## 2013-08-23 DIAGNOSIS — Z950 Presence of cardiac pacemaker: Secondary | ICD-10-CM

## 2013-08-23 DIAGNOSIS — Z85828 Personal history of other malignant neoplasm of skin: Secondary | ICD-10-CM

## 2013-08-23 DIAGNOSIS — C9000 Multiple myeloma not having achieved remission: Secondary | ICD-10-CM | POA: Diagnosis present

## 2013-08-23 DIAGNOSIS — E875 Hyperkalemia: Secondary | ICD-10-CM

## 2013-08-23 DIAGNOSIS — Z6825 Body mass index (BMI) 25.0-25.9, adult: Secondary | ICD-10-CM

## 2013-08-23 DIAGNOSIS — E43 Unspecified severe protein-calorie malnutrition: Secondary | ICD-10-CM

## 2013-08-23 DIAGNOSIS — D61818 Other pancytopenia: Secondary | ICD-10-CM

## 2013-08-23 DIAGNOSIS — R0602 Shortness of breath: Secondary | ICD-10-CM

## 2013-08-23 DIAGNOSIS — N184 Chronic kidney disease, stage 4 (severe): Secondary | ICD-10-CM

## 2013-08-23 DIAGNOSIS — D696 Thrombocytopenia, unspecified: Secondary | ICD-10-CM | POA: Diagnosis present

## 2013-08-23 DIAGNOSIS — D631 Anemia in chronic kidney disease: Secondary | ICD-10-CM | POA: Diagnosis present

## 2013-08-23 DIAGNOSIS — N170 Acute kidney failure with tubular necrosis: Principal | ICD-10-CM | POA: Diagnosis present

## 2013-08-23 DIAGNOSIS — R197 Diarrhea, unspecified: Secondary | ICD-10-CM | POA: Diagnosis present

## 2013-08-23 DIAGNOSIS — E872 Acidosis, unspecified: Secondary | ICD-10-CM | POA: Diagnosis present

## 2013-08-23 DIAGNOSIS — R6510 Systemic inflammatory response syndrome (SIRS) of non-infectious origin without acute organ dysfunction: Secondary | ICD-10-CM | POA: Diagnosis present

## 2013-08-23 DIAGNOSIS — F17200 Nicotine dependence, unspecified, uncomplicated: Secondary | ICD-10-CM | POA: Diagnosis present

## 2013-08-23 DIAGNOSIS — E876 Hypokalemia: Secondary | ICD-10-CM | POA: Diagnosis present

## 2013-08-23 DIAGNOSIS — N189 Chronic kidney disease, unspecified: Secondary | ICD-10-CM

## 2013-08-23 DIAGNOSIS — T380X5A Adverse effect of glucocorticoids and synthetic analogues, initial encounter: Secondary | ICD-10-CM | POA: Diagnosis present

## 2013-08-23 DIAGNOSIS — J9600 Acute respiratory failure, unspecified whether with hypoxia or hypercapnia: Secondary | ICD-10-CM | POA: Diagnosis not present

## 2013-08-23 DIAGNOSIS — N179 Acute kidney failure, unspecified: Secondary | ICD-10-CM

## 2013-08-23 DIAGNOSIS — I120 Hypertensive chronic kidney disease with stage 5 chronic kidney disease or end stage renal disease: Secondary | ICD-10-CM | POA: Diagnosis present

## 2013-08-23 DIAGNOSIS — N039 Chronic nephritic syndrome with unspecified morphologic changes: Secondary | ICD-10-CM

## 2013-08-23 DIAGNOSIS — D6481 Anemia due to antineoplastic chemotherapy: Secondary | ICD-10-CM | POA: Diagnosis present

## 2013-08-23 DIAGNOSIS — Z79899 Other long term (current) drug therapy: Secondary | ICD-10-CM

## 2013-08-23 DIAGNOSIS — A0472 Enterocolitis due to Clostridium difficile, not specified as recurrent: Secondary | ICD-10-CM | POA: Diagnosis present

## 2013-08-23 DIAGNOSIS — D649 Anemia, unspecified: Secondary | ICD-10-CM | POA: Diagnosis present

## 2013-08-23 DIAGNOSIS — J9601 Acute respiratory failure with hypoxia: Secondary | ICD-10-CM | POA: Diagnosis present

## 2013-08-23 DIAGNOSIS — E162 Hypoglycemia, unspecified: Secondary | ICD-10-CM

## 2013-08-23 DIAGNOSIS — F411 Generalized anxiety disorder: Secondary | ICD-10-CM | POA: Diagnosis present

## 2013-08-23 DIAGNOSIS — I959 Hypotension, unspecified: Secondary | ICD-10-CM | POA: Diagnosis present

## 2013-08-23 DIAGNOSIS — T451X5A Adverse effect of antineoplastic and immunosuppressive drugs, initial encounter: Secondary | ICD-10-CM | POA: Diagnosis present

## 2013-08-23 DIAGNOSIS — E119 Type 2 diabetes mellitus without complications: Secondary | ICD-10-CM | POA: Diagnosis present

## 2013-08-23 DIAGNOSIS — J189 Pneumonia, unspecified organism: Secondary | ICD-10-CM

## 2013-08-23 LAB — CBC WITH DIFFERENTIAL/PLATELET
Basophils Absolute: 0 10*3/uL (ref 0.0–0.1)
Basophils Relative: 0 % (ref 0–1)
EOS PCT: 0 % (ref 0–5)
Eosinophils Absolute: 0 10*3/uL (ref 0.0–0.7)
HCT: 26.2 % — ABNORMAL LOW (ref 39.0–52.0)
HEMOGLOBIN: 8.8 g/dL — AB (ref 13.0–17.0)
LYMPHS PCT: 4 % — AB (ref 12–46)
Lymphs Abs: 0.4 10*3/uL — ABNORMAL LOW (ref 0.7–4.0)
MCH: 29.2 pg (ref 26.0–34.0)
MCHC: 33.6 g/dL (ref 30.0–36.0)
MCV: 87 fL (ref 78.0–100.0)
MONO ABS: 0.4 10*3/uL (ref 0.1–1.0)
MONOS PCT: 3 % (ref 3–12)
Neutro Abs: 11.2 10*3/uL — ABNORMAL HIGH (ref 1.7–7.7)
Neutrophils Relative %: 93 % — ABNORMAL HIGH (ref 43–77)
Platelets: 71 10*3/uL — ABNORMAL LOW (ref 150–400)
RBC: 3.01 MIL/uL — AB (ref 4.22–5.81)
RDW: 15.6 % — ABNORMAL HIGH (ref 11.5–15.5)
WBC: 12 10*3/uL — AB (ref 4.0–10.5)

## 2013-08-23 LAB — COMPREHENSIVE METABOLIC PANEL
ALBUMIN: 2.6 g/dL — AB (ref 3.5–5.2)
ALT: 8 U/L (ref 0–53)
AST: 14 U/L (ref 0–37)
Alkaline Phosphatase: 54 U/L (ref 39–117)
BUN: 107 mg/dL — AB (ref 6–23)
CO2: 17 mEq/L — ABNORMAL LOW (ref 19–32)
CREATININE: 5.52 mg/dL — AB (ref 0.50–1.35)
Calcium: 8.7 mg/dL (ref 8.4–10.5)
Chloride: 98 mEq/L (ref 96–112)
GFR calc Af Amer: 11 mL/min — ABNORMAL LOW (ref >90)
GFR calc non Af Amer: 9 mL/min — ABNORMAL LOW (ref >90)
Glucose, Bld: 185 mg/dL — ABNORMAL HIGH (ref 70–99)
POTASSIUM: 3.2 meq/L — AB (ref 3.7–5.3)
Sodium: 139 mEq/L (ref 137–147)
Total Bilirubin: 0.3 mg/dL (ref 0.3–1.2)
Total Protein: 5.9 g/dL — ABNORMAL LOW (ref 6.0–8.3)

## 2013-08-23 LAB — MRSA PCR SCREENING: MRSA by PCR: NEGATIVE

## 2013-08-23 LAB — PHOSPHORUS: Phosphorus: 7.3 mg/dL — ABNORMAL HIGH (ref 2.3–4.6)

## 2013-08-23 LAB — OCCULT BLOOD X 1 CARD TO LAB, STOOL: Fecal Occult Bld: NEGATIVE

## 2013-08-23 MED ORDER — PANTOPRAZOLE SODIUM 40 MG IV SOLR
40.0000 mg | Freq: Two times a day (BID) | INTRAVENOUS | Status: DC
  Administered 2013-08-24 (×2): 40 mg via INTRAVENOUS
  Filled 2013-08-23 (×5): qty 40

## 2013-08-23 MED ORDER — TAMSULOSIN HCL 0.4 MG PO CAPS
0.4000 mg | ORAL_CAPSULE | Freq: Every day | ORAL | Status: DC
  Administered 2013-08-24 – 2013-08-30 (×7): 0.4 mg via ORAL
  Filled 2013-08-23 (×8): qty 1

## 2013-08-23 MED ORDER — ACYCLOVIR 400 MG PO TABS
400.0000 mg | ORAL_TABLET | Freq: Every day | ORAL | Status: DC
  Administered 2013-08-24 – 2013-09-08 (×14): 400 mg via ORAL
  Filled 2013-08-23 (×16): qty 1

## 2013-08-23 MED ORDER — ACETAMINOPHEN 650 MG RE SUPP: 650.0000 mg | Freq: Four times a day (QID) | RECTAL | Status: DC | PRN

## 2013-08-23 MED ORDER — DEXAMETHASONE 6 MG PO TABS: 40.0000 mg | ORAL_TABLET | ORAL | Status: DC

## 2013-08-23 MED ORDER — SODIUM CHLORIDE 0.9 % IJ SOLN
3.0000 mL | Freq: Two times a day (BID) | INTRAMUSCULAR | Status: DC
  Administered 2013-08-24 – 2013-09-08 (×21): 3 mL via INTRAVENOUS

## 2013-08-23 MED ORDER — FUROSEMIDE 80 MG PO TABS
160.0000 mg | ORAL_TABLET | Freq: Two times a day (BID) | ORAL | Status: DC
  Filled 2013-08-23: qty 2

## 2013-08-23 MED ORDER — MEGESTROL ACETATE 40 MG/ML PO SUSP
800.0000 mg | Freq: Every day | ORAL | Status: DC
  Administered 2013-08-24 – 2013-09-08 (×14): 800 mg via ORAL
  Filled 2013-08-23 (×16): qty 20

## 2013-08-23 MED ORDER — ONDANSETRON HCL 4 MG PO TABS
4.0000 mg | ORAL_TABLET | Freq: Four times a day (QID) | ORAL | Status: DC | PRN
Start: 2013-08-23 — End: 2013-09-08

## 2013-08-23 MED ORDER — AMIODARONE HCL 200 MG PO TABS
200.0000 mg | ORAL_TABLET | Freq: Every day | ORAL | Status: DC
  Administered 2013-08-24 – 2013-09-08 (×14): 200 mg via ORAL
  Filled 2013-08-23 (×16): qty 1

## 2013-08-23 MED ORDER — INSULIN ASPART 100 UNIT/ML ~~LOC~~ SOLN
0.0000 [IU] | Freq: Three times a day (TID) | SUBCUTANEOUS | Status: DC
  Administered 2013-08-24: 3 [IU] via SUBCUTANEOUS
  Administered 2013-08-24: 2 [IU] via SUBCUTANEOUS
  Administered 2013-08-26 – 2013-08-27 (×3): 1 [IU] via SUBCUTANEOUS

## 2013-08-23 MED ORDER — ACETAMINOPHEN 325 MG PO TABS: 650.0000 mg | ORAL_TABLET | Freq: Four times a day (QID) | ORAL | Status: DC | PRN

## 2013-08-23 MED ORDER — ONDANSETRON HCL 4 MG/2ML IJ SOLN: 4.0000 mg | Freq: Four times a day (QID) | INTRAMUSCULAR | Status: DC | PRN

## 2013-08-23 MED ORDER — FERROUS SULFATE 325 (65 FE) MG PO TABS
325.0000 mg | ORAL_TABLET | Freq: Every day | ORAL | Status: DC
  Administered 2013-08-24 – 2013-08-29 (×6): 325 mg via ORAL
  Filled 2013-08-23 (×7): qty 1

## 2013-08-23 NOTE — H&P (Signed)
[facility] History and Physical  Scott [name] KKX:[id number] DOB: 09-Dec-1940 DOA: 08/23/2013  Referring physician: ER physician. Patient was transferred from [facility]. PCP: Bufford Buttner, MD  Chief Complaint: Shortness of breath and diarrhea.  HPI: Scott [name] is a 73 y.o. male with history of multiple myeloma and chemotherapy every Tuesday last one was yesterday, chronic kidney disease, anemia, pacemaker placement has been experiencing diarrhea for last one week. Patient has been having at least 4-5 episodes of watery diarrhea which was dark in color. Denies any abdominal pain or nausea vomiting. Patient became short of breath today and also is noticing swelling in his lower extremities over the last few days. In the ER at [facility] patient's chest x-ray showed vascular congestion. Patient was found to be hypotensive and creatinine was found to be increased from baseline as per our records patient's creatinine was around 3.5 last March 3 months ago and at [facility] it was around 5.9. Patient since he was hypotensive with systolic blood pressure in the 80s was given 1 L normal saline bolus following which his blood pressure has largely improved. Patient was transferred to [facility] as patient has worsening renal function. Presently patient pressure is in the 169 systolic. Patient is not in acute respiratory distress. Denies any fever chills. Has some cough nonproductive. Gets short of breath with exertion. On exam does have mild edema of the lower extremity. Patient has an AV fistula in the right arm and was originally to followup with vascular surgeon next week for patency.  Review of Systems: As presented in the history of presenting illness, rest negative.  Past Medical History  Diagnosis Date  . Renal disorder   . Pacemaker   . Shortness of breath     "when I take one of my chemo pills" (03/17/2013)  . Protein calorie malnutrition   . Multiple myeloma    . Skin cancer of face     "once; left side of my face" (03/17/2013)  . Pneumonia 06/09/2013  . Diabetes mellitus without complication     no meds  . Arthritis     "little in my right shoulder" (03/17/2013)  . History of blood transfusion   . Daily headache     denies  . Cataract    Past Surgical History  Procedure Laterality Date  . Appendectomy    . Kidney surgery Right 2000's    "cut out ~ 1/2"   . Insert / replace / remove pacemaker    . Bascilic vein transposition Right 07/10/2013    Procedure: BASILIC VEIN TRANSPOSITION- FIRST STAGE ;  Surgeon: Conrad Buckingham, MD;  Location: [place];  Service: Vascular;  Laterality: Right;   Social History:  reports that he has been smoking Cigarettes.  He has a 15 pack-year smoking history. He has never used smokeless tobacco. He reports that he does not drink alcohol or use illicit drugs. Where does patient live home. Can patient participate in ADLs? Yes.  No Known Allergies  Family History:  Family History  Problem Relation Age of Onset  . Diabetes Mellitus II Father       Prior to Admission medications   Medication Sig Start Date End Date Taking? Authorizing Provider  ACCU-CHEK AVIVA PLUS test strip  05/31/13  Yes Historical Provider, MD  ACCU-CHEK SOFTCLIX LANCETS lancets  05/31/13  Yes Historical Provider, MD  acyclovir (ZOVIRAX) 400 MG tablet Take 400 mg by mouth daily.   Yes Historical Provider, MD  amiodarone (PACERONE) 200  MG tablet Take 200 mg by mouth daily.   Yes Historical Provider, MD  dexamethasone (DECADRON) 4 MG tablet Take 40 mg by mouth every 7 (seven) days. Every Tuesday.   Yes Historical Provider, MD  ferrous sulfate 325 (65 FE) MG tablet Take 325 mg by mouth daily with breakfast.   Yes Historical Provider, MD  furosemide (LASIX) 80 MG tablet Take 180-240 mg by mouth 2 (two) times daily. Take 3 tablets by mouth in AM and 2 tablets by mouth in PM   Yes Historical Provider, MD  megestrol (MEGACE) 40 MG/ML suspension Take 800  mg by mouth daily.   Yes Historical Provider, MD  tamsulosin (FLOMAX) 0.4 MG CAPS capsule Take 0.4 mg by mouth daily after breakfast.   Yes Historical Provider, MD    Physical Exam: Filed Vitals:   08/23/13 1844 08/23/13 2019  BP: 121/65 119/58  Pulse: 60 60  Temp: 98 F (36.7 C) 97.3 F (36.3 C)  TempSrc: Oral Oral  Resp: 17 20  Height: 5' 5"  (1.651 m)   Weight: 68.8 kg (151 lb 10.8 oz)   SpO2: 95% 98%     General:  Well-developed and poorly nourished.  Eyes: Anicteric no pallor.  ENT: No discharge from the ears eyes nose mouth.  Neck: No mass felt.  Cardiovascular: S1-S2 heard.  Respiratory: No rhonchi or crepitations.  Abdomen: Soft nontender bowel sounds present. No guarding rigidity.  Skin: No rash.  Musculoskeletal: Mild edema of the lower extremity.  Psychiatric: Appears normal.  Neurologic: Alert awake oriented to time place and person. Moves all extremities.  Labs on Admission:  Basic Metabolic Panel:  Recent Labs Lab 08/23/13 2000  NA 139  K 3.2*  CL 98  CO2 17*  GLUCOSE 185*  BUN 107*  CREATININE 5.52*  CALCIUM 8.7  PHOS 7.3*   Liver Function Tests:  Recent Labs Lab 08/23/13 2000  AST 14  ALT 8  ALKPHOS 54  BILITOT 0.3  PROT 5.9*  ALBUMIN 2.6*   No results found for this basename: LIPASE, AMYLASE,  in the last 168 hours No results found for this basename: AMMONIA,  in the last 168 hours CBC:  Recent Labs Lab 08/23/13 2000  WBC 12.0*  NEUTROABS 11.2*  HGB 8.8*  HCT 26.2*  MCV 87.0  PLT 71*   Cardiac Enzymes: No results found for this basename: CKTOTAL, CKMB, CKMBINDEX, TROPONINI,  in the last 168 hours  BNP (last 3 results)  Recent Labs  06/08/13 2145  PROBNP 16467.0*   CBG: No results found for this basename: GLUCAP,  in the last 168 hours  Radiological Exams on Admission: No results found.  EKG: Independently reviewed. Sinus rhythm with LBBB.  Assessment/Plan Principal Problem:   Acute respiratory  failure Active Problems:   Multiple myeloma   Pacemaker   Renal failure (ARF), acute on chronic   Diarrhea   Hypokalemia   Anemia   1. Acute respiratory failure - probably secondary to vascular congestion. I have ordered a repeat chest x-ray. Check 2-D echo. Patient is on last dose of Lasix and probably we need IV Lasix but for now I'm holding off because patient is still having ongoing diarrhea and since admission patient has had 4 episodes of watery diarrhea. 2. Acute renal failure on chronic kidney disease - probably worsened by diarrhea and hypotension. Patient did receive 1 L normal saline bolus at the ER in [place]. For now we will hold off further fluids and closely observed intake output and metabolic  panel. Hold Lasix for now. 3. Diarrhea - check stool for C. difficile and culture. Patient's stool was dark in color and stool for occult blood was done which was negative. 4. Anemia probably related to multiple myeloma and chronic kidney disease - stool for occult blood was negative. Closely follow CBC. 5. Metabolic acidosis - probably from #2. Follow metabolic panel. 6. Thrombocytopenia - probably related to multiple myeloma and chemotherapy. Follow CBC. 7. Diabetes mellitus - patient is on sliding-scale coverage. 8. History of pacemaker placement - on amiodarone. Follow LFTs.  Repeat chest x-ray and abdominal x-ray is pending.  Consult nephrology in a.m.  Code Status: Full code.  Family Communication: Patient's wife at the bedside.  Disposition Plan: Admit to inpatient.    Elainna Eshleman N. [facility] Pager [phone].  If 7PM-7AM, please contact night-coverage [email or web address] Password [facility] 08/23/2013, 9:34 PM

## 2013-08-24 ENCOUNTER — Encounter: Payer: Self-pay | Admitting: Vascular Surgery

## 2013-08-24 DIAGNOSIS — I059 Rheumatic mitral valve disease, unspecified: Secondary | ICD-10-CM

## 2013-08-24 DIAGNOSIS — A0472 Enterocolitis due to Clostridium difficile, not specified as recurrent: Secondary | ICD-10-CM | POA: Diagnosis present

## 2013-08-24 DIAGNOSIS — R0602 Shortness of breath: Secondary | ICD-10-CM

## 2013-08-24 LAB — IRON AND TIBC
IRON: 31 ug/dL — AB (ref 42–135)
Saturation Ratios: 23 % (ref 20–55)
TIBC: 135 ug/dL — AB (ref 215–435)
UIBC: 104 ug/dL — ABNORMAL LOW (ref 125–400)

## 2013-08-24 LAB — BASIC METABOLIC PANEL
BUN: 113 mg/dL — AB (ref 6–23)
BUN: 117 mg/dL — AB (ref 6–23)
CHLORIDE: 104 meq/L (ref 96–112)
CO2: 16 meq/L — AB (ref 19–32)
CO2: 16 mEq/L — ABNORMAL LOW (ref 19–32)
CREATININE: 5.58 mg/dL — AB (ref 0.50–1.35)
Calcium: 8.8 mg/dL (ref 8.4–10.5)
Calcium: 8.8 mg/dL (ref 8.4–10.5)
Chloride: 100 mEq/L (ref 96–112)
Creatinine, Ser: 5.73 mg/dL — ABNORMAL HIGH (ref 0.50–1.35)
GFR calc Af Amer: 11 mL/min — ABNORMAL LOW (ref >90)
GFR calc Af Amer: 10 mL/min — ABNORMAL LOW (ref >90)
GFR calc non Af Amer: 9 mL/min — ABNORMAL LOW (ref >90)
GFR calc non Af Amer: 9 mL/min — ABNORMAL LOW (ref >90)
GLUCOSE: 207 mg/dL — AB (ref 70–99)
GLUCOSE: 194 mg/dL — AB (ref 70–99)
Potassium: 2.4 mEq/L — CL (ref 3.7–5.3)
Potassium: 2.5 mEq/L — CL (ref 3.7–5.3)
Sodium: 142 mEq/L (ref 137–147)
Sodium: 144 mEq/L (ref 137–147)

## 2013-08-24 LAB — URINALYSIS, ROUTINE W REFLEX MICROSCOPIC
Bilirubin Urine: NEGATIVE
GLUCOSE, UA: NEGATIVE mg/dL
KETONES UR: NEGATIVE mg/dL
Leukocytes, UA: NEGATIVE
Nitrite: NEGATIVE
PROTEIN: NEGATIVE mg/dL
Specific Gravity, Urine: 1.015 (ref 1.005–1.030)
Urobilinogen, UA: 0.2 mg/dL (ref 0.0–1.0)
pH: 5 (ref 5.0–8.0)

## 2013-08-24 LAB — CBC
HCT: 25.8 % — ABNORMAL LOW (ref 39.0–52.0)
HEMATOCRIT: 25.7 % — AB (ref 39.0–52.0)
HEMATOCRIT: 26.1 % — AB (ref 39.0–52.0)
HEMOGLOBIN: 8.8 g/dL — AB (ref 13.0–17.0)
Hemoglobin: 8.7 g/dL — ABNORMAL LOW (ref 13.0–17.0)
Hemoglobin: 8.8 g/dL — ABNORMAL LOW (ref 13.0–17.0)
MCH: 29.4 pg (ref 26.0–34.0)
MCH: 29.4 pg (ref 26.0–34.0)
MCH: 29.5 pg (ref 26.0–34.0)
MCHC: 33.7 g/dL (ref 30.0–36.0)
MCHC: 33.7 g/dL (ref 30.0–36.0)
MCHC: 34.2 g/dL (ref 30.0–36.0)
MCV: 86 fL (ref 78.0–100.0)
MCV: 87.3 fL (ref 78.0–100.0)
MCV: 87.5 fL (ref 78.0–100.0)
Platelets: 65 10*3/uL — ABNORMAL LOW (ref 150–400)
Platelets: 67 10*3/uL — ABNORMAL LOW (ref 150–400)
Platelets: 68 10*3/uL — ABNORMAL LOW (ref 150–400)
RBC: 2.95 MIL/uL — ABNORMAL LOW (ref 4.22–5.81)
RBC: 2.99 MIL/uL — ABNORMAL LOW (ref 4.22–5.81)
RBC: 2.99 MIL/uL — AB (ref 4.22–5.81)
RDW: 15.5 % (ref 11.5–15.5)
RDW: 15.6 % — AB (ref 11.5–15.5)
RDW: 15.7 % — AB (ref 11.5–15.5)
WBC: 10.6 10*3/uL — ABNORMAL HIGH (ref 4.0–10.5)
WBC: 11.3 10*3/uL — ABNORMAL HIGH (ref 4.0–10.5)
WBC: 9.3 10*3/uL (ref 4.0–10.5)

## 2013-08-24 LAB — URINE MICROSCOPIC-ADD ON

## 2013-08-24 LAB — GLUCOSE, CAPILLARY
GLUCOSE-CAPILLARY: 190 mg/dL — AB (ref 70–99)
Glucose-Capillary: 210 mg/dL — ABNORMAL HIGH (ref 70–99)

## 2013-08-24 LAB — CREATININE, URINE, RANDOM: Creatinine, Urine: 76.42 mg/dL

## 2013-08-24 LAB — VITAMIN B12: VITAMIN B 12: 1282 pg/mL — AB (ref 211–911)

## 2013-08-24 LAB — TYPE AND SCREEN
ABO/RH(D): A POS
Antibody Screen: NEGATIVE

## 2013-08-24 LAB — FERRITIN: Ferritin: 2119 ng/mL — ABNORMAL HIGH (ref 22–322)

## 2013-08-24 LAB — FOLATE: FOLATE: 12.4 ng/mL

## 2013-08-24 LAB — POTASSIUM: Potassium: 3.2 mEq/L — ABNORMAL LOW (ref 3.7–5.3)

## 2013-08-24 LAB — RETICULOCYTES
RBC.: 3.06 MIL/uL — ABNORMAL LOW (ref 4.22–5.81)
Retic Count, Absolute: 24.5 10*3/uL (ref 19.0–186.0)
Retic Ct Pct: 0.8 % (ref 0.4–3.1)

## 2013-08-24 LAB — CLOSTRIDIUM DIFFICILE BY PCR: Toxigenic C. Difficile by PCR: POSITIVE — AB

## 2013-08-24 LAB — SODIUM, URINE, RANDOM: Sodium, Ur: 53 mEq/L

## 2013-08-24 LAB — MAGNESIUM: Magnesium: 2.5 mg/dL (ref 1.5–2.5)

## 2013-08-24 MED ORDER — POTASSIUM CHLORIDE CRYS ER 20 MEQ PO TBCR
40.0000 meq | EXTENDED_RELEASE_TABLET | Freq: Once | ORAL | Status: AC
  Administered 2013-08-24: 40 meq via ORAL
  Filled 2013-08-24: qty 2

## 2013-08-24 MED ORDER — POTASSIUM CHLORIDE 10 MEQ/100ML IV SOLN
10.0000 meq | INTRAVENOUS | Status: AC
  Administered 2013-08-24 (×4): 10 meq via INTRAVENOUS
  Filled 2013-08-24: qty 100

## 2013-08-24 MED ORDER — METRONIDAZOLE 500 MG PO TABS
500.0000 mg | ORAL_TABLET | Freq: Three times a day (TID) | ORAL | Status: DC
  Filled 2013-08-24 (×4): qty 1

## 2013-08-24 MED ORDER — STERILE WATER FOR INJECTION IV SOLN
INTRAVENOUS | Status: DC
  Administered 2013-08-24 – 2013-09-02 (×9): via INTRAVENOUS
  Filled 2013-08-24 (×16): qty 850

## 2013-08-24 MED ORDER — VANCOMYCIN 50 MG/ML ORAL SOLUTION
125.0000 mg | Freq: Four times a day (QID) | ORAL | Status: DC
  Administered 2013-08-24 – 2013-08-29 (×20): 125 mg via ORAL
  Filled 2013-08-24 (×25): qty 2.5

## 2013-08-24 MED ORDER — STERILE WATER FOR INJECTION IV SOLN: 3.0000 | INTRAVENOUS | Status: DC

## 2013-08-24 MED ORDER — BOOST / RESOURCE BREEZE PO LIQD
1.0000 | Freq: Three times a day (TID) | ORAL | Status: DC
  Administered 2013-08-24 – 2013-08-29 (×3): 1 via ORAL

## 2013-08-24 MED ORDER — POTASSIUM CHLORIDE 10 MEQ/100ML IV SOLN
10.0000 meq | INTRAVENOUS | Status: AC
  Administered 2013-08-24 (×4): 10 meq via INTRAVENOUS
  Filled 2013-08-24 (×3): qty 100

## 2013-08-24 MED ORDER — METRONIDAZOLE IN NACL 5-0.79 MG/ML-% IV SOLN
500.0000 mg | Freq: Three times a day (TID) | INTRAVENOUS | Status: DC
  Administered 2013-08-24 – 2013-08-29 (×14): 500 mg via INTRAVENOUS
  Filled 2013-08-24 (×17): qty 100

## 2013-08-24 NOTE — Progress Notes (Signed)
INITIAL NUTRITION ASSESSMENT  DOCUMENTATION CODES Per approved criteria  -Severe malnutrition in the context of chronic illness   INTERVENTION: Resource Breeze PO TID, each supplement provides 250 kcal and 9 grams of protein  NUTRITION DIAGNOSIS: Malnutrition related to inadequate oral intake as evidenced by severe depletion of muscle mass with intake </= 75% of estimated energy requirement.   Goal: Intake to meet >90% of estimated nutrition needs.  Monitor:  Diet advancement, PO intake, labs, weight trend.  Reason for Assessment: MST; Low [place]  73 y.o. male  Admitting Dx: Acute respiratory failure; SOB and diarrhea  ASSESSMENT: 73 y.o. male with history of multiple myeloma and chemotherapy every Tuesday last one was yesterday, chronic kidney disease, anemia, pacemaker placement has been experiencing diarrhea for last one week. Patient has been having at least 4-5 episodes of watery diarrhea which was dark in color. Denies any abdominal pain or nausea vomiting. In the ER at [facility] patient's chest x-ray showed vascular congestion.  Patient reports that "I can eat you out of house and home." Wife reports that patient has lost ~10 lb over the past week or two due to fluids. Patient has been eating two to three meals per day. From dietary recall at home, suspect intake has been meeting </= 50% of estimated energy requirement. Recent weight loss likely related to fluid loss.   Nutrition Focused Physical Exam:  Subcutaneous Fat:  Orbital Region: WNL Upper Arm Region: mild depletion Thoracic and Lumbar Region: moderate depletion  Muscle:  Temple Region: mild depletion Clavicle Bone Region: moderate depletion Clavicle and Acromion Bone Region: moderate depletion Scapular Bone Region: moderate depletion Dorsal Hand: moderate depletion Patellar Region: severe depletion Anterior Thigh Region: severe depletion Posterior Calf Region: severe depletion  Edema: mild in  LE  Pt meets criteria for severe MALNUTRITION in the context of chronic illness as evidenced by severe depletion of muscle mass with intake </= 75% of estimated energy requirement for >/= 1 month.   Height: Ht Readings from Last 1 Encounters:  08/23/13 5' 5"  (1.651 m)    Weight: Wt Readings from Last 1 Encounters:  08/24/13 152 lb 9.6 oz (69.219 kg)    Ideal Body Weight: 61.8 kg  % Ideal Body Weight: 112%  Wt Readings from Last 10 Encounters:  08/24/13 152 lb 9.6 oz (69.219 kg)  07/10/13 178 lb (80.74 kg)  07/10/13 178 lb (80.74 kg)  07/06/13 178 lb 4.8 oz (80.876 kg)  06/10/13 143 lb 8.3 oz (65.1 kg)  03/21/13 135 lb 9.6 oz (61.508 kg)    Usual Body Weight: 178 lb  % Usual Body Weight: 85%  BMI:  Body mass index is 25.39 kg/(m^2).  Estimated Nutritional Needs: Kcal: 1800-2000 Protein: 90-100 gm Fluid: 1.8-2 L  Skin: stage 2 pressure ulcer to sacrum  Diet Order: Clear Liquid  EDUCATION NEEDS: -Education not appropriate at this time   Intake/Output Summary (Last 24 hours) at 08/24/13 1044 Last data filed at 08/24/13 0823  Gross per 24 hour  Intake    100 ml  Output    201 ml  Net   -101 ml    Last BM: 6/10   Labs:   Recent Labs Lab 08/23/13 2000 08/24/13 0209  NA 139 142  K 3.2* 2.4*  CL 98 100  CO2 17* 16*  BUN 107* 113*  CREATININE 5.52* 5.58*  CALCIUM 8.7 8.8  MG  --  2.5  PHOS 7.3*  --   GLUCOSE 185* 207*    CBG (last  3)   Recent Labs  08/24/13 0811  GLUCAP 210*    Scheduled Meds: . acyclovir  400 mg Oral Daily  . amiodarone  200 mg Oral Daily  . [START ON 08/29/2013] dexamethasone  40 mg Oral Q7 days  . ferrous sulfate  325 mg Oral Q breakfast  . insulin aspart  0-9 Units Subcutaneous TID WC  . megestrol  800 mg Oral Daily  . metronidazole  500 mg Intravenous Q8H  . potassium chloride  40 mEq Oral Once  . sodium chloride  3 mL Intravenous Q12H  . tamsulosin  0.4 mg Oral QPC breakfast  . vancomycin  125 mg Oral QID     Continuous Infusions:   Past Medical History  Diagnosis Date  . Renal disorder   . Pacemaker   . Shortness of breath     "when I take one of my chemo pills" (03/17/2013)  . Protein calorie malnutrition   . Multiple myeloma   . Skin cancer of face     "once; left side of my face" (03/17/2013)  . Pneumonia 06/09/2013  . Diabetes mellitus without complication     no meds  . Arthritis     "little in my right shoulder" (03/17/2013)  . History of blood transfusion   . Daily headache     denies  . Cataract     Past Surgical History  Procedure Laterality Date  . Appendectomy    . Kidney surgery Right 2000's    "cut out ~ 1/2"   . Insert / replace / remove pacemaker    . Bascilic vein transposition Right 07/10/2013    Procedure: BASILIC VEIN TRANSPOSITION- FIRST STAGE ;  Surgeon: Conrad Garrett, MD;  Location: [place];  Service: Vascular;  Laterality: Right;    Molli Barrows, [place], [place], [place] Pager [phone] After Hours Pager [phone]

## 2013-08-24 NOTE — Progress Notes (Signed)
Patient ID: Scott [name]  male  IOX:[id number]    DOB: 1940-04-30    DOA: 08/23/2013  PCP: Bufford Buttner, MD  Assessment/Plan: Principal Problem:   Acute respiratory failure: Likely due to vascular congestion, - Improving, O2 sats 100% on 2 L with nasal cannula - Follow 2-D echocardiogram, I/O's   Active Problems: Acute diarrhea with C. difficile colitis - Stool cultures are pending however C. difficile came back as positive - Placed on oral vancomycin, will also place on IV Flagyl for now - C. difficile precautions    Multiple myeloma: Finished his last chemotherapy on 6/9    Renal failure (ARF), acute on chronic - Baseline creatinine 3.5, his nephrologist is, Dr. Florene Glen, consulted - Creatinine currently 5.5, we'll follow further recommendations, he has a right arm fistula, vascular surgery (Dr. Bridgett Larsson)     Hypokalemia - Repeat BMET today at 44, received IV replacement, will also place on 40 mEq oral K-Dur - Magnesium normal    Anemia - Likely due to anemia of chronic kidney disease, obtain anemia panel  DVT Prophylaxis:  Code Status: Full code  Family Communication: Discussed with patient's wife at the bedside  Disposition: Will monitor in step-down unit today  Consultants:  Nephrology  Procedures:  None  Antibiotics:  Oral vancomycin  IV metronidazole  Subjective: Patient seen and examined, still having diarrhea, no chest pain or shortness of breath, fevers or chills  Objective: Weight change:   Intake/Output Summary (Last 24 hours) at 08/24/13 0931 Last data filed at 08/24/13 0823  Gross per 24 hour  Intake    100 ml  Output    201 ml  Net   -101 ml   Blood pressure 131/61, pulse 60, temperature 98 F (36.7 C), temperature source Oral, resp. rate 16, height 5' 5"  (1.651 m), weight 69.219 kg (152 lb 9.6 oz), SpO2 100.00%.  Physical Exam: General: Alert and awake, oriented x3, not in any acute distress. CVS: S1-S2 clear, no murmur rubs or  gallops Chest: clear to auscultation bilaterally, no wheezing, rales or rhonchi Abdomen: soft nontender, nondistended, normal bowel sounds  Extremities: no cyanosis, clubbing, 1+ edema noted bilaterally Neuro: Cranial nerves II-XII intact, no focal neurological deficits  Lab Results: Basic Metabolic Panel:  Recent Labs Lab 08/23/13 2000 08/24/13 0209  NA 139 142  K 3.2* 2.4*  CL 98 100  CO2 17* 16*  GLUCOSE 185* 207*  BUN 107* 113*  CREATININE 5.52* 5.58*  CALCIUM 8.7 8.8  MG  --  2.5  PHOS 7.3*  --    Liver Function Tests:  Recent Labs Lab 08/23/13 2000  AST 14  ALT 8  ALKPHOS 54  BILITOT 0.3  PROT 5.9*  ALBUMIN 2.6*   No results found for this basename: LIPASE, AMYLASE,  in the last 168 hours No results found for this basename: AMMONIA,  in the last 168 hours CBC:  Recent Labs Lab 08/23/13 2000  08/24/13 0209 08/24/13 0548  WBC 12.0*  < > 11.3* 9.3  NEUTROABS 11.2*  --   --   --   HGB 8.8*  < > 8.7* 8.8*  HCT 26.2*  < > 25.8* 25.7*  MCV 87.0  < > 87.5 86.0  PLT 71*  < > 68* 65*  < > = values in this interval not displayed. Cardiac Enzymes: No results found for this basename: CKTOTAL, CKMB, CKMBINDEX, TROPONINI,  in the last 168 hours BNP: No components found with this basename: POCBNP,  CBG:  Recent Labs Lab  08/24/13 0811  GLUCAP 210*     Micro Results: Recent Results (from the past 240 hour(s))  MRSA PCR SCREENING     Status: None   Collection Time    08/23/13  6:41 PM      Result Value Ref Range Status   MRSA by PCR NEGATIVE  NEGATIVE Final   Comment:            The GeneXpert MRSA Assay (FDA     approved for NASAL specimens     only), is one component of a     comprehensive MRSA colonization     surveillance program. It is not     intended to diagnose MRSA     infection nor to guide or     monitor treatment for     MRSA infections.  CLOSTRIDIUM DIFFICILE BY PCR     Status: Abnormal   Collection Time    08/23/13 10:36 PM       Result Value Ref Range Status   C difficile by pcr POSITIVE (*) NEGATIVE Final   Comment: CRITICAL RESULT CALLED TO, READ BACK BY AND VERIFIED WITH:     Larose Kells RN 08/24/13 0740 COSTELLO B    Studies/Results: Dg Chest Port 1 View  08/24/2013   CLINICAL DATA:  Shortness of breath.  Infiltrates.  EXAM: PORTABLE CHEST - 1 VIEW  COMPARISON:  08/23/2013  FINDINGS: Two lead cardiac pacemaker without change in position. Cardiac enlargement with mild vascular congestion and interstitial edema. There seems to be mild progression since prior study. Atelectasis in the left lung base. No pneumothorax.  IMPRESSION: Cardiac enlargement with mild vascular congestion and interstitial edema, demonstrating some progression since prior study. Probable atelectasis in the left lung base.   Electronically Signed   By: Lucienne Capers M.D.   On: 08/24/2013 03:05   Dg Abd Portable 1v  08/24/2013   CLINICAL DATA:  Abdominal pain.  Blood in stools.  EXAM: PORTABLE ABDOMEN - 1 VIEW  COMPARISON:  CT abdomen and pelvis 12/1912.  FINDINGS: Examination is technically limited due to field of view. Portions of the abdomen and pelvis are not included. The bowel gas pattern is normal. No radio-opaque calculi or other significant radiographic abnormality are seen.  IMPRESSION: No evidence of bowel obstruction.   Electronically Signed   By: Lucienne Capers M.D.   On: 08/24/2013 03:06    Medications: Scheduled Meds: . acyclovir  400 mg Oral Daily  . amiodarone  200 mg Oral Daily  . [START ON 08/29/2013] dexamethasone  40 mg Oral Q7 days  . ferrous sulfate  325 mg Oral Q breakfast  . insulin aspart  0-9 Units Subcutaneous TID WC  . megestrol  800 mg Oral Daily  . potassium chloride  10 mEq Intravenous Q1 Hr x 6  . sodium chloride  3 mL Intravenous Q12H  . tamsulosin  0.4 mg Oral QPC breakfast  . vancomycin  125 mg Oral QID      LOS: 1 day   Gaylynn Seiple M.D. [facility] 08/24/2013, 9:31 AM Pager: [phone]  If  7PM-7AM, please contact night-coverage [email or web address] Password [email or web address]  **Disclaimer: This note was dictated with voice recognition software. Similar sounding words can inadvertently be transcribed and this note may contain transcription errors which may not have been corrected upon publication of note.**

## 2013-08-24 NOTE — Progress Notes (Signed)
VASCULAR LAB PRELIMINARY  PRELIMINARY  PRELIMINARY  PRELIMINARY  Bilateral lower extremity venous duplex  completed.    Preliminary report:  Bilateral:  No evidence of DVT, superficial thrombosis, or Baker's Cyst.    Jud Fanguy, RVT 08/24/2013, 6:39 PM

## 2013-08-24 NOTE — Progress Notes (Signed)
Utilization review completed. Megham Dwyer, RN, BSN. 

## 2013-08-24 NOTE — Consult Note (Signed)
Reason for Consult:AKI/CKD Referring Physician: Tana Coast, MD  Scott [name] is an 73 y.o. male.  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  He was subsequently found to be C. Diff + so is being treated for that.  We were asked to help evaluate and manage his AKI/CKD in setting of volume depletion, hypotension, and C. Diff colitis in pt with advanced CKD and multiple myeloma.  The trend in Scr is seen below.  Trend in Creatinine: Creatinine, Ser  Date/Time Value Ref Range Status  08/24/2013  2:09 AM 5.58* 0.50 - 1.35 mg/dL Final  08/23/2013  8:00 PM 5.52* 0.50 - 1.35 mg/dL Final  06/09/2013  4:45 AM 3.53* 0.50 - 1.35 mg/dL Final  06/08/2013  9:45 PM 3.49* 0.50 - 1.35 mg/dL Final  03/22/2013  9:20 AM 3.69* 0.50 - 1.35 mg/dL Final  03/21/2013  5:12 AM 3.91* 0.50 - 1.35 mg/dL Final  03/20/2013  9:00 AM 4.41* 0.50 - 1.35 mg/dL Final  03/19/2013  6:35 AM 4.76* 0.50 - 1.35 mg/dL Final  03/18/2013  5:00 AM 5.03* 0.50 - 1.35 mg/dL Final  03/17/2013 11:05 PM 4.98* 0.50 - 1.35 mg/dL Final  03/17/2013  3:00 PM 5.05* 0.50 - 1.35 mg/dL Final    PMH:   Past Medical History  Diagnosis Date  . Renal disorder   . Pacemaker   . Shortness of breath     "when I take one of my chemo pills" (03/17/2013)  . Protein calorie malnutrition   . Multiple myeloma   . Skin cancer of face     "once; left side of my face" (03/17/2013)  . Pneumonia 06/09/2013  . Diabetes mellitus without complication     no meds  . Arthritis     "little in my right shoulder" (03/17/2013)  . History of blood transfusion   . Daily headache     denies  . Cataract     PSH:   Past Surgical History  Procedure Laterality Date  . Appendectomy    . Kidney surgery Right 2000's    "cut out  ~ 1/2"   . Insert / replace / remove pacemaker    . Bascilic vein transposition Right 07/10/2013    Procedure: BASILIC VEIN TRANSPOSITION- FIRST STAGE ;  Surgeon: Conrad Pinesburg, MD;  Location: [place];  Service: Vascular;  Laterality: Right;    Allergies: No Known Allergies  Medications:   Prior to Admission medications   Medication Sig Start Date End Date Taking? Authorizing Provider  ACCU-CHEK AVIVA PLUS test strip  05/31/13  Yes Historical Provider, MD  ACCU-CHEK SOFTCLIX LANCETS lancets  05/31/13  Yes Historical Provider, MD  acyclovir (ZOVIRAX) 400 MG tablet Take 400 mg by mouth daily.   Yes Historical Provider, MD  amiodarone (PACERONE) 200 MG tablet Take 200 mg by mouth daily.   Yes Historical Provider, MD  dexamethasone (DECADRON) 4 MG tablet Take 40 mg by mouth every 7 (seven) days. Every Tuesday.   Yes Historical Provider, MD  ferrous sulfate 325 (65 FE) MG tablet Take 325 mg by mouth daily with breakfast.   Yes Historical Provider, MD  furosemide (LASIX) 80 MG tablet Take 180-240 mg by mouth 2 (two) times daily. Take 3 tablets by mouth  in AM and 2 tablets by mouth in PM   Yes Historical Provider, MD  megestrol (MEGACE) 40 MG/ML suspension Take 800 mg by mouth daily.   Yes Historical Provider, MD  tamsulosin (FLOMAX) 0.4 MG CAPS capsule Take 0.4 mg by mouth daily after breakfast.   Yes Historical Provider, MD    Inpatient medications: . acyclovir  400 mg Oral Daily  . amiodarone  200 mg Oral Daily  . [START ON 08/29/2013] dexamethasone  40 mg Oral Q7 days  . feeding supplement (RESOURCE BREEZE)  1 Container Oral TID BM  . ferrous sulfate  325 mg Oral Q breakfast  . insulin aspart  0-9 Units Subcutaneous TID WC  . megestrol  800 mg Oral Daily  . metronidazole  500 mg Intravenous Q8H  . sodium chloride  3 mL Intravenous Q12H  . tamsulosin  0.4 mg Oral QPC breakfast  . vancomycin  125 mg Oral QID    Discontinued Meds:   Medications Discontinued During This Encounter  Medication  Reason  . oxyCODONE-acetaminophen (PERCOCET/ROXICET) 5-325 MG per tablet Discontinued by provider  . furosemide (LASIX) tablet 160 mg   . pantoprazole (PROTONIX) injection 40 mg   . metroNIDAZOLE (FLAGYL) tablet 500 mg Change in therapy    Social History:  reports that he has been smoking Cigarettes.  He has a 15 pack-year smoking history. He has never used smokeless tobacco. He reports that he does not drink alcohol or use illicit drugs.  Family History:   Family History  Problem Relation Age of Onset  . Diabetes Mellitus II Father     Pertinent items are noted in HPI. Weight change:   Intake/Output Summary (Last 24 hours) at 08/24/13 1411 Last data filed at 08/24/13 0823  Gross per 24 hour  Intake    100 ml  Output    201 ml  Net   -101 ml   BP 131/61  Pulse 60  Temp(Src) 98 F (36.7 C) (Oral)  Resp 16  Ht 5' 5" (1.651 m)  Wt 69.219 kg (152 lb 9.6 oz)  BMI 25.39 kg/m2  SpO2 100% Filed Vitals:   08/23/13 2334 08/24/13 0452 08/24/13 0739 08/24/13 0809  BP: 126/59 128/60  131/61  Pulse: 62 59 62 60  Temp: 98 F (36.7 C) 97.9 F (36.6 C) 98.9 F (37.2 C) 98 F (36.7 C)  TempSrc: Oral Oral Oral Oral  Resp: _0 Height:      Weight:  69.219 kg (152 lb 9.6 oz)    SpO2: 100% 97% 96% 100%     General appearance: fatigued, no distress, pale and slowed mentation Head: Normocephalic, without obvious abnormality, atraumatic Eyes: negative findings: lids and lashes normal, conjunctivae and sclerae normal and corneas clear Neck: no adenopathy, no carotid bruit, no JVD, supple, symmetrical, trachea midline and thyroid not enlarged, symmetric, no tenderness/mass/nodules Resp: CTA with decreased BS at bases Cardio: regular rate and rhythm, S1, S2 normal, no murmur, click, rub or gallop GI: soft, non-tender; bowel sounds normal; no masses,  no organomegaly Extremities: edema 2+ edema of lower ext, RUE AVF +T/B but small calliber and tortuous  Labs: Basic Metabolic  Panel:  Recent Labs Lab 08/23/13 2000 08/24/13 0209  NA 139 142  K 3.2* 2.4*  CL 98 100  CO2 17* 16*  GLUCOSE 185* 207*  BUN 107* 113*  CREATININE 5.52* 5.58*  ALBUMIN 2.6*  --   CALCIUM 8.7 8.8  PHOS 7.3*  --    Liver Function  Tests:  Recent Labs Lab 08/23/13 2000  AST 14  ALT 8  ALKPHOS 54  BILITOT 0.3  PROT 5.9*  ALBUMIN 2.6*   No results found for this basename: LIPASE, AMYLASE,  in the last 168 hours No results found for this basename: AMMONIA,  in the last 168 hours CBC:  Recent Labs Lab 08/23/13 2000 08/23/13 2356 08/24/13 0209 08/24/13 0548  WBC 12.0* 10.6* 11.3* 9.3  NEUTROABS 11.2*  --   --   --   HGB 8.8* 8.8* 8.7* 8.8*  HCT 26.2* 26.1* 25.8* 25.7*  MCV 87.0 87.3 87.5 86.0  PLT 71* 67* 68* 65*   PT/INR: _0 (inr:5) Cardiac Enzymes: )No results found for this basename: CKTOTAL, CKMB, CKMBINDEX, TROPONINI,  in the last 168 hours CBG:  Recent Labs Lab 08/24/13 0811 08/24/13 1247  GLUCAP 210* 190*    Iron Studies: No results found for this basename: IRON, TIBC, TRANSFERRIN, FERRITIN,  in the last 168 hours  Xrays/Other Studies: Dg Chest Port 1 View  08/24/2013   CLINICAL DATA:  Shortness of breath.  Infiltrates.  EXAM: PORTABLE CHEST - 1 VIEW  COMPARISON:  08/23/2013  FINDINGS: Two lead cardiac pacemaker without change in position. Cardiac enlargement with mild vascular congestion and interstitial edema. There seems to be mild progression since prior study. Atelectasis in the left lung base. No pneumothorax.  IMPRESSION: Cardiac enlargement with mild vascular congestion and interstitial edema, demonstrating some progression since prior study. Probable atelectasis in the left lung base.   Electronically Signed   By: Lucienne Capers M.D.   On: 08/24/2013 03:05   Dg Abd Portable 1v  08/24/2013   CLINICAL DATA:  Abdominal pain.  Blood in stools.  EXAM: PORTABLE ABDOMEN - 1 VIEW  COMPARISON:  CT abdomen and pelvis 12/1912.  FINDINGS:  Examination is technically limited due to field of view. Portions of the abdomen and pelvis are not included. The bowel gas pattern is normal. No radio-opaque calculi or other significant radiographic abnormality are seen.  IMPRESSION: No evidence of bowel obstruction.   Electronically Signed   By: Lucienne Capers M.D.   On: 08/24/2013 03:06     Assessment/Plan: 1.  AKI/CKD- likely related to ischemic ATN in setting of volume depletion and hypotension.  Cont with supportive care and cont to follow daily Scr.  No indication for dialysis at this time. 2. Metabolic acidosis- will add bicarb and follow 3. C. Diff colitis- on po vanco 4. SIRS- had some hypotension on admission but appears to be improving. 5. Multiple myeloma- recently finished a cycle of chemo.  Would hold off on next cycle given active infection 6. Hypokalemia- will check Mg level and cont to replace 7. Anemia- s/p recent cycle of chemo.   8. Thrombocytopenia- cont to follow 9. Vascular access- does not appear to be mature/viable.  Will likely need revision or new creation.   COLADONATO,JOSEPH A 08/24/2013, 2:11 PM

## 2013-08-24 NOTE — Progress Notes (Signed)
2D Echocardiogram Complete.  08/24/2013   Scott [name], [place]

## 2013-08-25 ENCOUNTER — Encounter: Payer: Commercial Managed Care - HMO | Admitting: Vascular Surgery

## 2013-08-25 ENCOUNTER — Other Ambulatory Visit (HOSPITAL_COMMUNITY): Payer: Commercial Managed Care - HMO

## 2013-08-25 LAB — GLUCOSE, CAPILLARY
GLUCOSE-CAPILLARY: 94 mg/dL (ref 70–99)
GLUCOSE-CAPILLARY: 104 mg/dL — AB (ref 70–99)
Glucose-Capillary: 103 mg/dL — ABNORMAL HIGH (ref 70–99)
Glucose-Capillary: 108 mg/dL — ABNORMAL HIGH (ref 70–99)
Glucose-Capillary: 91 mg/dL (ref 70–99)

## 2013-08-25 LAB — BASIC METABOLIC PANEL
BUN: 112 mg/dL — ABNORMAL HIGH (ref 6–23)
BUN: 111 mg/dL — AB (ref 6–23)
CO2: 16 mEq/L — ABNORMAL LOW (ref 19–32)
CO2: 18 mEq/L — ABNORMAL LOW (ref 19–32)
Calcium: 8.6 mg/dL (ref 8.4–10.5)
Calcium: 8.4 mg/dL (ref 8.4–10.5)
Chloride: 104 mEq/L (ref 96–112)
Chloride: 106 mEq/L (ref 96–112)
Creatinine, Ser: 5.59 mg/dL — ABNORMAL HIGH (ref 0.50–1.35)
Creatinine, Ser: 5.43 mg/dL — ABNORMAL HIGH (ref 0.50–1.35)
GFR calc Af Amer: 11 mL/min — ABNORMAL LOW (ref >90)
GFR calc Af Amer: 11 mL/min — ABNORMAL LOW (ref >90)
GFR calc non Af Amer: 9 mL/min — ABNORMAL LOW (ref >90)
GFR calc non Af Amer: 10 mL/min — ABNORMAL LOW (ref >90)
GLUCOSE: 98 mg/dL (ref 70–99)
Glucose, Bld: 101 mg/dL — ABNORMAL HIGH (ref 70–99)
POTASSIUM: 3.5 meq/L — AB (ref 3.7–5.3)
Potassium: 2.7 mEq/L — CL (ref 3.7–5.3)
Sodium: 142 mEq/L (ref 137–147)
Sodium: 144 mEq/L (ref 137–147)

## 2013-08-25 LAB — CBC
HEMATOCRIT: 26.9 % — AB (ref 39.0–52.0)
Hemoglobin: 9 g/dL — ABNORMAL LOW (ref 13.0–17.0)
MCH: 29 pg (ref 26.0–34.0)
MCHC: 33.5 g/dL (ref 30.0–36.0)
MCV: 86.8 fL (ref 78.0–100.0)
PLATELETS: 67 10*3/uL — AB (ref 150–400)
RBC: 3.1 MIL/uL — AB (ref 4.22–5.81)
RDW: 15.7 % — AB (ref 11.5–15.5)
WBC: 8.9 10*3/uL (ref 4.0–10.5)

## 2013-08-25 MED ORDER — POTASSIUM CHLORIDE CRYS ER 20 MEQ PO TBCR
40.0000 meq | EXTENDED_RELEASE_TABLET | Freq: Once | ORAL | Status: AC
  Administered 2013-08-25: 40 meq via ORAL
  Filled 2013-08-25: qty 2

## 2013-08-25 MED ORDER — POTASSIUM CHLORIDE 10 MEQ/100ML IV SOLN
10.0000 meq | INTRAVENOUS | Status: AC
  Administered 2013-08-25 (×4): 10 meq via INTRAVENOUS
  Filled 2013-08-25 (×4): qty 100

## 2013-08-25 NOTE — Progress Notes (Signed)
Assessment/Plan:  1. AKI/CKD- likely related to ischemic ATN in setting of volume depletion and hypotension. Cont with supportive care and cont to follow daily Scr. No indication for dialysis at this time. 2. Metabolic acidosis- on bicarb and follow 3. C. Diff colitis- on po vanco 4. Multiple myeloma- recently finished a cycle of chemo. Would hold off on next cycle given active infection  Subjective: Interval History: none.  Objective: Vital signs in last 24 hours: Temp:  [97.8 F (36.6 C)-98.9 F (37.2 C)] 98.2 F (36.8 C) (06/12 1503) Pulse Rate:  [59-69] 62 (06/12 1503) Resp:  [13-22] 17 (06/12 1503) BP: (110-132)/(54-58) 111/57 mmHg (06/12 1503) SpO2:  [94 %-100 %] 97 % (06/12 1503) Weight change:   Intake/Output from previous day: 06/11 0701 - 06/12 0700 In: 1058.3 [I.V.:658.3; IV Piggyback:400] Out: 201 [Urine:200; Stool:1] Intake/Output this shift: Total I/O In: 950 [I.V.:550; IV Piggyback:400] Out: 1 [Stool:1]  Alert and approrpiate GI: soft, non-tender; bowel sounds normal; no masses,  no organomegaly Extremities: edema 1+ AVF rue not mature  Lab Results:  Recent Labs  08/24/13 0548 08/25/13 0245  WBC 9.3 8.9  HGB 8.8* 9.0*  HCT 25.7* 26.9*  PLT 65* 67*   BMET:  Recent Labs  08/25/13 0245 08/25/13 1615  NA 142 144  K 2.7* 3.5*  CL 104 106  CO2 16* 18*  GLUCOSE 101* 98  BUN 112* 111*  CREATININE 5.59* 5.43*  CALCIUM 8.6 8.4   No results found for this basename: PTH,  in the last 72 hours Iron Studies:  Recent Labs  08/24/13 1040  IRON 31*  TIBC 135*  FERRITIN 2119*   Studies/Results: Dg Chest Port 1 View  08/24/2013   CLINICAL DATA:  Shortness of breath.  Infiltrates.  EXAM: PORTABLE CHEST - 1 VIEW  COMPARISON:  08/23/2013  FINDINGS: Two lead cardiac pacemaker without change in position. Cardiac enlargement with mild vascular congestion and interstitial edema. There seems to be mild progression since prior study. Atelectasis in the left  lung base. No pneumothorax.  IMPRESSION: Cardiac enlargement with mild vascular congestion and interstitial edema, demonstrating some progression since prior study. Probable atelectasis in the left lung base.   Electronically Signed   By: Lucienne Capers M.D.   On: 08/24/2013 03:05   Dg Abd Portable 1v  08/24/2013   CLINICAL DATA:  Abdominal pain.  Blood in stools.  EXAM: PORTABLE ABDOMEN - 1 VIEW  COMPARISON:  CT abdomen and pelvis 12/1912.  FINDINGS: Examination is technically limited due to field of view. Portions of the abdomen and pelvis are not included. The bowel gas pattern is normal. No radio-opaque calculi or other significant radiographic abnormality are seen.  IMPRESSION: No evidence of bowel obstruction.   Electronically Signed   By: Lucienne Capers M.D.   On: 08/24/2013 03:06   Scheduled: . acyclovir  400 mg Oral Daily  . amiodarone  200 mg Oral Daily  . [START ON 08/29/2013] dexamethasone  40 mg Oral Q7 days  . feeding supplement (RESOURCE BREEZE)  1 Container Oral TID BM  . ferrous sulfate  325 mg Oral Q breakfast  . insulin aspart  0-9 Units Subcutaneous TID WC  . megestrol  800 mg Oral Daily  . metronidazole  500 mg Intravenous Q8H  . sodium chloride  3 mL Intravenous Q12H  . tamsulosin  0.4 mg Oral QPC breakfast  . vancomycin  125 mg Oral QID     LOS: 2 days   Darien Mignogna C 08/25/2013,6:30 PM

## 2013-08-25 NOTE — Progress Notes (Signed)
[place] TEAM 1 - Stepdown/ICU TEAM Progress Note  Scott [name] TKZ:[id number] DOB: 01-Oct-1940 DOA: 08/23/2013 PCP: Bufford Buttner, MD  Admit HPI / Brief Narrative: 73 y.o. male with history of multiple myeloma receiving chemotherapy every Tuesday (last tx day bfr admit), chronic kidney disease, anemia, and s/p pacemaker placement who had been experiencing diarrhea for one week. Patient had been having at least 4-5 episodes of watery diarrhea which was dark in color. Denied abdominal pain or nausea/vomiting. Patient became short of breath and also noticed swelling in his lower extremities.  In the ER at [facility] the patient's chest x-ray showed vascular congestion. Patient was found to be hypotensive and creatinine was found to be increased from baseline (as per our records patient's creatinine was around 3.5 last March). Patient was transferred to [facility] due to his worsening renal function. Patient has an AV fistula in the right arm and was originally to followup with vascular surgeon next week for patency.   HPI/Subjective: Pt is in good spirits.  Diarrhea continues.  No cp or sob.  Some nausea but no vomiting.    Assessment/Plan:   C diff colitis Cont current tx and follow   Acute renal failure on chronic kidney disease Baseline crt ~3.5 - likely related to ischemic ATN in setting of volume depletion and hypotension - care as per Nephrology  Metabolic acidosis Due to renal failure + profuse diarrhea - on bicarb IV - follow   Multiple myeloma   Multifactorial chronic anemia Follow Hgb trend - no evidence of acute blood loss  Severe Hypokalemia Due to GI losses + poor intake, and now IV bicarb - cont to replete and follow  Thrombocytopenia Follow   DM CBG well controlled at this time   Code Status: FULL Family Communication: spoke w/ pt and wife at bedside  Disposition Plan: SDU  Consultants: Nephrology   Procedures: 6/11 - B LE venous dopplers - no  evidence of DVT, superficial thrombosis, or Baker's Cyst 6/11 - TTE - pending   Antibiotics: Acyclovir 6/11 >> Flagyl 6/11 >> Vanc 6/11 >>  DVT prophylaxis: SCDs  Objective: Blood pressure 111/57, pulse 62, temperature 98.9 F (37.2 C), temperature source Oral, resp. rate 17, height 5' 5"  (1.651 m), weight 69.219 kg (152 lb 9.6 oz), SpO2 97.00%.  Intake/Output Summary (Last 24 hours) at 08/25/13 1542 Last data filed at 08/25/13 0957  Gross per 24 hour  Intake 1208.33 ml  Output      0 ml  Net 1208.33 ml   Exam: General: No acute respiratory distress Lungs: Clear to auscultation bilaterally without wheezes or crackles Cardiovascular: Regular rate without murmur gallop or rub normal S1 and S2 Abdomen: Nontender, nondistended, soft, bowel sounds positive, no rebound, no ascites, no appreciable mass Extremities: No significant cyanosis, clubbing, or edema bilateral lower extremities  Data Reviewed: Basic Metabolic Panel:  Recent Labs Lab 08/23/13 2000 08/24/13 0209 08/24/13 1337 08/24/13 1900 08/25/13 0245  NA 139 142 144  --  142  K 3.2* 2.4* 2.5* 3.2* 2.7*  CL 98 100 104  --  104  CO2 17* 16* 16*  --  16*  GLUCOSE 185* 207* 194*  --  101*  BUN 107* 113* 117*  --  112*  CREATININE 5.52* 5.58* 5.73*  --  5.59*  CALCIUM 8.7 8.8 8.8  --  8.6  MG  --  2.5  --   --   --   PHOS 7.3*  --   --   --   --  Liver Function Tests:  Recent Labs Lab 08/23/13 2000  AST 14  ALT 8  ALKPHOS 54  BILITOT 0.3  PROT 5.9*  ALBUMIN 2.6*   CBC:  Recent Labs Lab 08/23/13 2000 08/23/13 2356 08/24/13 0209 08/24/13 0548 08/25/13 0245  WBC 12.0* 10.6* 11.3* 9.3 8.9  NEUTROABS 11.2*  --   --   --   --   HGB 8.8* 8.8* 8.7* 8.8* 9.0*  HCT 26.2* 26.1* 25.8* 25.7* 26.9*  MCV 87.0 87.3 87.5 86.0 86.8  PLT 71* 67* 68* 65* 67*   BNP (last 3 results)  Recent Labs  06/08/13 2145  PROBNP 16467.0*   CBG:  Recent Labs Lab 08/24/13 0811 08/24/13 1247 08/24/13 2126  08/25/13 0739 08/25/13 1159  GLUCAP 210* 190* 103* 94 104*    Recent Results (from the past 240 hour(s))  MRSA PCR SCREENING     Status: None   Collection Time    08/23/13  6:41 PM      Result Value Ref Range Status   MRSA by PCR NEGATIVE  NEGATIVE Final   Comment:            The GeneXpert MRSA Assay (FDA     approved for NASAL specimens     only), is one component of a     comprehensive MRSA colonization     surveillance program. It is not     intended to diagnose MRSA     infection nor to guide or     monitor treatment for     MRSA infections.  CLOSTRIDIUM DIFFICILE BY PCR     Status: Abnormal   Collection Time    08/23/13 10:36 PM      Result Value Ref Range Status   C difficile by pcr POSITIVE (*) NEGATIVE Final   Comment: CRITICAL RESULT CALLED TO, READ BACK BY AND VERIFIED WITH:     LINDSAY J RN 08/24/13 0740 COSTELLO B  STOOL CULTURE     Status: None   Collection Time    08/23/13 10:37 PM      Result Value Ref Range Status   Specimen Description STOOL   Final   Special Requests NONE   Final   Culture     Final   Value: NO SUSPICIOUS COLONIES, CONTINUING TO HOLD     Performed at [facility]   Report Status PENDING   Incomplete     Studies:  Recent x-ray studies have been reviewed in detail by the Attending Physician  Scheduled Meds:  Scheduled Meds: . acyclovir  400 mg Oral Daily  . amiodarone  200 mg Oral Daily  . [START ON 08/29/2013] dexamethasone  40 mg Oral Q7 days  . feeding supplement (RESOURCE BREEZE)  1 Container Oral TID BM  . ferrous sulfate  325 mg Oral Q breakfast  . insulin aspart  0-9 Units Subcutaneous TID WC  . megestrol  800 mg Oral Daily  . metronidazole  500 mg Intravenous Q8H  . sodium chloride  3 mL Intravenous Q12H  . tamsulosin  0.4 mg Oral QPC breakfast  . vancomycin  125 mg Oral QID    Time spent on care of this patient: 35 mins   Sully Dyment T , MD   [facility] Office  [phone] Pager - Text  Page per Shea Evans as per below:  On-Call/Text Page:      [email or web address]      password [email or web address]  If 7PM-7AM, please contact night-coverage [email or web address] Password [facility] 08/25/2013, 3:42 PM  LOS: 2 days

## 2013-08-26 LAB — GLUCOSE, CAPILLARY
GLUCOSE-CAPILLARY: 130 mg/dL — AB (ref 70–99)
Glucose-Capillary: 78 mg/dL (ref 70–99)
Glucose-Capillary: 122 mg/dL — ABNORMAL HIGH (ref 70–99)
Glucose-Capillary: 150 mg/dL — ABNORMAL HIGH (ref 70–99)

## 2013-08-26 LAB — BASIC METABOLIC PANEL
BUN: 105 mg/dL — AB (ref 6–23)
CO2: 19 meq/L (ref 19–32)
CREATININE: 5.29 mg/dL — AB (ref 0.50–1.35)
Calcium: 8.6 mg/dL (ref 8.4–10.5)
Chloride: 105 mEq/L (ref 96–112)
GFR calc Af Amer: 11 mL/min — ABNORMAL LOW (ref >90)
GFR calc non Af Amer: 10 mL/min — ABNORMAL LOW (ref >90)
Glucose, Bld: 92 mg/dL (ref 70–99)
Potassium: 3.8 mEq/L (ref 3.7–5.3)
Sodium: 143 mEq/L (ref 137–147)

## 2013-08-26 LAB — CBC
HEMATOCRIT: 26.2 % — AB (ref 39.0–52.0)
Hemoglobin: 8.7 g/dL — ABNORMAL LOW (ref 13.0–17.0)
MCH: 29.2 pg (ref 26.0–34.0)
MCHC: 33.2 g/dL (ref 30.0–36.0)
MCV: 87.9 fL (ref 78.0–100.0)
Platelets: 64 10*3/uL — ABNORMAL LOW (ref 150–400)
RBC: 2.98 MIL/uL — ABNORMAL LOW (ref 4.22–5.81)
RDW: 15.9 % — AB (ref 11.5–15.5)
WBC: 7.7 10*3/uL (ref 4.0–10.5)

## 2013-08-26 NOTE — Progress Notes (Signed)
[place] TEAM 1 - Stepdown/ICU TEAM Progress Note  Scott [name] [id number]:[id number] DOB: 1940-10-20 DOA: 08/23/2013 PCP: Bufford Buttner, MD  Admit HPI / Brief Narrative: 73 y.o. male with history of multiple myeloma receiving chemotherapy every Tuesday (last tx day bfr admit), chronic kidney disease, anemia, and s/p pacemaker placement who had been experiencing diarrhea for one week. Patient had been having at least 4-5 episodes of watery diarrhea which was dark in color. Denied abdominal pain or nausea/vomiting. Patient became short of breath and also noticed swelling in his lower extremities.  In the ER at [facility] the patient's chest x-ray showed vascular congestion. Patient was found to be hypotensive and creatinine was found to be increased from baseline (as per our records patient's creatinine was around 3.5 last March). Patient was transferred to [facility] due to his worsening renal function. Patient has an AV fistula in the right arm and was originally to followup with vascular surgeon next week for patency.   HPI/Subjective: Pt states he feels better.  Diarrhea has decreased in volume and frequency already.  No cp nor sob.    Assessment/Plan:   C diff colitis Cont current tx and follow   Acute renal failure on chronic kidney disease Baseline crt ~3.5 - likely related to ischemic ATN in setting of volume depletion and hypotension - care as per Nephrology - crt is slowly improving   Metabolic acidosis Due to renal failure + profuse diarrhea - on bicarb IV - follow   Multiple myeloma  Agree that next tx (due Tues) will need to be held in face of acute severe infxn  Multifactorial chronic anemia Follow Hgb trend - no evidence of acute blood loss  Severe Hypokalemia Due to GI losses + poor intake, and now IV bicarb - stabilizing   Thrombocytopenia No evidence of bleeding - follow trend   DM CBG well controlled at this time   Code Status: FULL Family  Communication: no family present at time of exam today  Disposition Plan: SDU  Consultants: Nephrology   Procedures: 6/11 - B LE venous dopplers - no evidence of DVT, superficial thrombosis, or Baker's Cyst 6/11 - TTE - EF 55-60% - no WMA - grade 3 DD   Antibiotics: Acyclovir 6/11 >> Flagyl 6/11 >> Vanc 6/11 >>  DVT prophylaxis: SCDs  Objective: Blood pressure 100/43, pulse 60, temperature 98.4 F (36.9 C), temperature source Oral, resp. rate 17, height _0  (1.651 m), weight 69.219 kg (152 lb 9.6 oz), SpO2 96.00%.  Intake/Output Summary (Last 24 hours) at 08/26/13 1602 Last data filed at 08/26/13 1337  Gross per 24 hour  Intake    550 ml  Output    176 ml  Net    374 ml   Exam: General: No acute respiratory distress at rest  Lungs: Clear to auscultation bilaterally without wheezes or crackles Cardiovascular: Regular rate without murmur gallop or rub Abdomen: Nontender, nondistended, soft, bowel sounds positive, no rebound, no ascites, no appreciable mass Extremities: No significant cyanosis, clubbing, or edema bilateral lower extremities  Data Reviewed: Basic Metabolic Panel:  Recent Labs Lab 08/23/13 2000 08/24/13 0209 08/24/13 1337 08/24/13 1900 08/25/13 0245 08/25/13 1615 08/26/13 0248  NA 139 142 144  --  142 144 143  K 3.2* 2.4* 2.5* 3.2* 2.7* 3.5* 3.8  CL 98 100 104  --  104 106 105  CO2 17* 16* 16*  --  16* 18* 19  GLUCOSE 185* 207* 194*  --  101* 98 92  BUN 107* 113* 117*  --  112* 111* 105*  CREATININE 5.52* 5.58* 5.73*  --  5.59* 5.43* 5.29*  CALCIUM 8.7 8.8 8.8  --  8.6 8.4 8.6  MG  --  2.5  --   --   --   --   --   PHOS 7.3*  --   --   --   --   --   --    Liver Function Tests:  Recent Labs Lab 08/23/13 2000  AST 14  ALT 8  ALKPHOS 54  BILITOT 0.3  PROT 5.9*  ALBUMIN 2.6*   CBC:  Recent Labs Lab 08/23/13 2000 08/23/13 2356 08/24/13 0209 08/24/13 0548 08/25/13 0245 08/26/13 0248  WBC 12.0* 10.6* 11.3* 9.3 8.9 7.7    NEUTROABS 11.2*  --   --   --   --   --   HGB 8.8* 8.8* 8.7* 8.8* 9.0* 8.7*  HCT 26.2* 26.1* 25.8* 25.7* 26.9* 26.2*  MCV 87.0 87.3 87.5 86.0 86.8 87.9  PLT 71* 67* 68* 65* 67* 64*   BNP (last 3 results)  Recent Labs  06/08/13 2145  PROBNP 16467.0*   CBG:  Recent Labs Lab 08/25/13 1809 08/25/13 2239 08/26/13 0732 08/26/13 1048 08/26/13 1504  GLUCAP 108* 91 130* 78 122*    Recent Results (from the past 240 hour(s))  MRSA PCR SCREENING     Status: None   Collection Time    08/23/13  6:41 PM      Result Value Ref Range Status   MRSA by PCR NEGATIVE  NEGATIVE Final   Comment:            The GeneXpert MRSA Assay (FDA     approved for NASAL specimens     only), is one component of a     comprehensive MRSA colonization     surveillance program. It is not     intended to diagnose MRSA     infection nor to guide or     monitor treatment for     MRSA infections.  CLOSTRIDIUM DIFFICILE BY PCR     Status: Abnormal   Collection Time    08/23/13 10:36 PM      Result Value Ref Range Status   C difficile by pcr POSITIVE (*) NEGATIVE Final   Comment: CRITICAL RESULT CALLED TO, READ BACK BY AND VERIFIED WITH:     LINDSAY J RN 08/24/13 0740 COSTELLO B  STOOL CULTURE     Status: None   Collection Time    08/23/13 10:37 PM      Result Value Ref Range Status   Specimen Description STOOL   Final   Special Requests NONE   Final   Culture     Final   Value: NO SUSPICIOUS COLONIES, CONTINUING TO HOLD     Performed at [facility]   Report Status PENDING   Incomplete     Studies:  Recent x-ray studies have been reviewed in detail by the Attending Physician  Scheduled Meds:  Scheduled Meds: . acyclovir  400 mg Oral Daily  . amiodarone  200 mg Oral Daily  . [START ON 08/29/2013] dexamethasone  40 mg Oral Q7 days  . feeding supplement (RESOURCE BREEZE)  1 Container Oral TID BM  . ferrous sulfate  325 mg Oral Q breakfast  . insulin aspart  0-9 Units Subcutaneous TID  WC  . megestrol  800 mg Oral Daily  . metronidazole  500 mg Intravenous Q8H  .  sodium chloride  3 mL Intravenous Q12H  . tamsulosin  0.4 mg Oral QPC breakfast  . vancomycin  125 mg Oral QID    Time spent on care of this patient: 35 mins   Tiki Tucciarone T , MD   [facility] Office  [phone] Pager - Text Page per Shea Evans as per below:  On-Call/Text Page:      [email or web address]      password [email or web address]  If 7PM-7AM, please contact night-coverage [email or web address] Password [email or web address] 08/26/2013, 4:02 PM   LOS: 3 days

## 2013-08-27 LAB — GLUCOSE, CAPILLARY
GLUCOSE-CAPILLARY: 115 mg/dL — AB (ref 70–99)
GLUCOSE-CAPILLARY: 108 mg/dL — AB (ref 70–99)
Glucose-Capillary: 101 mg/dL — ABNORMAL HIGH (ref 70–99)
Glucose-Capillary: 98 mg/dL (ref 70–99)
Glucose-Capillary: 135 mg/dL — ABNORMAL HIGH (ref 70–99)
Glucose-Capillary: 115 mg/dL — ABNORMAL HIGH (ref 70–99)

## 2013-08-27 LAB — BASIC METABOLIC PANEL
BUN: 101 mg/dL — ABNORMAL HIGH (ref 6–23)
CHLORIDE: 103 meq/L (ref 96–112)
CO2: 23 mEq/L (ref 19–32)
CREATININE: 4.74 mg/dL — AB (ref 0.50–1.35)
Calcium: 8.1 mg/dL — ABNORMAL LOW (ref 8.4–10.5)
GFR calc Af Amer: 13 mL/min — ABNORMAL LOW (ref >90)
GFR calc non Af Amer: 11 mL/min — ABNORMAL LOW (ref >90)
GLUCOSE: 142 mg/dL — AB (ref 70–99)
Potassium: 3.9 mEq/L (ref 3.7–5.3)
Sodium: 143 mEq/L (ref 137–147)

## 2013-08-27 LAB — STOOL CULTURE

## 2013-08-27 LAB — CBC
HEMATOCRIT: 25.4 % — AB (ref 39.0–52.0)
Hemoglobin: 8.5 g/dL — ABNORMAL LOW (ref 13.0–17.0)
MCH: 29.6 pg (ref 26.0–34.0)
MCHC: 33.5 g/dL (ref 30.0–36.0)
MCV: 88.5 fL (ref 78.0–100.0)
Platelets: 78 10*3/uL — ABNORMAL LOW (ref 150–400)
RBC: 2.87 MIL/uL — ABNORMAL LOW (ref 4.22–5.81)
RDW: 16.1 % — ABNORMAL HIGH (ref 11.5–15.5)
WBC: 15.1 10*3/uL — AB (ref 4.0–10.5)

## 2013-08-27 MED ORDER — CEFAZOLIN SODIUM 1-5 GM-% IV SOLN
1.0000 g | INTRAVENOUS | Status: AC
  Administered 2013-08-28: 1 g via INTRAVENOUS
  Filled 2013-08-27: qty 50

## 2013-08-27 MED ORDER — WHITE PETROLATUM GEL
Status: AC
  Administered 2013-08-27: 1
  Filled 2013-08-27: qty 5

## 2013-08-27 NOTE — Progress Notes (Signed)
TEAM 1 - Stepdown/ICU TEAM Progress Note  LEVERNE AMRHEIN EXB:[id number] DOB: 01/31/1941 DOA: 08/23/2013 PCP: Bufford Buttner, MD  Admit HPI / Brief Narrative: 73 y.o. male with history of multiple myeloma receiving chemotherapy every Tuesday (last tx day bfr admit), chronic kidney disease, anemia, and s/p pacemaker placement who had been experiencing diarrhea for one week. Patient had been having at least 4-5 episodes of watery diarrhea which was dark in color. Denied abdominal pain or nausea/vomiting. Patient became short of breath and also noticed swelling in his lower extremities.  In the ER at [facility] the patient's chest x-ray showed vascular congestion. Patient was found to be hypotensive and creatinine was found to be increased from baseline (as per our records patient's creatinine was around 3.5 last March). Patient was transferred to [facility] due to his worsening renal function. Patient has an AV fistula in the right arm and was originally to followup with vascular surgeon next week for patency.   HPI/Subjective: Reports some intermittent SOB, and a near continuous nausea.  No vomiting.  Diarrhea w/o signif change today.    Assessment/Plan:   C diff colitis Cont current tx and follow   Acute renal failure on chronic kidney disease Baseline crt ~3.5 - likely related to ischemic ATN in setting of volume depletion and hypotension - care as per Nephrology - crt is very slowly improving, but BUN is not signif improved and pt beginning to display uremic sx  Metabolic acidosis Due to renal failure + profuse diarrhea - on bicarb IV - stabilizing   Multiple myeloma  Agree that next tx (due Tues) will need to be held in face of acute severe infxn  Multifactorial chronic anemia Follow Hgb trend - no evidence of acute blood loss  Severe Hypokalemia Due to GI losses + poor intake, and now IV bicarb - stabilizing   Thrombocytopenia No evidence of bleeding - follow  trend   DM CBG well controlled at this time   Code Status: FULL Family Communication: no family present at time of exam today  Disposition Plan: SDU  Consultants: Nephrology   Procedures: 6/11 - B LE venous dopplers - no evidence of DVT, superficial thrombosis, or Baker's Cyst 6/11 - TTE - EF 55-60% - no WMA - grade 3 DD   Antibiotics: Acyclovir 6/11 >> Flagyl 6/11 >> Vanc 6/11 >>  DVT prophylaxis: SCDs  Objective: Blood pressure 100/50, pulse 64, temperature 97.7 F (36.5 C), temperature source Oral, resp. rate 22, height 5' 5"  (1.651 m), weight 71.6 kg (157 lb 13.6 oz), SpO2 99.00%.  Intake/Output Summary (Last 24 hours) at 08/27/13 1559 Last data filed at 08/27/13 1500  Gross per 24 hour  Intake   1250 ml  Output    745 ml  Net    505 ml   Exam: General: No acute respiratory distress presently  Lungs: Clear to auscultation bilaterally without wheezes or crackles Cardiovascular: Regular rate without murmur gallop or rub Abdomen: Nontender, nondistended, soft, bowel sounds positive, no rebound, no ascites, no appreciable mass Extremities: No significant cyanosis, clubbing, or edema bilateral lower extremities  Data Reviewed: Basic Metabolic Panel:  Recent Labs Lab 08/23/13 2000 08/24/13 0209 08/24/13 1337 08/24/13 1900 08/25/13 0245 08/25/13 1615 08/26/13 0248 08/27/13 0242  NA 139 142 144  --  142 144 143 143  K 3.2* 2.4* 2.5* 3.2* 2.7* 3.5* 3.8 3.9  CL 98 100 104  --  104 106 105 103  CO2 17* 16* 16*  --  16* 18* 19 23  GLUCOSE 185* 207* 194*  --  101* 98 92 142*  BUN 107* 113* 117*  --  112* 111* 105* 101*  CREATININE 5.52* 5.58* 5.73*  --  5.59* 5.43* 5.29* 4.74*  CALCIUM 8.7 8.8 8.8  --  8.6 8.4 8.6 8.1*  MG  --  2.5  --   --   --   --   --   --   PHOS 7.3*  --   --   --   --   --   --   --    Liver Function Tests:  Recent Labs Lab 08/23/13 2000  AST 14  ALT 8  ALKPHOS 54  BILITOT 0.3  PROT 5.9*  ALBUMIN 2.6*   CBC:  Recent  Labs Lab 08/23/13 2000  08/24/13 0209 08/24/13 0548 08/25/13 0245 08/26/13 0248 08/27/13 0242  WBC 12.0*  < > 11.3* 9.3 8.9 7.7 15.1*  NEUTROABS 11.2*  --   --   --   --   --   --   HGB 8.8*  < > 8.7* 8.8* 9.0* 8.7* 8.5*  HCT 26.2*  < > 25.8* 25.7* 26.9* 26.2* 25.4*  MCV 87.0  < > 87.5 86.0 86.8 87.9 88.5  PLT 71*  < > 68* 65* 67* 64* 78*  < > = values in this interval not displayed.  BNP (last 3 results)  Recent Labs  06/08/13 2145  PROBNP 16467.0*   CBG:  Recent Labs Lab 08/26/13 2156 08/27/13 0725 08/27/13 0745 08/27/13 1159 08/27/13 1453  GLUCAP 150* 101* 98 135* 115*    Recent Results (from the past 240 hour(s))  MRSA PCR SCREENING     Status: None   Collection Time    08/23/13  6:41 PM      Result Value Ref Range Status   MRSA by PCR NEGATIVE  NEGATIVE Final   Comment:            The GeneXpert MRSA Assay (FDA     approved for NASAL specimens     only), is one component of a     comprehensive MRSA colonization     surveillance program. It is not     intended to diagnose MRSA     infection nor to guide or     monitor treatment for     MRSA infections.  CLOSTRIDIUM DIFFICILE BY PCR     Status: Abnormal   Collection Time    08/23/13 10:36 PM      Result Value Ref Range Status   C difficile by pcr POSITIVE (*) NEGATIVE Final   Comment: CRITICAL RESULT CALLED TO, READ BACK BY AND VERIFIED WITH:     LINDSAY J RN 08/24/13 0740 COSTELLO B  STOOL CULTURE     Status: None   Collection Time    08/23/13 10:37 PM      Result Value Ref Range Status   Specimen Description STOOL   Final   Special Requests NONE   Final   Culture     Final   Value: NO SALMONELLA, SHIGELLA, CAMPYLOBACTER, YERSINIA, OR E.COLI 0157:H7 ISOLATED     Performed at [facility]   Report Status 08/27/2013 FINAL   Final     Studies:  Recent x-ray studies have been reviewed in detail by the Attending Physician  Scheduled Meds:  Scheduled Meds: . acyclovir  400 mg Oral Daily   . amiodarone  200 mg Oral Daily  . feeding supplement (RESOURCE  BREEZE)  1 Container Oral TID BM  . ferrous sulfate  325 mg Oral Q breakfast  . insulin aspart  0-9 Units Subcutaneous TID WC  . megestrol  800 mg Oral Daily  . metronidazole  500 mg Intravenous Q8H  . sodium chloride  3 mL Intravenous Q12H  . tamsulosin  0.4 mg Oral QPC breakfast  . vancomycin  125 mg Oral QID    Time spent on care of this patient: 25 mins   Mishaal Lansdale T , MD   [facility] Office  [phone] Pager - Text Page per Shea Evans as per below:  On-Call/Text Page:      [email or web address]      password [email or web address]  If 7PM-7AM, please contact night-coverage [email or web address] Password [email or web address] 08/27/2013, 3:59 PM   LOS: 4 days

## 2013-08-27 NOTE — Progress Notes (Signed)
Assessment/Plan:  1. AKI/CKD- likely related to ischemic ATN in setting of volume depletion and hypotension. Needs dialysis due to uremic symptoms 2. C. Diff colitis- on po vanco 3. Multiple myeloma- recently finished a cycle of chemo.Next cycle due but prob should be delayed with infx 4.   AV Fistula, inadequate  I have contacted [facility] for PC and reevaluation of inadequate fistula.  Initiate dialysis in AM  Subjective: Interval History: No appetite. Malaise.  Objective: Vital signs in last 24 hours: Temp:  [97.5 F (36.4 C)-98 F (36.7 C)] 97.7 F (36.5 C) (06/14 1500) Pulse Rate:  [60-65] 64 (06/14 1455) Resp:  [16-22] 22 (06/14 1455) BP: (100-121)/(49-59) 100/50 mmHg (06/14 1455) SpO2:  [94 %-100 %] 99 % (06/14 1455) Weight:  [71.6 kg (157 lb 13.6 oz)] 71.6 kg (157 lb 13.6 oz) (06/14 0445) Weight change:   Intake/Output from previous day: 06/13 0701 - 06/14 0700 In: 1350 [I.V.:1050; IV Piggyback:300] Out: 750 [Urine:750] Intake/Output this shift: Total I/O In: 200 [I.V.:100; IV Piggyback:100] Out: 170 [Urine:170]  General appearance: alert GI: soft, non-tender; bowel sounds normal; no masses,  no organomegaly No edema Moon facies Positive asterixis  Lab Results:  Recent Labs  08/26/13 0248 08/27/13 0242  WBC 7.7 15.1*  HGB 8.7* 8.5*  HCT 26.2* 25.4*  PLT 64* 78*   BMET:  Recent Labs  08/26/13 0248 08/27/13 0242  NA 143 143  K 3.8 3.9  CL 105 103  CO2 19 23  GLUCOSE 92 142*  BUN 105* 101*  CREATININE 5.29* 4.74*  CALCIUM 8.6 8.1*   No results found for this basename: PTH,  in the last 72 hours Iron Studies: No results found for this basename: IRON, TIBC, TRANSFERRIN, FERRITIN,  in the last 72 hours Studies/Results: No results found.  Scheduled: . acyclovir  400 mg Oral Daily  . amiodarone  200 mg Oral Daily  . feeding supplement (RESOURCE BREEZE)  1 Container Oral TID BM  . ferrous sulfate  325 mg Oral Q breakfast  . insulin aspart  0-9 Units  Subcutaneous TID WC  . megestrol  800 mg Oral Daily  . metronidazole  500 mg Intravenous Q8H  . sodium chloride  3 mL Intravenous Q12H  . tamsulosin  0.4 mg Oral QPC breakfast  . vancomycin  125 mg Oral QID     LOS: 4 days   Mekhi Lascola C 08/27/2013,3:48 PM

## 2013-08-28 ENCOUNTER — Inpatient Hospital Stay (HOSPITAL_COMMUNITY): Payer: Medicare HMO

## 2013-08-28 ENCOUNTER — Encounter (HOSPITAL_COMMUNITY)
Admission: AD | Disposition: A | Discharge status: Skilled Nursing Facility | Payer: Self-pay | Source: Other Acute Inpatient Hospital | Attending: Internal Medicine

## 2013-08-28 ENCOUNTER — Inpatient Hospital Stay (HOSPITAL_COMMUNITY): Payer: Medicare HMO | Admitting: Anesthesiology

## 2013-08-28 ENCOUNTER — Encounter (HOSPITAL_COMMUNITY): Payer: Medicare HMO | Admitting: Anesthesiology

## 2013-08-28 ENCOUNTER — Encounter (HOSPITAL_COMMUNITY): Payer: Self-pay | Admitting: Anesthesiology

## 2013-08-28 DIAGNOSIS — N184 Chronic kidney disease, stage 4 (severe): Secondary | ICD-10-CM

## 2013-08-28 HISTORY — PX: INSERTION OF DIALYSIS CATHETER: SHX1324

## 2013-08-28 LAB — GLUCOSE, CAPILLARY
GLUCOSE-CAPILLARY: 87 mg/dL (ref 70–99)
GLUCOSE-CAPILLARY: 110 mg/dL — AB (ref 70–99)
Glucose-Capillary: 85 mg/dL (ref 70–99)
Glucose-Capillary: 81 mg/dL (ref 70–99)
Glucose-Capillary: 111 mg/dL — ABNORMAL HIGH (ref 70–99)

## 2013-08-28 LAB — RENAL FUNCTION PANEL
ALBUMIN: 2.1 g/dL — AB (ref 3.5–5.2)
BUN: 96 mg/dL — ABNORMAL HIGH (ref 6–23)
CALCIUM: 8.2 mg/dL — AB (ref 8.4–10.5)
CO2: 27 mEq/L (ref 19–32)
CREATININE: 4.53 mg/dL — AB (ref 0.50–1.35)
Chloride: 102 mEq/L (ref 96–112)
GFR, EST AFRICAN AMERICAN: 14 mL/min — AB (ref >90)
GFR, EST NON AFRICAN AMERICAN: 12 mL/min — AB (ref >90)
Glucose, Bld: 117 mg/dL — ABNORMAL HIGH (ref 70–99)
PHOSPHORUS: 3.5 mg/dL (ref 2.3–4.6)
Potassium: 3.2 mEq/L — ABNORMAL LOW (ref 3.7–5.3)
Sodium: 144 mEq/L (ref 137–147)

## 2013-08-28 LAB — CBC
HEMATOCRIT: 23.9 % — AB (ref 39.0–52.0)
HEMOGLOBIN: 7.9 g/dL — AB (ref 13.0–17.0)
MCH: 29.4 pg (ref 26.0–34.0)
MCHC: 33.1 g/dL (ref 30.0–36.0)
MCV: 88.8 fL (ref 78.0–100.0)
Platelets: 70 10*3/uL — ABNORMAL LOW (ref 150–400)
RBC: 2.69 MIL/uL — AB (ref 4.22–5.81)
RDW: 16.4 % — AB (ref 11.5–15.5)
WBC: 20.1 10*3/uL — AB (ref 4.0–10.5)

## 2013-08-28 LAB — HEPATITIS B CORE ANTIBODY, TOTAL: HEP B C TOTAL AB: REACTIVE — AB

## 2013-08-28 LAB — HEPATITIS B SURFACE ANTIGEN: Hepatitis B Surface Ag: NEGATIVE

## 2013-08-28 LAB — HEPATITIS B SURFACE ANTIBODY,QUALITATIVE: Hep B S Ab: POSITIVE — AB

## 2013-08-28 SURGERY — INSERTION OF DIALYSIS CATHETER
Anesthesia: Monitor Anesthesia Care | Laterality: Right

## 2013-08-28 MED ORDER — ALTEPLASE 100 MG IV SOLR
4.0000 mg | INTRAVENOUS | Status: AC
  Administered 2013-08-28: 4 mg
  Filled 2013-08-28: qty 4

## 2013-08-28 MED ORDER — FENTANYL CITRATE 0.05 MG/ML IJ SOLN
INTRAMUSCULAR | Status: AC
  Filled 2013-08-28: qty 5

## 2013-08-28 MED ORDER — HEPARIN SODIUM (PORCINE) 5000 UNIT/ML IJ SOLN
INTRAMUSCULAR | Status: DC | PRN
  Administered 2013-08-28: 11:00:00

## 2013-08-28 MED ORDER — PROPOFOL 10 MG/ML IV BOLUS
INTRAVENOUS | Status: AC
  Filled 2013-08-28: qty 20

## 2013-08-28 MED ORDER — ALTEPLASE 100 MG IV SOLR: 4.0000 mg | Freq: Once | INTRAVENOUS | Status: DC

## 2013-08-28 MED ORDER — DARBEPOETIN ALFA-POLYSORBATE 60 MCG/0.3ML IJ SOLN
60.0000 ug | INTRAMUSCULAR | Status: DC
  Administered 2013-09-05: 60 ug via INTRAVENOUS
  Filled 2013-08-28 (×2): qty 0.3

## 2013-08-28 MED ORDER — LIDOCAINE-EPINEPHRINE 0.5 %-1:200000 IJ SOLN
INTRAMUSCULAR | Status: DC | PRN
  Administered 2013-08-28: 10 mL

## 2013-08-28 MED ORDER — HEPARIN SODIUM (PORCINE) 1000 UNIT/ML IJ SOLN
INTRAMUSCULAR | Status: AC
  Filled 2013-08-28: qty 1

## 2013-08-28 MED ORDER — HEPARIN SODIUM (PORCINE) 1000 UNIT/ML IJ SOLN
INTRAMUSCULAR | Status: DC | PRN
  Administered 2013-08-28: 4.6 mL

## 2013-08-28 MED ORDER — INSULIN ASPART 100 UNIT/ML ~~LOC~~ SOLN: 0.0000 [IU] | Freq: Three times a day (TID) | SUBCUTANEOUS | Status: DC

## 2013-08-28 MED ORDER — IPRATROPIUM-ALBUTEROL 0.5-2.5 (3) MG/3ML IN SOLN
3.0000 mL | Freq: Once | RESPIRATORY_TRACT | Status: AC
  Administered 2013-08-28: 3 mL via RESPIRATORY_TRACT

## 2013-08-28 MED ORDER — SODIUM CHLORIDE 0.9 % IV SOLN: INTRAVENOUS | Status: DC

## 2013-08-28 MED ORDER — FENTANYL CITRATE 0.05 MG/ML IJ SOLN: 25.0000 ug | INTRAMUSCULAR | Status: DC | PRN

## 2013-08-28 MED ORDER — FENTANYL CITRATE 0.05 MG/ML IJ SOLN
INTRAMUSCULAR | Status: DC | PRN
  Administered 2013-08-28: 50 ug via INTRAVENOUS

## 2013-08-28 MED ORDER — IPRATROPIUM-ALBUTEROL 0.5-2.5 (3) MG/3ML IN SOLN
RESPIRATORY_TRACT | Status: AC
  Filled 2013-08-28: qty 3

## 2013-08-28 MED ORDER — 0.9 % SODIUM CHLORIDE (POUR BTL) OPTIME
TOPICAL | Status: DC | PRN
  Administered 2013-08-28: 1000 mL

## 2013-08-28 MED ORDER — OXYCODONE HCL 5 MG/5ML PO SOLN: 5.0000 mg | Freq: Once | ORAL | Status: DC | PRN

## 2013-08-28 MED ORDER — METOCLOPRAMIDE HCL 5 MG/ML IJ SOLN: 10.0000 mg | Freq: Once | INTRAMUSCULAR | Status: DC | PRN

## 2013-08-28 MED ORDER — POTASSIUM CHLORIDE CRYS ER 20 MEQ PO TBCR
40.0000 meq | EXTENDED_RELEASE_TABLET | Freq: Once | ORAL | Status: AC
  Administered 2013-08-28: 40 meq via ORAL
  Filled 2013-08-28: qty 2

## 2013-08-28 MED ORDER — SODIUM CHLORIDE 0.9 % IV SOLN
INTRAVENOUS | Status: DC | PRN
  Administered 2013-08-28: 10:00:00 via INTRAVENOUS

## 2013-08-28 MED ORDER — MIDAZOLAM HCL 5 MG/5ML IJ SOLN
INTRAMUSCULAR | Status: DC | PRN
  Administered 2013-08-28: 1 mg via INTRAVENOUS

## 2013-08-28 MED ORDER — OXYCODONE HCL 5 MG PO TABS: 5.0000 mg | ORAL_TABLET | Freq: Once | ORAL | Status: DC | PRN

## 2013-08-28 MED ORDER — PROPOFOL INFUSION 10 MG/ML OPTIME
INTRAVENOUS | Status: DC | PRN
Start: 2013-08-28 — End: 2013-08-28
  Administered 2013-08-28: 25 ug/kg/min via INTRAVENOUS

## 2013-08-28 MED ORDER — MIDAZOLAM HCL 2 MG/2ML IJ SOLN
INTRAMUSCULAR | Status: AC
  Filled 2013-08-28: qty 2

## 2013-08-28 MED ORDER — LIDOCAINE-EPINEPHRINE 0.5 %-1:200000 IJ SOLN
INTRAMUSCULAR | Status: AC
  Filled 2013-08-28: qty 1

## 2013-08-28 SURGICAL SUPPLY — 45 items
BAG DECANTER FOR FLEXI CONT (MISCELLANEOUS) ×3 IMPLANT
BENZOIN TINCTURE PRP APPL 2/3 (GAUZE/BANDAGES/DRESSINGS) ×3 IMPLANT
BLADE 10 SAFETY STRL DISP (BLADE) ×3 IMPLANT
CATH CANNON HEMO 15F 50CM (CATHETERS) IMPLANT
CATH CANNON HEMO 15FR 19 (HEMODIALYSIS SUPPLIES) IMPLANT
CATH CANNON HEMO 15FR 23CM (HEMODIALYSIS SUPPLIES) ×3 IMPLANT
CATH CANNON HEMO 15FR 31CM (HEMODIALYSIS SUPPLIES) IMPLANT
CATH CANNON HEMO 15FR 32CM (HEMODIALYSIS SUPPLIES) IMPLANT
CLOSURE WOUND 1/2 X4 (GAUZE/BANDAGES/DRESSINGS) ×1
COVER PROBE W GEL 5X96 (DRAPES) IMPLANT
COVER SURGICAL LIGHT HANDLE (MISCELLANEOUS) ×3 IMPLANT
DECANTER SPIKE VIAL GLASS SM (MISCELLANEOUS) ×3 IMPLANT
DRAPE C-ARM 42X72 X-RAY (DRAPES) ×3 IMPLANT
DRAPE CHEST BREAST 15X10 FENES (DRAPES) ×3 IMPLANT
GAUZE SPONGE 2X2 8PLY STRL LF (GAUZE/BANDAGES/DRESSINGS) ×1 IMPLANT
GAUZE SPONGE 4X4 16PLY XRAY LF (GAUZE/BANDAGES/DRESSINGS) ×3 IMPLANT
GLOVE BIOGEL PI IND STRL 6.5 (GLOVE) ×1 IMPLANT
GLOVE BIOGEL PI IND STRL 7.0 (GLOVE) ×1 IMPLANT
GLOVE BIOGEL PI INDICATOR 6.5 (GLOVE) ×2
GLOVE BIOGEL PI INDICATOR 7.0 (GLOVE) ×2
GLOVE ECLIPSE 6.5 STRL STRAW (GLOVE) ×3 IMPLANT
GLOVE SS BIOGEL STRL SZ 7.5 (GLOVE) ×1 IMPLANT
GLOVE SUPERSENSE BIOGEL SZ 7.5 (GLOVE) ×2
GOWN STRL REUS W/ TWL LRG LVL3 (GOWN DISPOSABLE) ×2 IMPLANT
GOWN STRL REUS W/TWL LRG LVL3 (GOWN DISPOSABLE) ×4
KIT BASIN OR (CUSTOM PROCEDURE TRAY) ×3 IMPLANT
KIT ROOM TURNOVER OR (KITS) ×3 IMPLANT
NEEDLE 18GX1X1/2 (RX/OR ONLY) (NEEDLE) ×3 IMPLANT
NEEDLE 22X1 1/2 (OR ONLY) (NEEDLE) ×3 IMPLANT
NEEDLE HYPO 25GX1X1/2 BEV (NEEDLE) ×3 IMPLANT
NS IRRIG 1000ML POUR BTL (IV SOLUTION) ×3 IMPLANT
PACK SURGICAL SETUP 50X90 (CUSTOM PROCEDURE TRAY) ×3 IMPLANT
PAD ARMBOARD 7.5X6 YLW CONV (MISCELLANEOUS) ×6 IMPLANT
SOAP 2 % CHG 4 OZ (WOUND CARE) ×3 IMPLANT
SPONGE GAUZE 2X2 STER 10/PKG (GAUZE/BANDAGES/DRESSINGS) ×2
STRIP CLOSURE SKIN 1/2X4 (GAUZE/BANDAGES/DRESSINGS) ×2 IMPLANT
SUT ETHILON 3 0 PS 1 (SUTURE) ×3 IMPLANT
SUT VICRYL 4-0 PS2 18IN ABS (SUTURE) ×3 IMPLANT
SYR 20CC LL (SYRINGE) ×3 IMPLANT
SYR 5ML LL (SYRINGE) ×6 IMPLANT
SYR CONTROL 10ML LL (SYRINGE) ×3 IMPLANT
SYRINGE 10CC LL (SYRINGE) ×3 IMPLANT
TOWEL OR 17X24 6PK STRL BLUE (TOWEL DISPOSABLE) ×3 IMPLANT
TOWEL OR 17X26 10 PK STRL BLUE (TOWEL DISPOSABLE) ×3 IMPLANT
WATER STERILE IRR 1000ML POUR (IV SOLUTION) ×3 IMPLANT

## 2013-08-28 NOTE — Anesthesia Postprocedure Evaluation (Signed)
Anesthesia Post Note  Patient: Scott [name]  Procedure(s) Performed: Procedure(s) (LRB): INSERTION OF DIALYSIS CATHETER in Right Internal Jugular (Right)  Anesthesia type: MAC  Patient location: PACU  Post pain: Pain level controlled  Post assessment: Patient's Cardiovascular Status Stable  Last Vitals:  Filed Vitals:   08/28/13 1222  BP: 126/53  Pulse: 59  Temp: 36.1 C  Resp: 19    Post vital signs: Reviewed and stable  Level of consciousness: alert  Complications: No apparent anesthesia complications

## 2013-08-28 NOTE — Progress Notes (Signed)
Utilization review completed.  

## 2013-08-28 NOTE — Transfer of Care (Signed)
Immediate Anesthesia Transfer of Care Note  Patient: Scott [name]  Procedure(s) Performed: Procedure(s): INSERTION OF DIALYSIS CATHETER in Right Internal Jugular (Right)  Patient Location: PACU  Anesthesia Type:MAC  Level of Consciousness: awake, alert  and oriented  Airway & Oxygen Therapy: Patient Spontanous Breathing and Patient connected to face mask oxygen  Post-op Assessment: Report given to PACU RN  Post vital signs: Reviewed and stable  Complications: No apparent anesthesia complications

## 2013-08-28 NOTE — Progress Notes (Signed)
Hemodialysis= pt received 2 hours treatment before new cath exhibited very high venous pressures (450). Flushed lumens q5 minutes however would not run over 150bfr. Pt rinsed back to prevent clotting. Dr. Lorrene Reid notified. Order to [facility] overnight, pt will be dialyzed again tomorrow.

## 2013-08-28 NOTE — Progress Notes (Signed)
Assessment/Plan:  1. AKI/CKD- likely related to ischemic ATN in setting of volume depletion and hypotension. Needs dialysis due to uremic symptoms. Advanced CKD at baseline (3.5 creatinine) New ESRD.  First HD today, HD #2 tomorrow; check iron studies and PTH; start Aranesp; CLIP process started 2. C. Diff colitis- on po vanco 3. Multiple myeloma- recently finished a cycle of chemo.Next cycle due but prob should be delayed with infx 4.   AV Fistula, inadequate - Dr. Donnetta Hutching indicates will re-evaluate for another long term access  Subjective: Interval History:  No appetite.  In dialysis and tolerating well so far [facility] today  Objective: Vital signs in last 24 hours: Temp:  [97 F (36.1 C)-98.3 F (36.8 C)] 98 F (36.7 C) (06/15 1448) Pulse Rate:  [57-67] 59 (06/15 1600) Resp:  [13-24] 13 (06/15 1600) BP: (103-126)/(43-57) 122/57 mmHg (06/15 1600) SpO2:  [89 %-98 %] 92 % (06/15 1600) Weight:  [72 kg (158 lb 11.7 oz)-72.6 kg (160 lb 0.9 oz)] 72 kg (158 lb 11.7 oz) (06/15 1448) Weight change: 1 kg (2 lb 3.3 oz)  Intake/Output from previous day: 06/14 0701 - 06/15 0700 In: 550 [I.V.:450; IV Piggyback:100] Out: 495 [Urine:495] Intake/Output this shift: Total I/O In: 1103.3 [I.V.:903.3; IV Piggyback:200] Out: 150 [Urine:150]  General appearance: alert BP 122/57  Pulse 59  Temp(Src) 98 F (36.7 C) (Oral)  Resp 13  Ht 5' 5"  (1.651 m)  Wt 72 kg (158 lb 11.7 oz)  BMI 26.41 kg/m2  SpO2 92% New right TDC with dressing intact GI: soft, non-tender; bowel sounds normal; no masses,  no organomegaly No edema Moon facies Positive asterixis  Lab Results:  Recent Labs  08/26/13 0248 08/27/13 0242  WBC 7.7 15.1*  HGB 8.7* 8.5*  HCT 26.2* 25.4*  PLT 64* 78*   BMET:   Recent Labs  08/27/13 0242 08/28/13 1500  NA 143 144  K 3.9 3.2*  CL 103 102  CO2 23 27  GLUCOSE 142* 117*  BUN 101* 96*  CREATININE 4.74* 4.53*  CALCIUM 8.1* 8.2*   Studies/Results: Dg Chest 1  View  08/28/2013   CLINICAL DATA:  Central catheter placement  EXAM: CHEST - 1 VIEW  COMPARISON:  Study obtained earlier in the day  FINDINGS: Dual-lumen central catheter tip is at the cavoatrial junction. No pneumothorax. There is moderate generalized interstitial edema with patchy alveolar edema in the bases. Heart is mildly enlarged with mild pulmonary venous hypertension. No adenopathy.  IMPRESSION: Central catheter tip at cavoatrial junction. No pneumothorax. Underlying congestive heart failure. Superimposed pneumonia in the bases cannot be excluded radiographically.   Electronically Signed   By: Lowella Grip M.D.   On: 08/28/2013 12:28   Dg Chest Port 1 View  08/28/2013   CLINICAL DATA:  Shortness of breath.  EXAM: PORTABLE CHEST - 1 VIEW  COMPARISON:  Chest radiograph from 08/23/2013  FINDINGS: The lungs are well-aerated. Bibasilar airspace opacification may reflect multifocal pneumonia or pulmonary edema. Small bilateral pleural effusions are suspected, though the costophrenic angles are incompletely imaged on this study. No pneumothorax is seen.  The cardiomediastinal silhouette is borderline normal in size. A pacemaker is noted overlying the left chest wall, with leads ending overlying the right atrium and right ventricle. No acute osseous abnormalities are seen.  IMPRESSION: Bibasilar airspace opacification may reflect multifocal pneumonia or pulmonary edema. Suspect small bilateral pleural effusions.   Electronically Signed   By: Garald Balding M.D.   On: 08/28/2013 03:58    Scheduled: .  acyclovir  400 mg Oral Daily  . amiodarone  200 mg Oral Daily  . feeding supplement (RESOURCE BREEZE)  1 Container Oral TID BM  . ferrous sulfate  325 mg Oral Q breakfast  . insulin aspart  0-9 Units Subcutaneous TID WC  . megestrol  800 mg Oral Daily  . metronidazole  500 mg Intravenous Q8H  . sodium chloride  3 mL Intravenous Q12H  . tamsulosin  0.4 mg Oral QPC breakfast  . vancomycin  125 mg Oral  QID     LOS: 5 days   Ambrose Wile B 08/28/2013,4:26 PM

## 2013-08-28 NOTE — Progress Notes (Signed)
Pt c/o sudden" sob and not being able to take a deep breath feeling like he is choking" . NP paged. Will continue monitor pt. NP returned page and gave orders (see mar). Orders followed.  [id number] pt stated that he was feeling some relief. Will continue to monitor

## 2013-08-28 NOTE — H&P (View-Only) (Signed)
Assessment/Plan:  1. AKI/CKD- likely related to ischemic ATN in setting of volume depletion and hypotension. Needs dialysis due to uremic symptoms 2. C. Diff colitis- on po vanco 3. Multiple myeloma- recently finished a cycle of chemo.Next cycle due but prob should be delayed with infx 4.   AV Fistula, inadequate  I have contacted [facility] for PC and reevaluation of inadequate fistula.  Initiate dialysis in AM  Subjective: Interval History: No appetite. Malaise.  Objective: Vital signs in last 24 hours: Temp:  [97.5 F (36.4 C)-98 F (36.7 C)] 97.7 F (36.5 C) (06/14 1500) Pulse Rate:  [60-65] 64 (06/14 1455) Resp:  [16-22] 22 (06/14 1455) BP: (100-121)/(49-59) 100/50 mmHg (06/14 1455) SpO2:  [94 %-100 %] 99 % (06/14 1455) Weight:  [71.6 kg (157 lb 13.6 oz)] 71.6 kg (157 lb 13.6 oz) (06/14 0445) Weight change:   Intake/Output from previous day: 06/13 0701 - 06/14 0700 In: 1350 [I.V.:1050; IV Piggyback:300] Out: 750 [Urine:750] Intake/Output this shift: Total I/O In: 200 [I.V.:100; IV Piggyback:100] Out: 170 [Urine:170]  General appearance: alert GI: soft, non-tender; bowel sounds normal; no masses,  no organomegaly No edema Moon facies Positive asterixis  Lab Results:  Recent Labs  08/26/13 0248 08/27/13 0242  WBC 7.7 15.1*  HGB 8.7* 8.5*  HCT 26.2* 25.4*  PLT 64* 78*   BMET:  Recent Labs  08/26/13 0248 08/27/13 0242  NA 143 143  K 3.8 3.9  CL 105 103  CO2 19 23  GLUCOSE 92 142*  BUN 105* 101*  CREATININE 5.29* 4.74*  CALCIUM 8.6 8.1*   No results found for this basename: PTH,  in the last 72 hours Iron Studies: No results found for this basename: IRON, TIBC, TRANSFERRIN, FERRITIN,  in the last 72 hours Studies/Results: No results found.  Scheduled: . acyclovir  400 mg Oral Daily  . amiodarone  200 mg Oral Daily  . feeding supplement (RESOURCE BREEZE)  1 Container Oral TID BM  . ferrous sulfate  325 mg Oral Q breakfast  . insulin aspart  0-9 Units  Subcutaneous TID WC  . megestrol  800 mg Oral Daily  . metronidazole  500 mg Intravenous Q8H  . sodium chloride  3 mL Intravenous Q12H  . tamsulosin  0.4 mg Oral QPC breakfast  . vancomycin  125 mg Oral QID     LOS: 4 days   Zaccary Creech C 08/27/2013,3:48 PM

## 2013-08-28 NOTE — Op Note (Signed)
    OPERATIVE REPORT  DATE OF SURGERY: 08/28/2013  PATIENT: Scott [name], 73 y.o. male MRN: [id number]  DOB: 01-15-1941  PRE-OPERATIVE DIAGNOSIS: End-stage renal disease  POST-OPERATIVE DIAGNOSIS:  Same  PROCEDURE: Right IJ hemodialysis catheter  SURGEON:  Curt Jews, M.D.  PHYSICIAN ASSISTANT: Nurse  ANESTHESIA:  Local with sedation  EBL: Minimal ml  Total I/O In: 1003.3 [I.V.:803.3; IV Piggyback:200] Out: 150 [Urine:150]  BLOOD ADMINISTERED: None  DRAINS: None  SPECIMEN: None  COUNTS CORRECT:  YES  PLAN OF CARE: PACU with chest x-ray pending   PATIENT DISPOSITION:  PACU - hemodynamically stable  PROCEDURE DETAILS: The patient was taken to the operating placed supine position where the area of the right and left neck were prepped in usual sterile fashion. SonoSite ultrasound was used to visualize the right internal jugular vein which was widely patent. Using local anesthesia an 18-gauge needle under ultrasound visualization the right internal jugular vein was accessed the guidewire was passed down to the level right atrium this was confirmed with fluoroscopy. A dilator and peel-away sheath was passed over the guidewire and the peel-away sheath dilator was removed. The 23 cm catheter was passed through the peel-away sheath which was removed as well. The catheter was brought through subcutaneous tunnel through a separate stab incision. A 2 lm ports were attached in both lumens flushed and aspirated easily and were locked with 1000 unit per cc heparin. The catheter was secured to the skin with a 3-0 nylon stitch and the entry site was closed with 4 subcuticular Vicryl stitch. A fluoroscopy confirmed position the distal right atrium and no evidence of kinking. The patient was transferred to the recovery room where chest x-ray is pending   Curt Jews, M.D. 08/28/2013 11:29 AM

## 2013-08-28 NOTE — Progress Notes (Signed)
[facility] TEAM 1 - Stepdown/ICU TEAM Progress Note  Scott [name] JFH:[id number] DOB: 1941/01/20 DOA: 08/23/2013 PCP: Bufford Buttner, MD  Admit HPI / Brief Narrative: 73 y.o. male with history of multiple myeloma receiving chemotherapy every Tuesday (last tx day bfr admit), chronic kidney disease, anemia, and s/p pacemaker placement who had been experiencing diarrhea for one week. Patient had been having at least 4-5 episodes of watery diarrhea which was dark in color. Denied abdominal pain or nausea/vomiting. Patient became short of breath and also noticed swelling in his lower extremities.  In the ER at [facility] the patient's chest x-ray showed vascular congestion. Patient was found to be hypotensive and creatinine was found to be increased from baseline (as per our records patient's creatinine was around 3.5 last March). Patient was transferred to [facility] due to his worsening renal function. Patient has an AV fistula in the right arm and was originally to followup with vascular surgeon next week for patency.   HPI/Subjective: Pt states his diarrhea has resolved. He denies sob, or cp.  He does admit to some ongoing nausea.    Assessment/Plan:   C diff colitis Cont current tx and follow - improving nicely   Acute renal failure on chronic kidney disease Baseline crt ~3.5 - likely related to ischemic ATN in setting of volume depletion and hypotension - care as per Nephrology - crt is very slowly improving, but BUN is not signif improved and pt beginning to display uremic sx - now s/p HD cath w/ first tx planned for today   Metabolic acidosis Due to renal failure + profuse diarrhea - on bicarb IV for now - for HD today   Multiple myeloma  Agree w/ Nephrology that next tx (due Tues) will need to be held in face of acute severe infxn  Multifactorial chronic anemia Follow Hgb trend - no evidence of acute blood loss  Severe Hypokalemia Due to GI losses + poor intake, and now  IV bicarb - HD should help stabilize today    Thrombocytopenia No evidence of bleeding - follow trend   DM CBG well controlled at this time   Code Status: FULL Family Communication: spoke w/ pt and wife at bedside   Disposition Plan: SDU  Consultants: Nephrology   Procedures: 6/11 - B LE venous dopplers - no evidence of DVT, superficial thrombosis, or Baker's Cyst 6/11 - TTE - EF 55-60% - no WMA - grade 3 DD  6/15 - RIJ hemodialysis catheter  Antibiotics: Acyclovir 6/11 >> Flagyl 6/11 >> Vanc 6/11 >>  DVT prophylaxis: SCDs  Objective: Blood pressure 126/53, pulse 59, temperature 97.9 F (36.6 C), temperature source Oral, resp. rate 19, height 5' 5"  (1.651 m), weight 72.6 kg (160 lb 0.9 oz), SpO2 90.00%.  Intake/Output Summary (Last 24 hours) at 08/28/13 1422 Last data filed at 08/28/13 1141  Gross per 24 hour  Intake 1203.33 ml  Output    625 ml  Net 578.33 ml   Exam: General: No acute respiratory distress  Chest:  R chest wall HD cath - dressing intact and dry  Lungs: Clear to auscultation bilaterally without wheezes or crackles Cardiovascular: Regular rate without murmur gallop or rub Abdomen: Nontender, nondistended, soft, bowel sounds positive, no rebound, no ascites, no appreciable mass Extremities: No significant cyanosis, clubbing, or edema bilateral lower extremities  Data Reviewed: Basic Metabolic Panel:  Recent Labs Lab 08/23/13 2000 08/24/13 0209 08/24/13 1337 08/24/13 1900 08/25/13 0245 08/25/13 1615 08/26/13 0248 08/27/13 0242  NA 139  142 144  --  142 144 143 143  K 3.2* 2.4* 2.5* 3.2* 2.7* 3.5* 3.8 3.9  CL 98 100 104  --  104 106 105 103  CO2 17* 16* 16*  --  16* 18* 19 23  GLUCOSE 185* 207* 194*  --  101* 98 92 142*  BUN 107* 113* 117*  --  112* 111* 105* 101*  CREATININE 5.52* 5.58* 5.73*  --  5.59* 5.43* 5.29* 4.74*  CALCIUM 8.7 8.8 8.8  --  8.6 8.4 8.6 8.1*  MG  --  2.5  --   --   --   --   --   --   PHOS 7.3*  --   --   --   --    --   --   --    Liver Function Tests:  Recent Labs Lab 08/23/13 2000  AST 14  ALT 8  ALKPHOS 54  BILITOT 0.3  PROT 5.9*  ALBUMIN 2.6*   CBC:  Recent Labs Lab 08/23/13 2000  08/24/13 0209 08/24/13 0548 08/25/13 0245 08/26/13 0248 08/27/13 0242  WBC 12.0*  < > 11.3* 9.3 8.9 7.7 15.1*  NEUTROABS 11.2*  --   --   --   --   --   --   HGB 8.8*  < > 8.7* 8.8* 9.0* 8.7* 8.5*  HCT 26.2*  < > 25.8* 25.7* 26.9* 26.2* 25.4*  MCV 87.0  < > 87.5 86.0 86.8 87.9 88.5  PLT 71*  < > 68* 65* 67* 64* 78*  < > = values in this interval not displayed.  BNP (last 3 results)  Recent Labs  06/08/13 2145  PROBNP 16467.0*   CBG:  Recent Labs Lab 08/27/13 1659 08/27/13 2203 08/28/13 0223 08/28/13 0802 08/28/13 1143  GLUCAP 115* 108* 87 85 81    Recent Results (from the past 240 hour(s))  MRSA PCR SCREENING     Status: None   Collection Time    08/23/13  6:41 PM      Result Value Ref Range Status   MRSA by PCR NEGATIVE  NEGATIVE Final   Comment:            The GeneXpert MRSA Assay (FDA     approved for NASAL specimens     only), is one component of a     comprehensive MRSA colonization     surveillance program. It is not     intended to diagnose MRSA     infection nor to guide or     monitor treatment for     MRSA infections.  CLOSTRIDIUM DIFFICILE BY PCR     Status: Abnormal   Collection Time    08/23/13 10:36 PM      Result Value Ref Range Status   C difficile by pcr POSITIVE (*) NEGATIVE Final   Comment: CRITICAL RESULT CALLED TO, READ BACK BY AND VERIFIED WITH:     LINDSAY J RN 08/24/13 0740 COSTELLO B  STOOL CULTURE     Status: None   Collection Time    08/23/13 10:37 PM      Result Value Ref Range Status   Specimen Description STOOL   Final   Special Requests NONE   Final   Culture     Final   Value: NO SALMONELLA, SHIGELLA, CAMPYLOBACTER, YERSINIA, OR E.COLI 0157:H7 ISOLATED     Performed at [facility]   Report Status 08/27/2013 FINAL   Final  Studies:  Recent x-ray studies have been reviewed in detail by the Attending Physician  Scheduled Meds:  Scheduled Meds: . acyclovir  400 mg Oral Daily  . amiodarone  200 mg Oral Daily  . feeding supplement (RESOURCE BREEZE)  1 Container Oral TID BM  . ferrous sulfate  325 mg Oral Q breakfast  . insulin aspart  0-9 Units Subcutaneous TID WC  . megestrol  800 mg Oral Daily  . metronidazole  500 mg Intravenous Q8H  . sodium chloride  3 mL Intravenous Q12H  . tamsulosin  0.4 mg Oral QPC breakfast  . vancomycin  125 mg Oral QID    Time spent on care of this patient: 35 mins   Shoji Pertuit T , MD   [facility] Office  [phone] Pager - Text Page per Shea Evans as per below:  On-Call/Text Page:      [email or web address]      password [email or web address]  If 7PM-7AM, please contact night-coverage [email or web address] Password [email or web address] 08/28/2013, 2:22 PM   LOS: 5 days

## 2013-08-28 NOTE — Progress Notes (Signed)
I have personally attended this patient's dialysis session.   HD via new right TDC placed today and is receiving first treatment 3 hours/BFR 200/2K2.25 Ca bath BP 122/57 tolerating well thus far CLIP process started  Scott [name] B

## 2013-08-28 NOTE — Anesthesia Preprocedure Evaluation (Addendum)
Anesthesia Evaluation  Patient identified by MRN, date of birth, ID band Patient awake    Reviewed: Allergy & Precautions, H&P , NPO status , Patient's Chart, lab work & pertinent test results, reviewed documented beta blocker date and time   Airway Mallampati: II TM Distance: >3 FB Neck ROM: full    Dental   Pulmonary shortness of breath and with exertion, pneumonia -, resolved, Current Smoker,  breath sounds clear to auscultation        Cardiovascular + pacemaker Rhythm:regular     Neuro/Psych  Headaches, negative psych ROS   GI/Hepatic negative GI ROS, Neg liver ROS,   Endo/Other  diabetes, Insulin Dependent  Renal/GU ESRF and DialysisRenal disease  negative genitourinary   Musculoskeletal   Abdominal   Peds  Hematology negative hematology ROS (+) anemia ,   Anesthesia Other Findings See surgeon's H&P   Reproductive/Obstetrics negative OB ROS                          Anesthesia Physical Anesthesia Plan  ASA: IV  Anesthesia Plan: MAC   Post-op Pain Management:    Induction: Intravenous  Airway Management Planned: Simple Face Mask  Additional Equipment:   Intra-op Plan:   Post-operative Plan:   Informed Consent: I have reviewed the patients History and Physical, chart, labs and discussed the procedure including the risks, benefits and alternatives for the proposed anesthesia with the patient or authorized representative who has indicated his/her understanding and acceptance.   Dental advisory given  Plan Discussed with: Anesthesiologist, CRNA and Surgeon  Anesthesia Plan Comments:        Anesthesia Quick Evaluation

## 2013-08-28 NOTE — Interval H&P Note (Signed)
History and Physical Interval Note:  08/28/2013 10:18 AM  Scott [name]  has presented today for surgery, with the diagnosis of end stage renal disease  The various methods of treatment have been discussed with the patient and family. After consideration of risks, benefits and other options for treatment, the patient has consented to  Procedure(s): INSERTION OF DIALYSIS CATHETER (N/A) as a surgical intervention .  The patient's history has been reviewed, patient examined, no change in status, stable for surgery.  I have reviewed the patient's chart and labs.  Questions were answered to the patient's satisfaction.   Patient is for dialysis catheter today for acute hemodialysis. Does have poorly maturing fistula status post first stage brachiocephalic fistula. Will evaluate for long-term dialysis access  [name]

## 2013-08-29 DIAGNOSIS — I770 Arteriovenous fistula, acquired: Secondary | ICD-10-CM

## 2013-08-29 LAB — FERRITIN: Ferritin: 812 ng/mL — ABNORMAL HIGH (ref 22–322)

## 2013-08-29 LAB — GLUCOSE, CAPILLARY
GLUCOSE-CAPILLARY: 67 mg/dL — AB (ref 70–99)
Glucose-Capillary: 98 mg/dL (ref 70–99)

## 2013-08-29 LAB — RENAL FUNCTION PANEL
ALBUMIN: 2 g/dL — AB (ref 3.5–5.2)
BUN: 63 mg/dL — ABNORMAL HIGH (ref 6–23)
CO2: 27 mEq/L (ref 19–32)
CREATININE: 3.39 mg/dL — AB (ref 0.50–1.35)
Calcium: 7.9 mg/dL — ABNORMAL LOW (ref 8.4–10.5)
Chloride: 99 mEq/L (ref 96–112)
GFR calc Af Amer: 20 mL/min — ABNORMAL LOW (ref >90)
GFR, EST NON AFRICAN AMERICAN: 17 mL/min — AB (ref >90)
Glucose, Bld: 89 mg/dL (ref 70–99)
POTASSIUM: 3.8 meq/L (ref 3.7–5.3)
Phosphorus: 2.9 mg/dL (ref 2.3–4.6)
Sodium: 140 mEq/L (ref 137–147)

## 2013-08-29 LAB — CBC
HCT: 23.5 % — ABNORMAL LOW (ref 39.0–52.0)
Hemoglobin: 7.8 g/dL — ABNORMAL LOW (ref 13.0–17.0)
MCH: 29.1 pg (ref 26.0–34.0)
MCHC: 33.2 g/dL (ref 30.0–36.0)
MCV: 87.7 fL (ref 78.0–100.0)
Platelets: 77 10*3/uL — ABNORMAL LOW (ref 150–400)
RBC: 2.68 MIL/uL — ABNORMAL LOW (ref 4.22–5.81)
RDW: 16.3 % — ABNORMAL HIGH (ref 11.5–15.5)
WBC: 18.8 10*3/uL — ABNORMAL HIGH (ref 4.0–10.5)

## 2013-08-29 LAB — IRON AND TIBC: UIBC: 90 ug/dL — ABNORMAL LOW (ref 125–400)

## 2013-08-29 MED ORDER — PENTAFLUOROPROP-TETRAFLUOROETH EX AERO: 1.0000 "application " | INHALATION_SPRAY | CUTANEOUS | Status: DC | PRN

## 2013-08-29 MED ORDER — HEPARIN SODIUM (PORCINE) 1000 UNIT/ML DIALYSIS: 20.0000 [IU]/kg | INTRAMUSCULAR | Status: DC | PRN

## 2013-08-29 MED ORDER — NEPRO/CARBSTEADY PO LIQD
237.0000 mL | ORAL | Status: DC | PRN
  Filled 2013-08-29: qty 237

## 2013-08-29 MED ORDER — SODIUM CHLORIDE 0.9 % IV SOLN: 100.0000 mL | INTRAVENOUS | Status: DC | PRN

## 2013-08-29 MED ORDER — SODIUM CHLORIDE 0.9 % IV BOLUS (SEPSIS)
250.0000 mL | Freq: Once | INTRAVENOUS | Status: AC
  Administered 2013-08-29: 250 mL via INTRAVENOUS

## 2013-08-29 MED ORDER — LIDOCAINE HCL (PF) 1 % IJ SOLN: 5.0000 mL | INTRAMUSCULAR | Status: DC | PRN

## 2013-08-29 MED ORDER — HEPARIN SODIUM (PORCINE) 1000 UNIT/ML DIALYSIS: 1000.0000 [IU] | INTRAMUSCULAR | Status: DC | PRN

## 2013-08-29 MED ORDER — DARBEPOETIN ALFA-POLYSORBATE 60 MCG/0.3ML IJ SOLN
INTRAMUSCULAR | Status: AC
  Administered 2013-08-29: 60 ug
  Filled 2013-08-29: qty 0.3

## 2013-08-29 MED ORDER — LIDOCAINE-PRILOCAINE 2.5-2.5 % EX CREA
1.0000 "application " | TOPICAL_CREAM | CUTANEOUS | Status: DC | PRN
  Filled 2013-08-29: qty 5

## 2013-08-29 MED ORDER — ALTEPLASE 2 MG IJ SOLR
2.0000 mg | Freq: Once | INTRAMUSCULAR | Status: DC | PRN
  Filled 2013-08-29: qty 2

## 2013-08-29 MED ORDER — SODIUM CHLORIDE 0.9 % IV SOLN
125.0000 mg | INTRAVENOUS | Status: DC
  Administered 2013-08-31 – 2013-09-07 (×2): 125 mg via INTRAVENOUS
  Filled 2013-08-29 (×9): qty 10

## 2013-08-29 MED ORDER — METRONIDAZOLE 500 MG PO TABS
500.0000 mg | ORAL_TABLET | Freq: Three times a day (TID) | ORAL | Status: DC
  Administered 2013-08-29 – 2013-08-31 (×5): 500 mg via ORAL
  Filled 2013-08-29 (×9): qty 1

## 2013-08-29 NOTE — Progress Notes (Signed)
Admission note:  Arrival Method: bed Mental Orientation: alert and oriented x 4  Telemetry: no tele ordered  Assessment:  Skin:  IV: left FA saline locked  Pain: no Tubes:   Admission: Completed and admission orders have been written  6E Orientation: Patient has been oriented to the unit, staff and to the room.  Family: At the bedside

## 2013-08-29 NOTE — Progress Notes (Addendum)
[place] TEAM 1 - Stepdown/ICU TEAM Progress Note  COE ANGELOS HQI:[id number] DOB: 02-22-1941 DOA: 08/23/2013 PCP: Bufford Buttner, MD  Admit HPI / Brief Narrative: 73 y.o. male with history of multiple myeloma receiving chemotherapy every Tuesday (last tx day bfr admit), chronic kidney disease, anemia, and s/p pacemaker placement who had been experiencing diarrhea for one week. Patient had been having at least 4-5 episodes of watery diarrhea which was dark in color. Denied abdominal pain or nausea/vomiting. Patient became short of breath and also noticed swelling in his lower extremities.  In the ER at [facility] the patient's chest x-ray showed vascular congestion. Patient was found to be hypotensive and creatinine was found to be increased from baseline (as per our records patient's creatinine was around 3.5 last March). Patient was transferred to [facility] due to his worsening renal function. Patient has an AV fistula in the right arm and was originally to followup with Vascular Surgery next week for patency.   HPI/Subjective: Pt states his diarrhea has recurred. He denies sob, abdom pain, or cp.  He notes less nausea today.    Assessment/Plan:   C diff colitis Improving clinically - now that pt more hemodynamically stable, will transition to oral flagyl alone and follow clinically   Acute renal failure on chronic kidney disease Baseline crt ~3.5 - likely related to ischemic ATN in setting of volume depletion and hypotension - care as per Nephrology - now s/p HD #1 on 6/15, with another tx scheduled for today (cath issues limited time on 6/15)  HD vascular access Vasc Surgery evaluating for more permanent HD access options   Metabolic acidosis Due to renal failure + profuse diarrhea - on bicarb IV - HD ongoing    Multiple myeloma  Agree w/ Nephrology that next tx (due today) will need to be held in face of acute severe infxn  Multifactorial chronic anemia Follow Hgb  trend - no evidence of acute blood loss - labs confirm signif Fe deficiency - load IV per Nephrology   Severe Hypokalemia Due to GI losses + poor intake, and now IV bicarb - stabilizing nicely w/ HD    Thrombocytopenia No evidence of bleeding - follow trend   Hyperglycemia This is likely related to decadron use, and not true DM (no evidence of prior DM diagnosis on review of old records) - check A1c - CBG well controlled at this time   Code Status: FULL Family Communication: no family present at time of exam today  Disposition Plan: stable for transfer to medical bed   Consultants: Nephrology   Procedures: 6/11 - B LE venous dopplers - no evidence of DVT, superficial thrombosis, or Baker's Cyst 6/11 - TTE - EF 55-60% - no WMA - grade 3 DD  6/15 - RIJ hemodialysis catheter  Antibiotics: Acyclovir 6/11 >> Flagyl 6/11 >> Vanc 6/11 >>  DVT prophylaxis: SCDs  Objective: Blood pressure 128/48, pulse 59, temperature 97.9 F (36.6 C), temperature source Oral, resp. rate 16, height 5' 5"  (1.651 m), weight 73.3 kg (161 lb 9.6 oz), SpO2 96.00%.  Intake/Output Summary (Last 24 hours) at 08/29/13 1410 Last data filed at 08/29/13 0855  Gross per 24 hour  Intake 1199.51 ml  Output    559 ml  Net 640.51 ml   Exam: General: No acute respiratory distress  Chest:  R chest wall HD cath - dressing intact and dry  Lungs: Clear to auscultation bilaterally without wheezes or crackles Cardiovascular: Regular rate and rhythm without murmur gallop  or rub Abdomen: Nontender, nondistended, soft, bowel sounds positive, no rebound, no ascites, no appreciable mass Extremities: No significant cyanosis, clubbing, or edema bilateral lower extremities  Data Reviewed: Basic Metabolic Panel:  Recent Labs Lab 08/23/13 2000 08/24/13 0209  08/25/13 1615 08/26/13 0248 08/27/13 0242 08/28/13 1500 08/29/13 0314  NA 139 142  < > 144 143 143 144 140  K 3.2* 2.4*  < > 3.5* 3.8 3.9 3.2* 3.8  CL 98  100  < > 106 105 103 102 99  CO2 17* 16*  < > 18* 19 23 27 27   GLUCOSE 185* 207*  < > 98 92 142* 117* 89  BUN 107* 113*  < > 111* 105* 101* 96* 63*  CREATININE 5.52* 5.58*  < > 5.43* 5.29* 4.74* 4.53* 3.39*  CALCIUM 8.7 8.8  < > 8.4 8.6 8.1* 8.2* 7.9*  MG  --  2.5  --   --   --   --   --   --   PHOS 7.3*  --   --   --   --   --  3.5 2.9  < > = values in this interval not displayed.  Liver Function Tests:  Recent Labs Lab 08/23/13 2000 08/28/13 1500 08/29/13 0314  AST 14  --   --   ALT 8  --   --   ALKPHOS 54  --   --   BILITOT 0.3  --   --   PROT 5.9*  --   --   ALBUMIN 2.6* 2.1* 2.0*   CBC:  Recent Labs Lab 08/23/13 2000  08/25/13 0245 08/26/13 0248 08/27/13 0242 08/28/13 1500 08/29/13 0314  WBC 12.0*  < > 8.9 7.7 15.1* 20.1* 18.8*  NEUTROABS 11.2*  --   --   --   --   --   --   HGB 8.8*  < > 9.0* 8.7* 8.5* 7.9* 7.8*  HCT 26.2*  < > 26.9* 26.2* 25.4* 23.9* 23.5*  MCV 87.0  < > 86.8 87.9 88.5 88.8 87.7  PLT 71*  < > 67* 64* 78* 70* 77*  < > = values in this interval not displayed.  BNP (last 3 results)  Recent Labs  06/08/13 2145  PROBNP 16467.0*   CBG:  Recent Labs Lab 08/28/13 1143 08/28/13 1841 08/28/13 2120 08/29/13 0803 08/29/13 1128  GLUCAP 81 111* 110* 67* 98    Recent Results (from the past 240 hour(s))  MRSA PCR SCREENING     Status: None   Collection Time    08/23/13  6:41 PM      Result Value Ref Range Status   MRSA by PCR NEGATIVE  NEGATIVE Final   Comment:            The GeneXpert MRSA Assay (FDA     approved for NASAL specimens     only), is one component of a     comprehensive MRSA colonization     surveillance program. It is not     intended to diagnose MRSA     infection nor to guide or     monitor treatment for     MRSA infections.  CLOSTRIDIUM DIFFICILE BY PCR     Status: Abnormal   Collection Time    08/23/13 10:36 PM      Result Value Ref Range Status   C difficile by pcr POSITIVE (*) NEGATIVE Final   Comment:  CRITICAL RESULT CALLED TO, READ BACK BY AND VERIFIED WITH:  Ria Comment J RN 08/24/13 0740 COSTELLO B  STOOL CULTURE     Status: None   Collection Time    08/23/13 10:37 PM      Result Value Ref Range Status   Specimen Description STOOL   Final   Special Requests NONE   Final   Culture     Final   Value: NO SALMONELLA, SHIGELLA, CAMPYLOBACTER, YERSINIA, OR E.COLI 0157:H7 ISOLATED     Performed at [facility]   Report Status 08/27/2013 FINAL   Final     Studies:  Recent x-ray studies have been reviewed in detail by the Attending Physician  Scheduled Meds:  Scheduled Meds: . acyclovir  400 mg Oral Daily  . amiodarone  200 mg Oral Daily  . darbepoetin (ARANESP) injection - DIALYSIS  60 mcg Intravenous Q Tue-HD  . feeding supplement (RESOURCE BREEZE)  1 Container Oral TID BM  . ferrous sulfate  325 mg Oral Q breakfast  . insulin aspart  0-9 Units Subcutaneous TID WC  . megestrol  800 mg Oral Daily  . metroNIDAZOLE  500 mg Oral 3 times per day  . sodium chloride  3 mL Intravenous Q12H  . tamsulosin  0.4 mg Oral QPC breakfast  . vancomycin  125 mg Oral QID    Time spent on care of this patient: 35 mins   MCCLUNG,JEFFREY T , MD   [facility] Office  [phone] Pager - Text Page per Shea Evans as per below:  On-Call/Text Page:      [email or web address]      password [email or web address]  If 7PM-7AM, please contact night-coverage [email or web address] Password [email or web address] 08/29/2013, 2:10 PM   LOS: 6 days

## 2013-08-29 NOTE — Progress Notes (Signed)
VASCULAR LAB PRELIMINARY  PRELIMINARY  PRELIMINARY  PRELIMINARY  Right upper extremity AVF duplex:    Basilic vein AVF Depth  mm Diameter mm  At anastomosis 4.57 4.16  Above AC 5.7 4.24  Branch measures 4.36  Mid upper arm 11.5 5.62  Upper upper arm 14.3 5.53      Right  Upper Extremity Vein Map    Cephalic  Segment Diameter Depth Comment  1. Axilla 2.19 mm mm   2. Mid upper arm 2.49 mm mm   3. Above AC 2.49 mm mm   4. In AC 3.78 mm mm   5. Below AC 2.41 mm mm   6. Mid forearm 2.67 mm mm   7. Wrist 2.49 mm mm    mm mm    mm mm    mm mm     [name], Scott 08/29/2013, 11:01 AM

## 2013-08-29 NOTE — Procedures (Signed)
I have personally attended this patient's dialysis session.   2K bath Try for 1 liter off TDC running at 300 SBP around 90 To start Fe load for unmeasurable iron   DUNHAM,CYNTHIA B

## 2013-08-29 NOTE — Progress Notes (Signed)
Hypoglycemic Event  CBG: 67  Treatment: 15 GM carbohydrate snack  Symptoms: Shaky  Follow-up CBG: Time:0820 CBG Result:  Possible Reasons for Event: Change in activity  Comments/MD notified:Will continue to monitor.     Wonda Cerise D  Remember to initiate Hypoglycemia Order Set & complete

## 2013-08-29 NOTE — Progress Notes (Signed)
Assessment/Plan:  73yo WM with PMH sig for multiple myeloma (currently chemotherapy on 08/22/13), DM, HTN, advanced CKD stage 4 (baseline Scr 3.3-3.9) who was admitted on 08/23/13 with a 3 day h/o diarrhea, and noted to be hypotensive, and in respiratory distress after he was given IV bolus at [facility]. He was subsequently found to be C. Diff + so is being treated for that. Transferred from [facility] for management of AKI on CKD  in setting of volume depletion, hypotension, and C. Diff colitis in pt with advanced CKD and multiple myeloma. BUN over 100, creatinine 5.6 Poor UOP  1. AKI/CKD/new ESRD- likely related to ischemic ATN in setting of volume depletion and hypotension. Needs dialysis due to uremic symptoms. Advanced CKD at baseline (3.5 creatinine) New ESRD.  Had first HD yesterday but catheter poorly functional. Second HD today. CLIP process started. 2. C. Diff colitis- on po vanco 3. Multiple myeloma- recently finished a cycle of chemo.Next cycle due but prob should be delayed with infx 4.   AV Fistula, inadequate - Dr. Donnetta Hutching indicates will re-evaluate for another long term access 5.  Anemia - aranesp 60 QTues started; iron load (start today) for fe <10, cannot calculate tsat Stop oral iron 6.  Bones - PTH pending  Subjective: Interval History:  No appetite.  New [facility] yesterday Getting second treatment today Feels "yucky" Coughing up green phlegm  Objective: Vital signs in last 24 hours: Temp:  [97.9 F (36.6 C)-99.1 F (37.3 C)] 97.9 F (36.6 C) (06/16 1139) Pulse Rate:  [57-80] 60 (06/16 1435) Resp:  [12-20] 16 (06/16 1130) BP: (103-134)/(43-59) 103/59 mmHg (06/16 1435) SpO2:  [92 %-100 %] 100 % (06/16 1435) Weight:  [73.3 kg (161 lb 9.6 oz)] 73.3 kg (161 lb 9.6 oz) (06/16 0428) Weight change: -0.6 kg (-1 lb 5.2 oz)  Intake/Output from previous day: 06/15 0701 - 06/16 0700 In: 1962.8 [I.V.:1662.8; IV Piggyback:300] Out: 709 [Urine:300] Intake/Output this  shift: Total I/O In: 340 [P.O.:240; IV Piggyback:100] Out: -   General appearance: alert BP 103/59  Pulse 60  Temp(Src) 97.9 F (36.6 C) (Oral)  Resp 16  Ht 5' 5"  (1.651 m)  Wt 73.3 kg (161 lb 9.6 oz)  BMI 26.89 kg/m2  SpO2 100% New right TDC with dressing intact GI: soft, non-tender; bowel sounds normal; no masses,  no organomegaly No edema Moon facies Positive asterixis Green phlegm in vomit bag  Lab Results:  Recent Labs  08/28/13 1500 08/29/13 0314  WBC 20.1* 18.8*  HGB 7.9* 7.8*  HCT 23.9* 23.5*  PLT 70* 77*   Results for DEVRIN, MONFORTE (MRN [id number]) as of 08/29/2013 14:55  Ref. Range 08/29/2013 03:14  Iron Latest Range: 42-135 ug/dL <10 (L)  UIBC Latest Range: 125-400 ug/dL 90 (L)  TIBC Latest Range: 215-435 ug/dL Not calculated due to Iron <10.  Saturation Ratios Latest Range: 20-55 % Not calculated due to Iron <10.  Ferritin Latest Range: 22-322 ng/mL 812 (H)   BMET:   Recent Labs  08/28/13 1500 08/29/13 0314  NA 144 140  K 3.2* 3.8  CL 102 99  CO2 27 27  GLUCOSE 117* 89  BUN 96* 63*  CREATININE 4.53* 3.39*  CALCIUM 8.2* 7.9*   Studies/Results: Dg Chest 1 View  08/28/2013   CLINICAL DATA:  Central catheter placement  EXAM: CHEST - 1 VIEW  COMPARISON:  Study obtained earlier in the day  FINDINGS: Dual-lumen central catheter tip is at the cavoatrial junction. No pneumothorax. There is moderate generalized  interstitial edema with patchy alveolar edema in the bases. Heart is mildly enlarged with mild pulmonary venous hypertension. No adenopathy.  IMPRESSION: Central catheter tip at cavoatrial junction. No pneumothorax. Underlying congestive heart failure. Superimposed pneumonia in the bases cannot be excluded radiographically.   Electronically Signed   By: Lowella Grip M.D.   On: 08/28/2013 12:28   Dg Chest Port 1 View  08/28/2013   CLINICAL DATA:  Shortness of breath.  EXAM: PORTABLE CHEST - 1 VIEW  COMPARISON:  Chest radiograph from 08/23/2013   FINDINGS: The lungs are well-aerated. Bibasilar airspace opacification may reflect multifocal pneumonia or pulmonary edema. Small bilateral pleural effusions are suspected, though the costophrenic angles are incompletely imaged on this study. No pneumothorax is seen.  The cardiomediastinal silhouette is borderline normal in size. A pacemaker is noted overlying the left chest wall, with leads ending overlying the right atrium and right ventricle. No acute osseous abnormalities are seen.  IMPRESSION: Bibasilar airspace opacification may reflect multifocal pneumonia or pulmonary edema. Suspect small bilateral pleural effusions.   Electronically Signed   By: Garald Balding M.D.   On: 08/28/2013 03:58   Dg Fluoro Guide Cv Line-no Report  08/28/2013   CLINICAL DATA: insertion of dialysis catheter   FLOURO GUIDE CV LINE  Fluoroscopy was utilized by the requesting physician.  No radiographic  interpretation.     Scheduled: . acyclovir  400 mg Oral Daily  . amiodarone  200 mg Oral Daily  . darbepoetin (ARANESP) injection - DIALYSIS  60 mcg Intravenous Q Tue-HD  . feeding supplement (RESOURCE BREEZE)  1 Container Oral TID BM  . ferrous sulfate  325 mg Oral Q breakfast  . megestrol  800 mg Oral Daily  . metroNIDAZOLE  500 mg Oral 3 times per day  . sodium chloride  3 mL Intravenous Q12H  . tamsulosin  0.4 mg Oral QPC breakfast     LOS: 6 days   DUNHAM,CYNTHIA B 08/29/2013,2:51 PM

## 2013-08-30 ENCOUNTER — Encounter (HOSPITAL_COMMUNITY): Payer: Self-pay | Admitting: Vascular Surgery

## 2013-08-30 ENCOUNTER — Inpatient Hospital Stay (HOSPITAL_COMMUNITY): Payer: Medicare HMO

## 2013-08-30 DIAGNOSIS — A0472 Enterocolitis due to Clostridium difficile, not specified as recurrent: Secondary | ICD-10-CM

## 2013-08-30 DIAGNOSIS — D649 Anemia, unspecified: Secondary | ICD-10-CM

## 2013-08-30 DIAGNOSIS — N184 Chronic kidney disease, stage 4 (severe): Secondary | ICD-10-CM

## 2013-08-30 LAB — CBC
HEMATOCRIT: 24.4 % — AB (ref 39.0–52.0)
Hemoglobin: 7.9 g/dL — ABNORMAL LOW (ref 13.0–17.0)
MCH: 28.9 pg (ref 26.0–34.0)
MCHC: 32.4 g/dL (ref 30.0–36.0)
MCV: 89.4 fL (ref 78.0–100.0)
Platelets: 83 10*3/uL — ABNORMAL LOW (ref 150–400)
RBC: 2.73 MIL/uL — AB (ref 4.22–5.81)
RDW: 16.4 % — ABNORMAL HIGH (ref 11.5–15.5)
WBC: 20.9 10*3/uL — ABNORMAL HIGH (ref 4.0–10.5)

## 2013-08-30 LAB — GLUCOSE, CAPILLARY
GLUCOSE-CAPILLARY: 74 mg/dL (ref 70–99)
GLUCOSE-CAPILLARY: 95 mg/dL (ref 70–99)
Glucose-Capillary: 76 mg/dL (ref 70–99)
Glucose-Capillary: 115 mg/dL — ABNORMAL HIGH (ref 70–99)

## 2013-08-30 LAB — RENAL FUNCTION PANEL
Albumin: 2.3 g/dL — ABNORMAL LOW (ref 3.5–5.2)
BUN: 26 mg/dL — ABNORMAL HIGH (ref 6–23)
CO2: 28 mEq/L (ref 19–32)
Calcium: 8 mg/dL — ABNORMAL LOW (ref 8.4–10.5)
Chloride: 101 mEq/L (ref 96–112)
Creatinine, Ser: 2.39 mg/dL — ABNORMAL HIGH (ref 0.50–1.35)
GFR calc non Af Amer: 26 mL/min — ABNORMAL LOW (ref >90)
GFR, EST AFRICAN AMERICAN: 30 mL/min — AB (ref >90)
GLUCOSE: 77 mg/dL (ref 70–99)
Phosphorus: 2.1 mg/dL — ABNORMAL LOW (ref 2.3–4.6)
Potassium: 3.9 mEq/L (ref 3.7–5.3)
SODIUM: 142 meq/L (ref 137–147)

## 2013-08-30 LAB — PARATHYROID HORMONE, INTACT (NO CA): PTH: 166.5 pg/mL — AB (ref 14.0–72.0)

## 2013-08-30 MED ORDER — RENA-VITE PO TABS
1.0000 | ORAL_TABLET | Freq: Every day | ORAL | Status: DC
  Administered 2013-08-30 – 2013-09-07 (×8): 1 via ORAL
  Filled 2013-08-30 (×13): qty 1

## 2013-08-30 MED ORDER — NEPRO/CARBSTEADY PO LIQD
237.0000 mL | ORAL | Status: DC
  Administered 2013-08-30: 237 mL via ORAL
  Filled 2013-08-30 (×6): qty 237

## 2013-08-30 NOTE — Progress Notes (Signed)
Assessment/Plan:  73yo WM with PMH sig for multiple myeloma (currently chemotherapy on 08/22/13), DM, HTN, advanced CKD stage 4 (baseline Scr 3.3-3.9) who was admitted on 08/23/13 with a 3 day h/o diarrhea, and noted to be hypotensive, and in respiratory distress after he was given IV bolus at [facility]. He was subsequently found to be C. Diff + so is being treated for that. Transferred from [facility] for management of AKI on CKD  in setting of volume depletion, hypotension, and C. Diff colitis in pt with advanced CKD at baseline and multiple myeloma. BUN over 100, creatinine 5.6 Poor UOP. Dialysis initiated  1. AKI/CKD/new ESRD- likely related to ischemic ATN in setting of volume depletion and hypotension. Needs dialysis due to uremic symptoms. Advanced CKD at baseline (3.5 creatinine) New ESRD.  HD X 2 so far and feels better. Has TDC placed 6/15 Dr. Donnetta Hutching.  CLIP process started - has spot at [facility] TTS but cannot start until next Tuesday (gives time to assess ambulation, get off O2, etc) 2. Non-functional brachiocephalic AVF - await Dr. Luther Parody recs re new AVF 3. C. Diff colitis- on po flagyl and still having diarrhea 4. Multiple myeloma- recently finished a cycle of chemo.Next cycle due but prob should be delayed with infx 5.  Anemia - aranesp 60 QTues started; iron load with HD TTS for fe <10, cannot calculate tsat Stop oral iron 6.  Bones - PTH pending 7.  Progressive leukocytosis - WBC close to 21000   Cough with green sputum  CXR 8. Hypotension - last 48 hours or so BP's very soft.  Does not appear dry.  Stop tamsulosin.  May have to add midodrine.  Subjective: Interval History:  Still no appetite Food not good Has not been up walking Says he was SOB AFTER HD yesterday but is not today Cough with green sputum BP running soft   Objective: Vital signs in last 24 hours: Temp:  [97.4 F (36.3 C)-98.7 F (37.1 C)] 98.7 F (37.1 C) (06/17 0839) Pulse Rate:   [60-65] 61 (06/17 0839) Resp:  [18-20] 18 (06/17 0839) BP: (74-108)/(50-69) 74/58 mmHg (06/17 0839) SpO2:  [95 %-100 %] 99 % (06/17 0839) Weight:  [71.6 kg (157 lb 13.6 oz)-74.2 kg (163 lb 9.3 oz)] 74.2 kg (163 lb 9.3 oz) (06/17 0422) Weight change: 0.6 kg (1 lb 5.2 oz)  Intake/Output from previous day: 06/16 0701 - 06/17 0700 In: 1986.7 [P.O.:240; I.V.:1646.7; IV Piggyback:100] Out: 1100 [Urine:100] Intake/Output this shift:   BP 74/58  Pulse 61  Temp(Src) 98.7 F (37.1 C) (Oral)  Resp 18  Ht 5' 5"  (1.651 m)  Wt 74.2 kg (163 lb 9.3 oz)  BMI 27.22 kg/m2  SpO2 99% New right TDC with dressing intact/non-tender Lungs grossly clear  S1S2 No S3 GI: soft, non-tender; bowel sounds normal; no masses,  no organomegaly Trace edema   Lab Results:  Recent Labs  08/29/13 0314 08/30/13 0703  WBC 18.8* 20.9*  HGB 7.8* 7.9*  HCT 23.5* 24.4*  PLT 77* 83*   Results for DAVIEON, STOCKHAM (MRN [id number]) as of 08/29/2013 14:55  Ref. Range 08/29/2013 03:14  Iron Latest Range: 42-135 ug/dL <10 (L)  UIBC Latest Range: 125-400 ug/dL 90 (L)  TIBC Latest Range: 215-435 ug/dL Not calculated due to Iron <10.  Saturation Ratios Latest Range: 20-55 % Not calculated due to Iron <10.  Ferritin Latest Range: 22-322 ng/mL 812 (H)   BMET:   Recent Labs  08/29/13 0314 08/30/13 0703  NA 140 142  K 3.8 3.9  CL 99 101  CO2 27 28  GLUCOSE 89 77  BUN 63* 26*  CREATININE 3.39* 2.39*  CALCIUM 7.9* 8.0*   Studies/Results: No results found.  Scheduled: . acyclovir  400 mg Oral Daily  . amiodarone  200 mg Oral Daily  . darbepoetin (ARANESP) injection - DIALYSIS  60 mcg Intravenous Q Tue-HD  . feeding supplement (NEPRO CARB STEADY)  237 mL Oral Q24H  . [START ON 08/31/2013] ferric gluconate (FERRLECIT/NULECIT) IV  125 mg Intravenous Q T,Th,Sa-HD  . megestrol  800 mg Oral Daily  . metroNIDAZOLE  500 mg Oral 3 times per day  . sodium chloride  3 mL Intravenous Q12H  . tamsulosin  0.4 mg Oral QPC  breakfast     LOS: 7 days   Len Azeez B 08/30/2013,2:36 PM

## 2013-08-30 NOTE — Progress Notes (Signed)
Patient ID: Scott [name]  male  [id number]:[id number]    DOB: 02/01/1941    DOA: 08/23/2013  PCP: Bufford Buttner, MD  Interim summary  73 y.o. male with history of multiple myeloma receiving chemotherapy every Tuesday (last tx day bfr admit), chronic kidney disease, anemia, and s/p pacemaker placement who had been experiencing diarrhea for one week. Patient had been having at least 4-5 episodes of watery diarrhea which was dark in color. Denied abdominal pain or nausea/vomiting. Patient became short of breath and also noticed swelling in his lower extremities.  In the ER at [facility] the patient's chest x-ray showed vascular congestion. Patient was found to be hypotensive and creatinine was found to be increased from baseline (as per our records patient's creatinine was around 3.5 last March). Patient was transferred to [facility] due to his worsening renal function. Patient has an AV fistula in the right arm and was originally to followup with Vascular Surgery next week for patency   Assessment/Plan:  Principal Problem:  C. difficile colitis  Improving clinically, per patient "off and on", now that pt more hemodynamically stable, transitioned to oral flagyl alone and follow clinically   Active problems Acute renal failure on chronic kidney disease  Baseline crt ~3.5 - likely related to ischemic ATN in setting of volume depletion and hypotension - care as per Nephrology - now s/p HD #1 on 6/15, 6/16 - will await nephrology's recommendations for new hemodialysis, clip process  HD vascular access  Status post right IJ hemodialysis catheter, postop day #2, Brachiobasilic AVF 7/91/50  Vascular surgery following   Metabolic acidosis  Due to renal failure + profuse diarrhea - still on bicarb IV - HD ongoing,  - Bicarbonate 28 today   Multiple myeloma  - Per nephrology, recently finished chemotherapy cycle, next cycle due but should be delayed due to acute infection   Multifactorial  chronic anemia  no evidence of acute blood loss - labs confirm signif Fe deficiency - load IV per Nephrology   Severe Hypokalemia  Due to GI losses + poor intake, and now IV bicarb -improving  Thrombocytopenia  No evidence of bleeding - follow trend   DVT Prophylaxis:  Code Status:  Family Communication: Patient alert and oriented x4, discussed with patient  Disposition: Home soon once outpatient dialysis center approved  Consultants:  Nephrology  Vascular surgery  Procedures:  Hemodialysis 6/11 - B LE venous dopplers - no evidence of DVT, superficial thrombosis, or Baker's Cyst  6/11 - TTE - EF 55-60% - no WMA - grade 3 DD  6/15 - RIJ hemodialysis catheter   Antibiotics:  Flagyl oral    Subjective: Patient seen and examined, denies any specific complaints, diarrhea improving  Objective: Weight change: 0.6 kg (1 lb 5.2 oz)  Intake/Output Summary (Last 24 hours) at 08/30/13 1149 Last data filed at 08/30/13 0426  Gross per 24 hour  Intake 1646.67 ml  Output   1100 ml  Net 546.67 ml   Blood pressure 74/58, pulse 61, temperature 98.7 F (37.1 C), temperature source Oral, resp. rate 18, height 5' 5"  (1.651 m), weight 74.2 kg (163 lb 9.3 oz), SpO2 99.00%.  Physical Exam: General: Alert and awake, oriented x3, not in any acute distress. HEENT: anicteric sclera, PERLA, EOMI CVS: S1-S2 clear, no murmur rubs or gallops Chest: clear to auscultation bilaterally, no wheezing, rales or rhonchi Abdomen: soft nontender, nondistended, normal bowel sounds  Extremities: no cyanosis, clubbing or edema noted bilaterally Neuro: Cranial nerves II-XII intact, no  focal neurological deficits  Lab Results: Basic Metabolic Panel:  Recent Labs Lab 08/24/13 0209  08/29/13 0314 08/30/13 0703  NA 142  < > 140 142  K 2.4*  < > 3.8 3.9  CL 100  < > 99 101  CO2 16*  < > 27 28  GLUCOSE 207*  < > 89 77  BUN 113*  < > 63* 26*  CREATININE 5.58*  < > 3.39* 2.39*  CALCIUM 8.8  < >  7.9* 8.0*  MG 2.5  --   --   --   PHOS  --   < > 2.9 2.1*  < > = values in this interval not displayed. Liver Function Tests:  Recent Labs Lab 08/23/13 2000  08/29/13 0314 08/30/13 0703  AST 14  --   --   --   ALT 8  --   --   --   ALKPHOS 54  --   --   --   BILITOT 0.3  --   --   --   PROT 5.9*  --   --   --   ALBUMIN 2.6*  < > 2.0* 2.3*  < > = values in this interval not displayed. No results found for this basename: LIPASE, AMYLASE,  in the last 168 hours No results found for this basename: AMMONIA,  in the last 168 hours CBC:  Recent Labs Lab 08/23/13 2000  08/29/13 0314 08/30/13 0703  WBC 12.0*  < > 18.8* 20.9*  NEUTROABS 11.2*  --   --   --   HGB 8.8*  < > 7.8* 7.9*  HCT 26.2*  < > 23.5* 24.4*  MCV 87.0  < > 87.7 89.4  PLT 71*  < > 77* 83*  < > = values in this interval not displayed. Cardiac Enzymes: No results found for this basename: CKTOTAL, CKMB, CKMBINDEX, TROPONINI,  in the last 168 hours BNP: No components found with this basename: POCBNP,  CBG:  Recent Labs Lab 08/28/13 2120 08/29/13 0803 08/29/13 1128 08/30/13 0835 08/30/13 1145  GLUCAP 110* 67* 98 74 76     Micro Results: Recent Results (from the past 240 hour(s))  MRSA PCR SCREENING     Status: None   Collection Time    08/23/13  6:41 PM      Result Value Ref Range Status   MRSA by PCR NEGATIVE  NEGATIVE Final   Comment:            The GeneXpert MRSA Assay (FDA     approved for NASAL specimens     only), is one component of a     comprehensive MRSA colonization     surveillance program. It is not     intended to diagnose MRSA     infection nor to guide or     monitor treatment for     MRSA infections.  CLOSTRIDIUM DIFFICILE BY PCR     Status: Abnormal   Collection Time    08/23/13 10:36 PM      Result Value Ref Range Status   C difficile by pcr POSITIVE (*) NEGATIVE Final   Comment: CRITICAL RESULT CALLED TO, READ BACK BY AND VERIFIED WITH:     Larose Kells RN 08/24/13 0740  COSTELLO B  STOOL CULTURE     Status: None   Collection Time    08/23/13 10:37 PM      Result Value Ref Range Status   Specimen Description STOOL   Final  Special Requests NONE   Final   Culture     Final   Value: NO SALMONELLA, SHIGELLA, CAMPYLOBACTER, YERSINIA, OR E.COLI 0157:H7 ISOLATED     Performed at [facility]   Report Status 08/27/2013 FINAL   Final    Studies/Results: Dg Chest 1 View  08/28/2013   CLINICAL DATA:  Central catheter placement  EXAM: CHEST - 1 VIEW  COMPARISON:  Study obtained earlier in the day  FINDINGS: Dual-lumen central catheter tip is at the cavoatrial junction. No pneumothorax. There is moderate generalized interstitial edema with patchy alveolar edema in the bases. Heart is mildly enlarged with mild pulmonary venous hypertension. No adenopathy.  IMPRESSION: Central catheter tip at cavoatrial junction. No pneumothorax. Underlying congestive heart failure. Superimposed pneumonia in the bases cannot be excluded radiographically.   Electronically Signed   By: Lowella Grip M.D.   On: 08/28/2013 12:28   Dg Chest Port 1 View  08/28/2013   CLINICAL DATA:  Shortness of breath.  EXAM: PORTABLE CHEST - 1 VIEW  COMPARISON:  Chest radiograph from 08/23/2013  FINDINGS: The lungs are well-aerated. Bibasilar airspace opacification may reflect multifocal pneumonia or pulmonary edema. Small bilateral pleural effusions are suspected, though the costophrenic angles are incompletely imaged on this study. No pneumothorax is seen.  The cardiomediastinal silhouette is borderline normal in size. A pacemaker is noted overlying the left chest wall, with leads ending overlying the right atrium and right ventricle. No acute osseous abnormalities are seen.  IMPRESSION: Bibasilar airspace opacification may reflect multifocal pneumonia or pulmonary edema. Suspect small bilateral pleural effusions.   Electronically Signed   By: Garald Balding M.D.   On: 08/28/2013 03:58   Dg Chest  Port 1 View  08/24/2013   CLINICAL DATA:  Shortness of breath.  Infiltrates.  EXAM: PORTABLE CHEST - 1 VIEW  COMPARISON:  08/23/2013  FINDINGS: Two lead cardiac pacemaker without change in position. Cardiac enlargement with mild vascular congestion and interstitial edema. There seems to be mild progression since prior study. Atelectasis in the left lung base. No pneumothorax.  IMPRESSION: Cardiac enlargement with mild vascular congestion and interstitial edema, demonstrating some progression since prior study. Probable atelectasis in the left lung base.   Electronically Signed   By: Lucienne Capers M.D.   On: 08/24/2013 03:05   Dg Abd Portable 1v  08/24/2013   CLINICAL DATA:  Abdominal pain.  Blood in stools.  EXAM: PORTABLE ABDOMEN - 1 VIEW  COMPARISON:  CT abdomen and pelvis 12/1912.  FINDINGS: Examination is technically limited due to field of view. Portions of the abdomen and pelvis are not included. The bowel gas pattern is normal. No radio-opaque calculi or other significant radiographic abnormality are seen.  IMPRESSION: No evidence of bowel obstruction.   Electronically Signed   By: Lucienne Capers M.D.   On: 08/24/2013 03:06   Dg Fluoro Guide Cv Line-no Report  08/28/2013   CLINICAL DATA: insertion of dialysis catheter   FLOURO GUIDE CV LINE  Fluoroscopy was utilized by the requesting physician.  No radiographic  interpretation.     Medications: Scheduled Meds: . acyclovir  400 mg Oral Daily  . amiodarone  200 mg Oral Daily  . darbepoetin (ARANESP) injection - DIALYSIS  60 mcg Intravenous Q Tue-HD  . feeding supplement (RESOURCE BREEZE)  1 Container Oral TID BM  . [START ON 08/31/2013] ferric gluconate (FERRLECIT/NULECIT) IV  125 mg Intravenous Q T,Th,Sa-HD  . megestrol  800 mg Oral Daily  . metroNIDAZOLE  500 mg  Oral 3 times per day  . sodium chloride  3 mL Intravenous Q12H  . tamsulosin  0.4 mg Oral QPC breakfast      LOS: 7 days   RAI,RIPUDEEP M.D. [facility] 08/30/2013, 11:49 AM Pager: [phone]  If 7PM-7AM, please contact night-coverage [email or web address] Password [email or web address]  **Disclaimer: This note was dictated with voice recognition software. Similar sounding words can inadvertently be transcribed and this note may contain transcription errors which may not have been corrected upon publication of note.**

## 2013-08-30 NOTE — Progress Notes (Addendum)
[facility] Progress Note  08/30/2013 7:46 AM 2 Days Post-Op  Subjective:  Patient is having no complaints this morning. Asking when he can go home.    Filed Vitals:   08/30/13 0422  BP: 106/50  Pulse: 61  Temp: 98.4 F (36.9 C)  Resp: 18    Physical Exam: Neck: Right IJ cath in place Extremities:  Right upper arm AVF with audible bruit and palpable thrill. 2+ right radial pulse  CBC    Component Value Date/Time   WBC 18.8* 08/29/2013 0314   RBC 2.68* 08/29/2013 0314   RBC 3.06* 08/24/2013 1040   HGB 7.8* 08/29/2013 0314   HCT 23.5* 08/29/2013 0314   PLT 77* 08/29/2013 0314   MCV 87.7 08/29/2013 0314   MCH 29.1 08/29/2013 0314   MCHC 33.2 08/29/2013 0314   RDW 16.3* 08/29/2013 0314   LYMPHSABS 0.4* 08/23/2013 2000   MONOABS 0.4 08/23/2013 2000   EOSABS 0.0 08/23/2013 2000   BASOSABS 0.0 08/23/2013 2000    BMET    Component Value Date/Time   NA 140 08/29/2013 0314   K 3.8 08/29/2013 0314   CL 99 08/29/2013 0314   CO2 27 08/29/2013 0314   GLUCOSE 89 08/29/2013 0314   BUN 63* 08/29/2013 0314   CREATININE 3.39* 08/29/2013 0314   CALCIUM 7.9* 08/29/2013 0314   GFRNONAA 17* 08/29/2013 0314   GFRAA 20* 08/29/2013 0314    INR No results found for this basename: inr     Intake/Output Summary (Last 24 hours) at 08/30/13 0746 Last data filed at 08/30/13 0426  Gross per 24 hour  Intake 1986.67 ml  Output   1100 ml  Net 886.67 ml    Right upper extremity AVF duplex 11/27/76 Basilic vein AVF  Depth mm  Diameter mm   At anastomosis  4.57  4.16   Above AC  5.7  4.24 Branch measures 4.36   Mid upper arm  11.5  5.62   Upper upper arm  29.5  6.21    Cephalic  Segment  Diameter  Depth  Comment   1. Axilla  2.19 mm  mm    2. Mid upper arm  2.49 mm  mm    3. Above AC  2.49 mm  mm    4. In AC  3.78 mm  mm    5. Below AC  2.41 mm  mm    6. Mid forearm  2.67 mm  mm    7. Wrist  2.49 mm  mm       Assessment:  73 y.o. male is s/p:  Right IJ hemodialysis  catheter Brachiobasilic AVF 05/22/63  2 Days Post-Op  Plan: -Patent right upper arm AVF -Patient using right IJ catheter for dialysis     Virgina Jock, PA-C [facility] Office: [phone] Pager: [phone] 08/30/2013 7:46 AM     I have examined the patient, reviewed and agree with above. The patient's hemodialysis catheter is working adequately. His duplex yesterday showed that he does have moderate enlargement of his basilic vein. He did have a first stage of basilic vein anastomosis by Dr. Bridgett Larsson. The plan was to see him back in the office next week to schedule second stage. I discussed this with the patient. He is acceptable to proceed with the second stage of basilic vein mobilization whenever he is stable from a medical standpoint. This can be done during this admission or he can be readmitted. There is time available for the procedure  on Friday, 09/01/2013 if medically stable EARLY, TODD, MD 08/30/2013 2:34 PM

## 2013-08-30 NOTE — Progress Notes (Signed)
08/30/2013 4:11 PM Hemodialysis Outpatient Note; this patient has been accepted at the [facility] on a Tuesday, Thursday and Saturday 2nd shift schedule.The center can begin treatment on Tuesday June 23rd at 10:45AM. Thank you.Gordy Savers

## 2013-08-30 NOTE — Progress Notes (Signed)
NUTRITION FOLLOW-UP  DOCUMENTATION CODES Per approved criteria  -Severe malnutrition in the context of chronic illness   Pt meets criteria for severe MALNUTRITION in the context of chronic illness as evidenced by severe depletion of muscle mass with intake </= 75% of estimated energy requirement for >/= 1 month.  INTERVENTION: Discontinue Resource Breeze PO TID, each supplement provides 250 kcal and 9 grams of protein. Add Nepro Shake po daily, each supplement provides 425 kcal and 19 grams protein. RD to continue to follow nutrition care plan.  NUTRITION DIAGNOSIS: Malnutrition related to inadequate oral intake as evidenced by severe depletion of muscle mass with intake </= 75% of estimated energy requirement. Ongoing.  Goal: Intake to meet >90% of estimated nutrition needs.  Monitor:  PO intake, labs, weight trend, supplement tolerance  ASSESSMENT: 73 y.o. male with history of multiple myeloma and chemotherapy every Tuesday last one was yesterday, chronic kidney disease, anemia, pacemaker placement has been experiencing diarrhea for last one week. Patient has been having at least 4-5 episodes of watery diarrhea which was dark in color. Denies any abdominal pain or nausea vomiting. In the ER at [facility] patient's chest x-ray showed vascular congestion.  Dialysis catheter placed 6/15. Received first HD treatment 6/15. CLIP process initiated.  Pt reports that he is feeling better and eating a little better. Does not like [employer], would like it discontinued. Pt agreeable to trying Nepro. Discussed need for education, however pt reports that he would like for his wife to be present.  Renal diet with chopped meats.  Height: Ht Readings from Last 1 Encounters:  08/23/13 5' 5"  (1.651 m)    Weight: Wt Readings from Last 1 Encounters:  08/30/13 163 lb 9.3 oz (74.2 kg)  Admit wt 151 lb  BMI:  Body mass index is 27.22 kg/(m^2). Overweight  Estimated Nutritional  Needs: Kcal: 1800-2000 Protein: 90-100 gm Fluid: 1.2 L  Skin: stage 2 pressure ulcer to sacrum  Diet Order: Renal   Intake/Output Summary (Last 24 hours) at 08/30/13 1202 Last data filed at 08/30/13 0426  Gross per 24 hour  Intake 1646.67 ml  Output   1100 ml  Net 546.67 ml    Last BM: 6/16  Labs:   Recent Labs Lab 08/24/13 0209  08/28/13 1500 08/29/13 0314 08/30/13 0703  NA 142  < > 144 140 142  K 2.4*  < > 3.2* 3.8 3.9  CL 100  < > 102 99 101  CO2 16*  < > 27 27 28   BUN 113*  < > 96* 63* 26*  CREATININE 5.58*  < > 4.53* 3.39* 2.39*  CALCIUM 8.8  < > 8.2* 7.9* 8.0*  MG 2.5  --   --   --   --   PHOS  --   --  3.5 2.9 2.1*  GLUCOSE 207*  < > 117* 89 77  < > = values in this interval not displayed.  CBG (last 3)   Recent Labs  08/29/13 1128 08/30/13 0835 08/30/13 1145  GLUCAP 98 74 76    Scheduled Meds: . acyclovir  400 mg Oral Daily  . amiodarone  200 mg Oral Daily  . darbepoetin (ARANESP) injection - DIALYSIS  60 mcg Intravenous Q Tue-HD  . feeding supplement (RESOURCE BREEZE)  1 Container Oral TID BM  . [START ON 08/31/2013] ferric gluconate (FERRLECIT/NULECIT) IV  125 mg Intravenous Q T,Th,Sa-HD  . megestrol  800 mg Oral Daily  . metroNIDAZOLE  500 mg Oral 3 times  per day  . sodium chloride  3 mL Intravenous Q12H  . tamsulosin  0.4 mg Oral QPC breakfast    Continuous Infusions: .  sodium bicarbonate 150 mEq in sterile water 1000 mL infusion 50 mL/hr at 08/29/13 2303    Inda Coke MS, RD, LDN Inpatient Registered Dietitian Pager: [phone] After-hours pager: [phone]

## 2013-08-30 NOTE — Progress Notes (Signed)
Pt IV infiltrated.  Attempted to restart x 2 paged IV team.

## 2013-08-31 ENCOUNTER — Inpatient Hospital Stay (HOSPITAL_COMMUNITY): Payer: Medicare HMO

## 2013-08-31 LAB — RENAL FUNCTION PANEL
ALBUMIN: 2 g/dL — AB (ref 3.5–5.2)
BUN: 34 mg/dL — ABNORMAL HIGH (ref 6–23)
CO2: 32 mEq/L (ref 19–32)
Calcium: 7.9 mg/dL — ABNORMAL LOW (ref 8.4–10.5)
Chloride: 98 mEq/L (ref 96–112)
Creatinine, Ser: 3.35 mg/dL — ABNORMAL HIGH (ref 0.50–1.35)
GFR calc non Af Amer: 17 mL/min — ABNORMAL LOW (ref >90)
GFR, EST AFRICAN AMERICAN: 20 mL/min — AB (ref >90)
GLUCOSE: 87 mg/dL (ref 70–99)
PHOSPHORUS: 3 mg/dL (ref 2.3–4.6)
POTASSIUM: 3.3 meq/L — AB (ref 3.7–5.3)
Sodium: 140 mEq/L (ref 137–147)

## 2013-08-31 LAB — GLUCOSE, CAPILLARY
GLUCOSE-CAPILLARY: 72 mg/dL (ref 70–99)
GLUCOSE-CAPILLARY: 139 mg/dL — AB (ref 70–99)
Glucose-Capillary: 106 mg/dL — ABNORMAL HIGH (ref 70–99)
Glucose-Capillary: 84 mg/dL (ref 70–99)

## 2013-08-31 LAB — CBC
HCT: 23.2 % — ABNORMAL LOW (ref 39.0–52.0)
Hemoglobin: 7.7 g/dL — ABNORMAL LOW (ref 13.0–17.0)
MCH: 30.1 pg (ref 26.0–34.0)
MCHC: 33.2 g/dL (ref 30.0–36.0)
MCV: 90.6 fL (ref 78.0–100.0)
PLATELETS: 119 10*3/uL — AB (ref 150–400)
RBC: 2.56 MIL/uL — ABNORMAL LOW (ref 4.22–5.81)
RDW: 16.1 % — AB (ref 11.5–15.5)
WBC: 18.6 10*3/uL — ABNORMAL HIGH (ref 4.0–10.5)

## 2013-08-31 MED ORDER — VANCOMYCIN 50 MG/ML ORAL SOLUTION
125.0000 mg | Freq: Four times a day (QID) | ORAL | Status: DC
  Administered 2013-09-01: 125 mg via ORAL
  Filled 2013-08-31 (×9): qty 2.5

## 2013-08-31 MED ORDER — VANCOMYCIN HCL 1000 MG IV SOLR
1500.0000 mg | Freq: Once | INTRAVENOUS | Status: AC
  Administered 2013-09-01: 1500 mg via INTRAVENOUS
  Filled 2013-08-31 (×2): qty 1500

## 2013-08-31 MED ORDER — CHOLESTYRAMINE LIGHT 4 G PO PACK
4.0000 g | PACK | Freq: Two times a day (BID) | ORAL | Status: DC
  Filled 2013-08-31 (×2): qty 1

## 2013-08-31 MED ORDER — ALBUTEROL SULFATE (2.5 MG/3ML) 0.083% IN NEBU
5.0000 mg | INHALATION_SOLUTION | Freq: Once | RESPIRATORY_TRACT | Status: AC
  Administered 2013-08-31: 5 mg via RESPIRATORY_TRACT
  Filled 2013-08-31 (×2): qty 6

## 2013-08-31 MED ORDER — DEXTROSE 5 % IV SOLN
2.0000 g | INTRAVENOUS | Status: DC
  Administered 2013-08-31 – 2013-09-02 (×2): 2 g via INTRAVENOUS
  Filled 2013-08-31 (×4): qty 2

## 2013-08-31 MED ORDER — CHOLESTYRAMINE LIGHT 4 G PO PACK
4.0000 g | PACK | Freq: Two times a day (BID) | ORAL | Status: AC
  Filled 2013-08-31 (×2): qty 1

## 2013-08-31 MED ORDER — GUAIFENESIN-CODEINE 100-10 MG/5ML PO SOLN: 5.0000 mL | Freq: Four times a day (QID) | ORAL | Status: DC | PRN

## 2013-08-31 MED ORDER — IPRATROPIUM BROMIDE 0.02 % IN SOLN
0.5000 mg | Freq: Once | RESPIRATORY_TRACT | Status: AC
  Administered 2013-08-31: 0.5 mg via RESPIRATORY_TRACT
  Filled 2013-08-31: qty 2.5

## 2013-08-31 MED ORDER — VANCOMYCIN HCL IN DEXTROSE 750-5 MG/150ML-% IV SOLN
750.0000 mg | INTRAVENOUS | Status: DC
  Administered 2013-09-02: 750 mg via INTRAVENOUS
  Filled 2013-08-31: qty 150

## 2013-08-31 MED ORDER — CEFAZOLIN SODIUM 1-5 GM-% IV SOLN
1.0000 g | INTRAVENOUS | Status: DC
  Filled 2013-08-31: qty 50

## 2013-08-31 MED ORDER — BENZONATATE 100 MG PO CAPS
100.0000 mg | ORAL_CAPSULE | Freq: Three times a day (TID) | ORAL | Status: DC
  Administered 2013-09-01 – 2013-09-08 (×22): 100 mg via ORAL
  Filled 2013-08-31 (×27): qty 1

## 2013-08-31 NOTE — Progress Notes (Signed)
Report called to [facility] on 3S. Dorthey Sawyer, RN

## 2013-08-31 NOTE — Progress Notes (Signed)
Pt reports increasing shortness of breath.  O2 sats at 84% on 3 l/m per n/c.  Pt says he feel like he is smothering, which is how he reports he felt before he fell. Placed back on non-re breather mask at 15 l/m. Call to respiratory and rapid response for second assess.  Rapid response nurse, Wes, to the bedside. Page to K. Schorr with call back. New order for duo neb and repeat x-ray.  Pt reports he feels his breathing is now better than before. Remains on non re-breather mask at this time. Spouse at the bedside and aware.  Will continue to monitor. Dorthey Sawyer, RN

## 2013-08-31 NOTE — Progress Notes (Signed)
Pt placed on BiPAP at this time per Rapid Response RN. Pt tolerating settings for now; awaiting transport.

## 2013-08-31 NOTE — Progress Notes (Signed)
Patient ID: Scott [name]  male  HKV:[id number]    DOB: [birth year]    DOA: 08/23/2013  PCP: Bufford Buttner, MD  Interim summary  73 y.o. male with history of multiple myeloma receiving chemotherapy every Tuesday (last tx day bfr admit), chronic kidney disease, anemia, and s/p pacemaker placement who had been experiencing diarrhea for one week. Patient had been having at least 4-5 episodes of watery diarrhea which was dark in color. Denied abdominal pain or nausea/vomiting. Patient became short of breath and also noticed swelling in his lower extremities.  In the ER at [facility] the patient's chest x-ray showed vascular congestion. Patient was found to be hypotensive and creatinine was found to be increased from baseline (as per our records patient's creatinine was around 3.5 last March). Patient was transferred to [facility] due to his worsening renal function. Patient has an AV fistula in the right arm and was originally to followup with Vascular Surgery next week for patency   Assessment/Plan:  Principal Problem:  C. difficile colitis  -Patient reports still continuing to have diarrhea, patient did have improvement on oral vancomycin while he was in stepdown unit, I will discontinue Flagyl and place him back on oral vancomycin 125 mg oral qid.   Healthcare associated pneumonia/bilateral pneumonia chest x-ray/acute respiratory failure, leukocytosis - Still short of breath, on O2 supplementation, sats 94% on 3.5 L O2 via nasal cannula, does not use O2 at home -Chest x-ray showed bilateral pneumonia, will treat as HCAP on vancomycin and cefepime with HD  Acute renal failure on chronic kidney disease  Baseline crt ~3.5 - likely related to ischemic ATN in setting of volume depletion and hypotension - care as per Nephrology - now s/p HD #1 on 6/15, 6/16 - Patient approved for outpatient dialysis center to start on 6/23  HD vascular access  Status post right IJ hemodialysis catheter,  postop day #3, Brachiobasilic AVF 5/64/33  Vascular surgery following   Metabolic acidosis  Due to renal failure + profuse diarrhea - still on bicarb IV - HD ongoing,    Multiple myeloma  - Per nephrology, recently finished chemotherapy cycle, next cycle due but should be delayed due to acute infection   Multifactorial chronic anemia  no evidence of acute blood loss - labs confirm signif Fe deficiency - load IV per Nephrology   Severe Hypokalemia  Due to GI losses + poor intake, and now IV bicarb -improving  Thrombocytopenia  No evidence of bleeding - follow trend   DVT Prophylaxis:  Code Status:  Family Communication: Patient alert and oriented x4, discussed with patient and his wife at the bedside  Disposition: Likely home on the weekend  Consultants:  Nephrology  Vascular surgery  Procedures:  Hemodialysis 6/11 - B LE venous dopplers - no evidence of DVT, superficial thrombosis, or Baker's Cyst  6/11 - TTE - EF 55-60% - no WMA - grade 3 DD  6/15 - RIJ hemodialysis catheter   Antibiotics:  Flagyl oral discontinued this 6/18  IV vancomycin, IV cefepime 6/18>>  Oral vancomycin   Subjective: Patient seen and examined, continues to have diarrhea today, upset about the care, coughing greenish phlegm  Objective: Weight change: 0.8 kg (1 lb 12.2 oz)  Intake/Output Summary (Last 24 hours) at 08/31/13 1325 Last data filed at 08/31/13 0923  Gross per 24 hour  Intake    360 ml  Output    125 ml  Net    235 ml   Blood pressure 102/38, pulse  65, temperature 98.4 F (36.9 C), temperature source Oral, resp. rate 22, height 5' 5"  (1.651 m), weight 73.4 kg (161 lb 13.1 oz), SpO2 94.00%.  Physical Exam: General: Alert and awake, oriented x3, not in any acute distress. CVS: S1-S2 clear, no murmur rubs or gallops Chest: Decreased breath sound at the bases Abdomen: soft nontender, nondistended, normal bowel sounds  Extremities: no cyanosis, clubbing or edema noted  bilaterally Neuro: Cranial nerves II-XII intact, no focal neurological deficits  Lab Results: Basic Metabolic Panel:  Recent Labs Lab 08/29/13 0314 08/30/13 0703  NA 140 142  K 3.8 3.9  CL 99 101  CO2 27 28  GLUCOSE 89 77  BUN 63* 26*  CREATININE 3.39* 2.39*  CALCIUM 7.9* 8.0*  PHOS 2.9 2.1*   Liver Function Tests:  Recent Labs Lab 08/29/13 0314 08/30/13 0703  ALBUMIN 2.0* 2.3*   No results found for this basename: LIPASE, AMYLASE,  in the last 168 hours No results found for this basename: AMMONIA,  in the last 168 hours CBC:  Recent Labs Lab 08/29/13 0314 08/30/13 0703  WBC 18.8* 20.9*  HGB 7.8* 7.9*  HCT 23.5* 24.4*  MCV 87.7 89.4  PLT 77* 83*   Cardiac Enzymes: No results found for this basename: CKTOTAL, CKMB, CKMBINDEX, TROPONINI,  in the last 168 hours BNP: No components found with this basename: POCBNP,  CBG:  Recent Labs Lab 08/30/13 1145 08/30/13 1744 08/30/13 2247 08/31/13 0813 08/31/13 1227  GLUCAP 76 95 115* 106* 84     Micro Results: Recent Results (from the past 240 hour(s))  MRSA PCR SCREENING     Status: None   Collection Time    08/23/13  6:41 PM      Result Value Ref Range Status   MRSA by PCR NEGATIVE  NEGATIVE Final   Comment:            The GeneXpert MRSA Assay (FDA     approved for NASAL specimens     only), is one component of a     comprehensive MRSA colonization     surveillance program. It is not     intended to diagnose MRSA     infection nor to guide or     monitor treatment for     MRSA infections.  CLOSTRIDIUM DIFFICILE BY PCR     Status: Abnormal   Collection Time    08/23/13 10:36 PM      Result Value Ref Range Status   C difficile by pcr POSITIVE (*) NEGATIVE Final   Comment: CRITICAL RESULT CALLED TO, READ BACK BY AND VERIFIED WITH:     LINDSAY J RN 08/24/13 0740 COSTELLO B  STOOL CULTURE     Status: None   Collection Time    08/23/13 10:37 PM      Result Value Ref Range Status   Specimen  Description STOOL   Final   Special Requests NONE   Final   Culture     Final   Value: NO SALMONELLA, SHIGELLA, CAMPYLOBACTER, YERSINIA, OR E.COLI 0157:H7 ISOLATED     Performed at [facility]   Report Status 08/27/2013 FINAL   Final    Studies/Results: Dg Chest 1 View  08/28/2013   CLINICAL DATA:  Central catheter placement  EXAM: CHEST - 1 VIEW  COMPARISON:  Study obtained earlier in the day  FINDINGS: Dual-lumen central catheter tip is at the cavoatrial junction. No pneumothorax. There is moderate generalized interstitial edema with patchy alveolar edema in the  bases. Heart is mildly enlarged with mild pulmonary venous hypertension. No adenopathy.  IMPRESSION: Central catheter tip at cavoatrial junction. No pneumothorax. Underlying congestive heart failure. Superimposed pneumonia in the bases cannot be excluded radiographically.   Electronically Signed   By: Lowella Grip M.D.   On: 08/28/2013 12:28   Dg Chest Port 1 View  08/28/2013   CLINICAL DATA:  Shortness of breath.  EXAM: PORTABLE CHEST - 1 VIEW  COMPARISON:  Chest radiograph from 08/23/2013  FINDINGS: The lungs are well-aerated. Bibasilar airspace opacification may reflect multifocal pneumonia or pulmonary edema. Small bilateral pleural effusions are suspected, though the costophrenic angles are incompletely imaged on this study. No pneumothorax is seen.  The cardiomediastinal silhouette is borderline normal in size. A pacemaker is noted overlying the left chest wall, with leads ending overlying the right atrium and right ventricle. No acute osseous abnormalities are seen.  IMPRESSION: Bibasilar airspace opacification may reflect multifocal pneumonia or pulmonary edema. Suspect small bilateral pleural effusions.   Electronically Signed   By: Garald Balding M.D.   On: 08/28/2013 03:58   Dg Chest Port 1 View  08/24/2013   CLINICAL DATA:  Shortness of breath.  Infiltrates.  EXAM: PORTABLE CHEST - 1 VIEW  COMPARISON:  08/23/2013   FINDINGS: Two lead cardiac pacemaker without change in position. Cardiac enlargement with mild vascular congestion and interstitial edema. There seems to be mild progression since prior study. Atelectasis in the left lung base. No pneumothorax.  IMPRESSION: Cardiac enlargement with mild vascular congestion and interstitial edema, demonstrating some progression since prior study. Probable atelectasis in the left lung base.   Electronically Signed   By: Lucienne Capers M.D.   On: 08/24/2013 03:05   Dg Abd Portable 1v  08/24/2013   CLINICAL DATA:  Abdominal pain.  Blood in stools.  EXAM: PORTABLE ABDOMEN - 1 VIEW  COMPARISON:  CT abdomen and pelvis 12/1912.  FINDINGS: Examination is technically limited due to field of view. Portions of the abdomen and pelvis are not included. The bowel gas pattern is normal. No radio-opaque calculi or other significant radiographic abnormality are seen.  IMPRESSION: No evidence of bowel obstruction.   Electronically Signed   By: Lucienne Capers M.D.   On: 08/24/2013 03:06   Dg Fluoro Guide Cv Line-no Report  08/28/2013   CLINICAL DATA: insertion of dialysis catheter   FLOURO GUIDE CV LINE  Fluoroscopy was utilized by the requesting physician.  No radiographic  interpretation.     Medications: Scheduled Meds: . acyclovir  400 mg Oral Daily  . amiodarone  200 mg Oral Daily  . cholestyramine light  4 g Oral BID  . darbepoetin (ARANESP) injection - DIALYSIS  60 mcg Intravenous Q Tue-HD  . feeding supplement (NEPRO CARB STEADY)  237 mL Oral Q24H  . ferric gluconate (FERRLECIT/NULECIT) IV  125 mg Intravenous Q T,Th,Sa-HD  . megestrol  800 mg Oral Daily  . multivitamin  1 tablet Oral QHS  . sodium chloride  3 mL Intravenous Q12H  . vancomycin  125 mg Oral 4 times per day      LOS: 8 days   RAI,RIPUDEEP M.D. [facility] 08/31/2013, 1:25 PM Pager: [phone]  If 7PM-7AM, please contact night-coverage [email or web address] Password [email or web address]  **Disclaimer: This note  was dictated with voice recognition software. Similar sounding words can inadvertently be transcribed and this note may contain transcription errors which may not have been corrected upon publication of note.**

## 2013-08-31 NOTE — Progress Notes (Signed)
  [facility] Progress Note  08/31/2013 3:31 PM 3 Days Post-Op  Subjective:  Patient seen in HD. Had no complaints.    Filed Vitals:   08/31/13 1510  BP: 95/52  Pulse: 69  Temp:   Resp: 16    Physical Exam: Neck: right IJ catheter in place Extremities: palpable thrill right upper arm AVF; radial pulse palpable   CBC    Component Value Date/Time   WBC 18.6* 08/31/2013 1342   RBC 2.56* 08/31/2013 1342   RBC 3.06* 08/24/2013 1040   HGB 7.7* 08/31/2013 1342   HCT 23.2* 08/31/2013 1342   PLT 119* 08/31/2013 1342   MCV 90.6 08/31/2013 1342   MCH 30.1 08/31/2013 1342   MCHC 33.2 08/31/2013 1342   RDW 16.1* 08/31/2013 1342   LYMPHSABS 0.4* 08/23/2013 2000   MONOABS 0.4 08/23/2013 2000   EOSABS 0.0 08/23/2013 2000   BASOSABS 0.0 08/23/2013 2000    BMET    Component Value Date/Time   NA 140 08/31/2013 1342   K 3.3* 08/31/2013 1342   CL 98 08/31/2013 1342   CO2 32 08/31/2013 1342   GLUCOSE 87 08/31/2013 1342   BUN 34* 08/31/2013 1342   CREATININE 3.35* 08/31/2013 1342   CALCIUM 7.9* 08/31/2013 1342   GFRNONAA 17* 08/31/2013 1342   GFRAA 20* 08/31/2013 1342    INR No results found for this basename: inr     Intake/Output Summary (Last 24 hours) at 08/31/13 1531 Last data filed at 08/31/13 6433  Gross per 24 hour  Intake    240 ml  Output    125 ml  Net    115 ml     Assessment:  73 y.o. male is s/p:  Right IJ hemodialysis catheter 3 Days Post-Op  Right brachiobasilic AVF 2/95/18  Plan: Duplex yesterday showed moderate enlargement of his basilic vein. OR tomorrow for second stage basilic vein transposition. Discussed procedure, risks and benefits at length with patient. Was uncertain about surgery tomorrow but said he will be "on board" if discussed with his wife. I called his wife and she in agreement for surgery tomorrow. She will be by later this afternoon to speak with her husband. Consent order placed. NPO after midnight.    Virgina Jock,  PA-C [facility] Office: [phone] Pager: [phone] 08/31/2013 3:31 PM

## 2013-08-31 NOTE — Progress Notes (Signed)
ANTIBIOTIC CONSULT NOTE - INITIAL  Pharmacy Consult for Vancomycin and Cefepime Indication: rule out pneumonia (HCAP)  No Known Allergies  Patient Measurements: Height: 5' 5"  (165.1 cm) Weight: 161 lb 13.1 oz (73.4 kg) IBW/kg (Calculated) : 61.5  Vital Signs: Temp: 98.4 F (36.9 C) (06/18 0816) Temp src: Oral (06/18 0816) BP: 102/38 mmHg (06/18 0816) Pulse Rate: 65 (06/18 0816) Intake/Output from previous day: 06/17 0701 - 06/18 0700 In: 240 [P.O.:240] Out: 125 [Urine:125] Intake/Output from this shift: Total I/O In: 120 [P.O.:120] Out: -   Labs:  Recent Labs  08/28/13 1500 08/29/13 0314 08/30/13 0703  WBC 20.1* 18.8* 20.9*  HGB 7.9* 7.8* 7.9*  PLT 70* 77* 83*  CREATININE 4.53* 3.39* 2.39*   Estimated Creatinine Clearance: 24.7 ml/min (by C-G formula based on Cr of 2.39).   New start HD with sessions 6/15 and 6/16.  Plan for TTS ongoing HD schedule.  Microbiology: Recent Results (from the past 720 hour(s))  MRSA PCR SCREENING     Status: None   Collection Time    08/23/13  6:41 PM      Result Value Ref Range Status   MRSA by PCR NEGATIVE  NEGATIVE Final   Comment:            The GeneXpert MRSA Assay (FDA     approved for NASAL specimens     only), is one component of a     comprehensive MRSA colonization     surveillance program. It is not     intended to diagnose MRSA     infection nor to guide or     monitor treatment for     MRSA infections.  CLOSTRIDIUM DIFFICILE BY PCR     Status: Abnormal   Collection Time    08/23/13 10:36 PM      Result Value Ref Range Status   C difficile by pcr POSITIVE (*) NEGATIVE Final   Comment: CRITICAL RESULT CALLED TO, READ BACK BY AND VERIFIED WITH:     LINDSAY J RN 08/24/13 0740 COSTELLO B  STOOL CULTURE     Status: None   Collection Time    08/23/13 10:37 PM      Result Value Ref Range Status   Specimen Description STOOL   Final   Special Requests NONE   Final   Culture     Final   Value: NO SALMONELLA,  SHIGELLA, CAMPYLOBACTER, YERSINIA, OR E.COLI 0157:H7 ISOLATED     Performed at [facility]   Report Status 08/27/2013 FINAL   Final    Medical History: Past Medical History  Diagnosis Date  . Renal disorder   . Pacemaker   . Shortness of breath     "when I take one of my chemo pills" (03/17/2013)  . Protein calorie malnutrition   . Multiple myeloma   . Skin cancer of face     "once; left side of my face" (03/17/2013)  . Pneumonia 06/09/2013  . Diabetes mellitus without complication     no meds  . Arthritis     "little in my right shoulder" (03/17/2013)  . History of blood transfusion   . Daily headache     denies  . Cataract     Medications:  Scheduled:  . acyclovir  400 mg Oral Daily  . amiodarone  200 mg Oral Daily  . cholestyramine light  4 g Oral BID  . darbepoetin (ARANESP) injection - DIALYSIS  60 mcg Intravenous Q Tue-HD  . feeding supplement (  NEPRO CARB STEADY)  237 mL Oral Q24H  . ferric gluconate (FERRLECIT/NULECIT) IV  125 mg Intravenous Q T,Th,Sa-HD  . megestrol  800 mg Oral Daily  . multivitamin  1 tablet Oral QHS  . sodium chloride  3 mL Intravenous Q12H  . vancomycin  125 mg Oral 4 times per day   Assessment: 73 yo M admitted 6/10 with reports of watery diarrhea for the previous week.  Pt was found to have c.diff and is currently on PO Vancomycin.  Pt now has bilateral PNA on CXR and increased cough and sputum production.  MD has ordered Vancomycin and Cefepime for HCAP coverage.  New HD with some residual urine output (125 ml yesterday).  Pt has orders for HD today for 4 hour treatment with goal blood flow rate of 400 ml/min.  Goal of Therapy:  Renal dose adjustment of medications Vancomycin pre-HD level 15-25 mcg/ml  Plan:  Vancomycin 1500 mg IV x 1 dose (loading dose) after HD today. Vancomycin 750 mg IV with HD - TTS.  Will schedule to start 6/20. Cefepime 2gm IV after each HD session - first dose today.  [employer], Pharm.D.,  BCPS Clinical Pharmacist Pager [phone] 08/31/2013 1:35 PM

## 2013-08-31 NOTE — Progress Notes (Addendum)
Pt was found sitting in stool on the ride side of the bed on the floor sitting on his buttocks with L leg underneath him with pt spouse attempting to support his back.  Pt spouse reports that she was sitting on the bed with him when pt requested to lean to the left and leaned towards the foot of the bed to catch his breath.  She reports pt then slid his legs off to the right and requested that she "slide the recliner close" so he could sit on it.  Pt spouse reports she turned to grab it and pt slid off of the end of bed stating "Im pooping".  Pt capable of extending L leg from beneath him self.  Neither lower extremity was either externally or internally rotated.  Pt capable of flexion and extension to both lower extremities. Pt and spouse deny any head trauma. No visible trauma noted. Pt denies any complaints of pain.  Pt reports shortness of breath on transfer, placed on mask for comfort during cleaning and repositioning. Head of bed elevated to 90 degrees.  Pt reports feeling better and removed mask. Previously on O2 at 2 l/m via N/C as per home.  Educated pt spouse she will not be able to sit on the bed any further for the night.  Educated pt spouse not to attempt to assist pt in sitting on the side of the bed any further.  If patient so request only a staff member may assist him.  Pt spouse states she understands and will not attempt again.  Page to K. Schorr for notification with call back. Will continue to monitor. Dorthey Sawyer, RN

## 2013-08-31 NOTE — Progress Notes (Signed)
Pt transport on BiPAP with no complications. Pt stable for now on initial settings. RT will continue to monitor for further orders.

## 2013-08-31 NOTE — Evaluation (Signed)
Physical Therapy Evaluation Patient Details Name: Scott [name] MRN: [id number] DOB: [birth year] Today's Date: 08/31/2013   History of Present Illness  Pt. admitted from [facility] with a 1 1/2 week history of diarrhea and AKI on CKD stage IV.  Pt. with hypotension, respiratory distress and (+) c.diff.  Now ESRD and new to HD.  Is scheduled to begin OP HD on Tuesday 6/23 per wife.    Clinical Impression  Pt. Presents to PT with a decline in his usual but limited functional level and will benefit from acute PT to address mobility and strength issues in preparation for eventual home and beginning OP HD.  I am highly concerned that pt. Is not mobilizing well enough at this point that he can tolerate getting to OP HD and tolerating it.  I have asked Sharon Seller to initiate a plan to have pt. OOB daily to try to improve his OOB tolerance.  Unless pt. Dramatically improves, he is likely to need LTACH before home with OP HD.  He and wife decline SNF and pt. Is not able to tolerate rigors of IP rehab at this time.  Will follow for progress and will update goals and recommendations accordingly.  I have discussed my concerns with pt./ wife and with CM and SW.      Follow Up Recommendations LTACH comment) (wife/pt. Refusing SNF; I don't  Believe pt. Can tolerate IP rehab    Equipment Recommendations  Other (comment);Hospital bed    Recommendations for Other Services       Precautions / Restrictions Precautions Precautions: Fall Precaution Comments: Pt. on contact precautions for c.diff Restrictions Weight Bearing Restrictions: No      Mobility  Bed Mobility Overal bed mobility: Needs Assistance Bed Mobility: Rolling;Sidelying to Sit Rolling: Min assist Sidelying to sit: Mod assist       General bed mobility comments: needed to use bedrail to assist self in rolling; mod assist to raise shoulders to sitting postion  Transfers Overall transfer level:  (to be assessed; pt. declined  today)                  Ambulation/Gait Ambulation/Gait assistance:  (to be assessed; pt. declined today due to weakness)              Stairs            Wheelchair Mobility    Modified Rankin (Stroke Patients Only)       Balance Overall balance assessment: Needs assistance Sitting-balance support: Bilateral upper extremity supported;Feet supported Sitting balance-Leahy Scale: Fair                                       Pertinent Vitals/Pain See vitals tab Pt. Becomes SOB with little exertion , dyspnea is 3/4.  Needs head of bed elevated for ease of breathing    Home Living Family/patient expects to be discharged to:: Private residence Living Arrangements: Spouse/significant other Available Help at Discharge: Family;Available 24 hours/day Type of Home: Mobile home Home Access: Stairs to enter Entrance Stairs-Rails: Right;Left;Can reach both Entrance Stairs-Number of Steps: 4 Home Layout: One level Home Equipment: Walker - 2 wheels;Wheelchair - manual;Bedside commode      Prior Function Level of Independence: Needs assistance   Gait / Transfers Assistance Needed: Wife reports pt. was able to ambulate the length of the home with 2 standing rests as recently as 2 months ago.  Recently he has needed assist for transfers due to weakness from diarrhea. and assist for cleaning following BMs  ADL's / Homemaking Assistance Needed: pt. reports he takes a sponge bath        Hand Dominance        Extremity/Trunk Assessment   Upper Extremity Assessment: Generalized weakness           Lower Extremity Assessment: Generalized weakness         Communication   Communication: No difficulties  Cognition Arousal/Alertness: Awake/alert Behavior During Therapy: WFL for tasks assessed/performed Overall Cognitive Status: Within Functional Limits for tasks assessed                      General Comments General comments (skin  integrity, edema, etc.): edema present in (L) UE    Exercises        Assessment/Plan    PT Assessment Patient needs continued PT services  PT Diagnosis Abnormality of gait;Generalized weakness;Difficulty walking   PT Problem List Decreased strength;Decreased activity tolerance;Decreased balance;Decreased mobility;Decreased knowledge of use of DME;Cardiopulmonary status limiting activity  PT Treatment Interventions DME instruction;Gait training;Stair training;Functional mobility training;Therapeutic activities;Therapeutic exercise;Balance training;Patient/family education   PT Goals (Current goals can be found in the Care Plan section) Acute Rehab PT Goals Patient Stated Goal: home and to go to OP HD on Tuesday PT Goal Formulation: With patient Time For Goal Achievement: 09/14/13 Potential to Achieve Goals: Fair    Frequency Min 3X/week   Barriers to discharge Decreased caregiver support Wife is available and has been caring for pt. at home.  He is now weaker and was limited to sitting up at EOB with mod assist.  I am concerned that he will not be ready to be managed at OP HD from a mobility standpoint.  Has 4 steps to enter home and is not at this time able to negotiate steps.  I have advised wife to check into haveing a ramp built. she says finances are a big barrier for them.     Co-evaluation               End of Session   Activity Tolerance: Patient limited by fatigue;Treatment limited secondary to medical complications (Comment);Other (comment) (limited by SOB) Patient left: in bed;with call bell/phone within reach;with bed alarm set Nurse Communication: Mobility status;Other (comment) (need for nursing staff to have pt. OOB daily for OP HD prep)         Time: 0254-2706 PT Time Calculation (min): 31 min   Charges:   PT Evaluation $Initial PT Evaluation Tier I: 1 Procedure PT Treatments $Therapeutic Activity: 8-22 mins   PT G Codes:          Ladona Ridgel 08/31/2013, 2:14 PM Gerlean Ren PT Acute Rehab Services [phone] [place] [phone]

## 2013-08-31 NOTE — Procedures (Signed)
I have personally attended this patient's dialysis session.   Initiating HD via [place]. Just getting started with treatment. For 1-2 L off as BP tols, on 4K bath     DUNHAM,CYNTHIA B

## 2013-08-31 NOTE — Significant Event (Addendum)
Rapid Response Event Note  Overview: Respiratory Distress     Event Type: Respiratory  Initial Focused Assessment: Difficulty breathing    Interventions: Provider was made aware of patients current condition.   Event Summary:   Called by RT to assist with care of patient with increase O2 demand and shortness of breath. Admission history was reviewed with unit RN. Patient is now on a non-rebreather mask with sats of 97%. Mild use of accessory muscles on expiration. Skin is warm and dry. Heart sound are regular. No RRT interventions are required at this time. Patient will require closure monitoring due to respiratory status. Orders received to administer a HHN and PCXR. We will continue to assist with care as needed.      At 22:45, patient was receiving the [facility] and experienced severe respiratory distress. Patient was place on Bipap for respiratory support. Patient is now awaiting evaluation by oncall provider.         Eino Farber [place]

## 2013-08-31 NOTE — Progress Notes (Signed)
Background 73yo WM with PMH sig for multiple myeloma (currently chemotherapy on 08/22/13), DM, HTN, advanced CKD stage 4 (baseline Scr 3.3-3.9) who was admitted on 08/23/13 with a 3 day h/o diarrhea, and noted to be hypotensive, and in respiratory distress after he was given IV bolus at [facility]. He was subsequently found to be C. Diff + so is being treated for that. Transferred from [facility] for management of AKI on CKD  in setting of volume depletion, hypotension, and C. Diff colitis in pt with advanced CKD at baseline and multiple myeloma. BUN over 100, creatinine 5.6 Poor UOP. Dialysis initiated  Assessment/Plan:  1. AKIon advancedCKD/new ESRD/CKD5- likely related to ischemic ATN in setting of volume depletion and hypotension. Needed dialysis due to uremic symptoms. Advanced CKD at baseline (3.5 creatinine) New ESRD. Had [facility] placed 6/15 Dr. Donnetta Hutching.  CLIP process started - has spot at [facility] TTS but cannot start until next Tuesday (gives time to assess ambulation, get off O2, etc) HD today 2. Non-functional brachiocephalic AVF - await Dr. Luther Parody recs re new AVF - I have called his office  3. C. Diff colitis- on po flagyl and still having diarrhea (was changed from vanco?) - incr stools - spoke with primary - they will change back to vanco 4. Bilat PNA on CXR; cough with purulent sputum; primary plans to treat as HCAP 5. Multiple myeloma- recently finished a cycle of chemo.Next cycle due but prob should be delayed with infx 5.  Anemia - aranesp 60 QTues started; iron load with HD TTS for fe <10, cannot calculate tsat Stop oral iron 6.  Bones - PTH 166 - within outpt goal range of 150-300 on no med at this time 7. Hypotension - last 48 hours or so BP's very soft.  Does not appear dry.  Stopped tamsulosin.  May have to add midodrine. 8. Weakness/deconditoning - OT/PT  Subjective: Interval History:  Still no appetite Having more diarrhea/loose stools and he/wife upset  because she is having to clean him up herself Too weak to walk alone and wife says "dead weight" Cough with green sputum persists with CXR bibasilar infiltrates BP running soft  Objective: Vital signs in last 24 hours: Temp:  [97.9 F (36.6 C)-98.5 F (36.9 C)] 98.4 F (36.9 C) (06/18 0816) Pulse Rate:  [62-68] 65 (06/18 0816) Resp:  [18-22] 22 (06/18 0816) BP: (102-112)/(38-66) 102/38 mmHg (06/18 0816) SpO2:  [94 %-99 %] 94 % (06/18 0816) Weight:  [73.4 kg (161 lb 13.1 oz)] 73.4 kg (161 lb 13.1 oz) (06/17 2046) Weight change: 0.8 kg (1 lb 12.2 oz)  Intake/Output from previous day: 06/17 0701 - 06/18 0700 In: 240 [P.O.:240] Out: 125 [Urine:125] Intake/Output this shift: Total I/O In: 120 [P.O.:120] Out: -  BP 102/38  Pulse 65  Temp(Src) 98.4 F (36.9 C) (Oral)  Resp 22  Ht _0  (1.651 m)  Wt 73.4 kg (161 lb 13.1 oz)  BMI 26.93 kg/m2  SpO2 94% Wearing O2 and currently breathing OK Wad of green/yellow sputum in a paper towel Right TDC with dressing intact/non-tender Lungs grossly clear anteriorly/reduced BS posteriorly S1S2 No S3 GI: soft, non-tender; bowel sounds normal; no masses,  no organomegaly Trace edema of LE's    Recent Labs  08/29/13 0314 08/30/13 0703  WBC 18.8* 20.9*  HGB 7.8* 7.9*  HCT 23.5* 24.4*  PLT 77* 83*   Results for Scott [name], Scott [name] (MRN [id number]) as of 08/29/2013 14:55  Ref. Range 08/29/2013 03:14  Iron Latest  Range: 42-135 ug/dL <10 (L)  UIBC Latest Range: 125-400 ug/dL 90 (L)  TIBC Latest Range: 215-435 ug/dL Not calculated due to Iron <10.  Saturation Ratios Latest Range: 20-55 % Not calculated due to Iron <10.  Ferritin Latest Range: 22-322 ng/mL 812 (H)   Results for Scott [name], Scott [name] (MRN [id number]) as of 08/31/2013 12:41  Ref. Range 08/29/2013 03:14  PTH Latest Range: 14.0-72.0 pg/mL 166.5 (H)   BMET:   Recent Labs  08/29/13 0314 08/30/13 0703  NA 140 142  K 3.8 3.9  CL 99 101  CO2 27 28  GLUCOSE 89 77  BUN 63* 26*   CREATININE 3.39* 2.39*  CALCIUM 7.9* 8.0*   Studies/Results: Dg Chest 2 View  08/30/2013   CLINICAL DATA:  Multiple myeloma and pneumonia.  EXAM: CHEST  2 VIEW  COMPARISON:  August 28, 2013 11:56 a.m.  FINDINGS: The heart size and mediastinal contours are stable. The heart size is enlarged. Cardiac pacemaker and dual lumen right central venous line are unchanged. There are consolidation of bilateral lung bases with small bilateral pleural effusions. There is mild central pulmonary vascular congestion. The visualized skeletal structures are stable.  IMPRESSION: Bibasilar pneumonias with small bilateral pleural effusions. Mild pulmonary vascular congestion.   Electronically Signed   By: Abelardo Diesel M.D.   On: 08/30/2013 17:28    Scheduled: . acyclovir  400 mg Oral Daily  . amiodarone  200 mg Oral Daily  . cholestyramine light  4 g Oral BID  . darbepoetin (ARANESP) injection - DIALYSIS  60 mcg Intravenous Q Tue-HD  . feeding supplement (NEPRO CARB STEADY)  237 mL Oral Q24H  . ferric gluconate (FERRLECIT/NULECIT) IV  125 mg Intravenous Q T,Th,Sa-HD  . megestrol  800 mg Oral Daily  . metroNIDAZOLE  500 mg Oral 3 times per day  . multivitamin  1 tablet Oral QHS  . sodium chloride  3 mL Intravenous Q12H     LOS: 8 days   DUNHAM,CYNTHIA B 08/31/2013,12:42 PM

## 2013-09-01 ENCOUNTER — Encounter (HOSPITAL_COMMUNITY): Payer: Self-pay | Admitting: Certified Registered Nurse Anesthetist

## 2013-09-01 ENCOUNTER — Inpatient Hospital Stay (HOSPITAL_COMMUNITY): Payer: Medicare HMO | Admitting: Certified Registered Nurse Anesthetist

## 2013-09-01 ENCOUNTER — Encounter (HOSPITAL_COMMUNITY)
Admission: AD | Disposition: A | Discharge status: Skilled Nursing Facility | Payer: Self-pay | Source: Other Acute Inpatient Hospital | Attending: Internal Medicine

## 2013-09-01 ENCOUNTER — Telehealth: Payer: Self-pay | Admitting: Vascular Surgery

## 2013-09-01 ENCOUNTER — Other Ambulatory Visit: Payer: Self-pay | Admitting: *Deleted

## 2013-09-01 ENCOUNTER — Encounter (HOSPITAL_COMMUNITY): Payer: Medicare HMO | Admitting: Certified Registered Nurse Anesthetist

## 2013-09-01 DIAGNOSIS — N186 End stage renal disease: Secondary | ICD-10-CM

## 2013-09-01 DIAGNOSIS — Z4931 Encounter for adequacy testing for hemodialysis: Secondary | ICD-10-CM

## 2013-09-01 DIAGNOSIS — E875 Hyperkalemia: Secondary | ICD-10-CM

## 2013-09-01 HISTORY — PX: BASCILIC VEIN TRANSPOSITION: SHX5742

## 2013-09-01 LAB — CBC
HCT: 22.7 % — ABNORMAL LOW (ref 39.0–52.0)
Hemoglobin: 7.3 g/dL — ABNORMAL LOW (ref 13.0–17.0)
MCH: 29.1 pg (ref 26.0–34.0)
MCHC: 32.2 g/dL (ref 30.0–36.0)
MCV: 90.4 fL (ref 78.0–100.0)
Platelets: 119 10*3/uL — ABNORMAL LOW (ref 150–400)
RBC: 2.51 MIL/uL — ABNORMAL LOW (ref 4.22–5.81)
RDW: 16.4 % — ABNORMAL HIGH (ref 11.5–15.5)
WBC: 24.9 10*3/uL — ABNORMAL HIGH (ref 4.0–10.5)

## 2013-09-01 LAB — RENAL FUNCTION PANEL
Albumin: 2 g/dL — ABNORMAL LOW (ref 3.5–5.2)
BUN: 12 mg/dL (ref 6–23)
CO2: 30 mEq/L (ref 19–32)
Calcium: 7.6 mg/dL — ABNORMAL LOW (ref 8.4–10.5)
Chloride: 102 mEq/L (ref 96–112)
Creatinine, Ser: 1.8 mg/dL — ABNORMAL HIGH (ref 0.50–1.35)
GFR calc Af Amer: 42 mL/min — ABNORMAL LOW (ref >90)
GFR calc non Af Amer: 36 mL/min — ABNORMAL LOW (ref >90)
Glucose, Bld: 105 mg/dL — ABNORMAL HIGH (ref 70–99)
Phosphorus: 2.6 mg/dL (ref 2.3–4.6)
Potassium: 4.1 mEq/L (ref 3.7–5.3)
Sodium: 142 mEq/L (ref 137–147)

## 2013-09-01 LAB — GLUCOSE, CAPILLARY
Glucose-Capillary: 83 mg/dL (ref 70–99)
Glucose-Capillary: 80 mg/dL (ref 70–99)
Glucose-Capillary: 103 mg/dL — ABNORMAL HIGH (ref 70–99)

## 2013-09-01 SURGERY — TRANSPOSITION, VEIN, BASILIC
Anesthesia: Monitor Anesthesia Care | Site: Arm Upper | Laterality: Right

## 2013-09-01 MED ORDER — MIDAZOLAM HCL 5 MG/5ML IJ SOLN
INTRAMUSCULAR | Status: DC | PRN
  Administered 2013-09-01 (×2): 1 mg via INTRAVENOUS

## 2013-09-01 MED ORDER — ROCURONIUM BROMIDE 50 MG/5ML IV SOLN
INTRAVENOUS | Status: AC
  Filled 2013-09-01: qty 1

## 2013-09-01 MED ORDER — 0.9 % SODIUM CHLORIDE (POUR BTL) OPTIME
TOPICAL | Status: DC | PRN
Start: 2013-09-01 — End: 2013-09-01
  Administered 2013-09-01: 1000 mL

## 2013-09-01 MED ORDER — FENTANYL CITRATE 0.05 MG/ML IJ SOLN
INTRAMUSCULAR | Status: AC
  Filled 2013-09-01: qty 5

## 2013-09-01 MED ORDER — LIDOCAINE HCL (CARDIAC) 20 MG/ML IV SOLN
INTRAVENOUS | Status: AC
  Filled 2013-09-01: qty 5

## 2013-09-01 MED ORDER — SODIUM CHLORIDE 0.9 % IR SOLN
Status: DC | PRN
  Administered 2013-09-01: 07:00:00

## 2013-09-01 MED ORDER — FENTANYL CITRATE 0.05 MG/ML IJ SOLN: 25.0000 ug | INTRAMUSCULAR | Status: DC | PRN

## 2013-09-01 MED ORDER — LIDOCAINE-EPINEPHRINE 0.5 %-1:200000 IJ SOLN
INTRAMUSCULAR | Status: AC
  Filled 2013-09-01: qty 1

## 2013-09-01 MED ORDER — VANCOMYCIN 50 MG/ML ORAL SOLUTION
250.0000 mg | Freq: Four times a day (QID) | ORAL | Status: DC
  Administered 2013-09-01 – 2013-09-02 (×3): 250 mg via ORAL
  Filled 2013-09-01 (×7): qty 5

## 2013-09-01 MED ORDER — TRAMADOL HCL 50 MG PO TABS
50.0000 mg | ORAL_TABLET | Freq: Four times a day (QID) | ORAL | Status: DC | PRN
  Administered 2013-09-02: 50 mg via ORAL
  Filled 2013-09-01: qty 1

## 2013-09-01 MED ORDER — SODIUM CHLORIDE 0.9 % IV BOLUS (SEPSIS)
250.0000 mL | Freq: Once | INTRAVENOUS | Status: AC
  Administered 2013-09-01: 250 mL via INTRAVENOUS

## 2013-09-01 MED ORDER — SUCCINYLCHOLINE CHLORIDE 20 MG/ML IJ SOLN
INTRAMUSCULAR | Status: AC
  Filled 2013-09-01: qty 1

## 2013-09-01 MED ORDER — IPRATROPIUM-ALBUTEROL 0.5-2.5 (3) MG/3ML IN SOLN
3.0000 mL | RESPIRATORY_TRACT | Status: DC | PRN
  Administered 2013-09-01: 3 mL via RESPIRATORY_TRACT
  Filled 2013-09-01: qty 3

## 2013-09-01 MED ORDER — PROPOFOL 10 MG/ML IV BOLUS
INTRAVENOUS | Status: AC
  Filled 2013-09-01: qty 20

## 2013-09-01 MED ORDER — SODIUM CHLORIDE 0.9 % IV SOLN
INTRAVENOUS | Status: DC | PRN
  Administered 2013-09-01: 07:00:00 via INTRAVENOUS

## 2013-09-01 MED ORDER — MIDODRINE HCL 5 MG PO TABS
10.0000 mg | ORAL_TABLET | Freq: Three times a day (TID) | ORAL | Status: DC
  Administered 2013-09-01 – 2013-09-08 (×20): 10 mg via ORAL
  Filled 2013-09-01 (×24): qty 2

## 2013-09-01 MED ORDER — ONDANSETRON HCL 4 MG/2ML IJ SOLN
INTRAMUSCULAR | Status: AC
  Filled 2013-09-01: qty 2

## 2013-09-01 MED ORDER — SODIUM CHLORIDE 0.9 % IJ SOLN
INTRAMUSCULAR | Status: AC
  Filled 2013-09-01: qty 10

## 2013-09-01 MED ORDER — EPHEDRINE SULFATE 50 MG/ML IJ SOLN
INTRAMUSCULAR | Status: AC
  Filled 2013-09-01: qty 1

## 2013-09-01 MED ORDER — IPRATROPIUM-ALBUTEROL 0.5-2.5 (3) MG/3ML IN SOLN
3.0000 mL | Freq: Three times a day (TID) | RESPIRATORY_TRACT | Status: DC
  Administered 2013-09-01: 3 mL via RESPIRATORY_TRACT
  Filled 2013-09-01: qty 3

## 2013-09-01 MED ORDER — PHENYLEPHRINE 40 MCG/ML (10ML) SYRINGE FOR IV PUSH (FOR BLOOD PRESSURE SUPPORT)
PREFILLED_SYRINGE | INTRAVENOUS | Status: AC
  Filled 2013-09-01: qty 10

## 2013-09-01 MED ORDER — PROPOFOL 10 MG/ML IV BOLUS
INTRAVENOUS | Status: DC | PRN
  Administered 2013-09-01: 30 mg via INTRAVENOUS

## 2013-09-01 MED ORDER — LIDOCAINE-EPINEPHRINE 0.5 %-1:200000 IJ SOLN
INTRAMUSCULAR | Status: DC | PRN
Start: 2013-09-01 — End: 2013-09-01
  Administered 2013-09-01: 50 mL

## 2013-09-01 MED ORDER — MIDAZOLAM HCL 2 MG/2ML IJ SOLN
INTRAMUSCULAR | Status: AC
  Filled 2013-09-01: qty 2

## 2013-09-01 SURGICAL SUPPLY — 32 items
BANDAGE GAUZE ELAST BULKY 4 IN (GAUZE/BANDAGES/DRESSINGS) ×3 IMPLANT
BENZOIN TINCTURE PRP APPL 2/3 (GAUZE/BANDAGES/DRESSINGS) ×3 IMPLANT
CANISTER SUCTION 2500CC (MISCELLANEOUS) ×3 IMPLANT
CLIP LIGATING EXTRA MED SLVR (CLIP) ×3 IMPLANT
CLIP LIGATING EXTRA SM BLUE (MISCELLANEOUS) ×3 IMPLANT
CLOSURE WOUND 1/2 X4 (GAUZE/BANDAGES/DRESSINGS) ×1
COVER PROBE W GEL 5X96 (DRAPES) ×3 IMPLANT
COVER SURGICAL LIGHT HANDLE (MISCELLANEOUS) ×3 IMPLANT
DECANTER SPIKE VIAL GLASS SM (MISCELLANEOUS) ×3 IMPLANT
ELECT REM PT RETURN 9FT ADLT (ELECTROSURGICAL) ×3
ELECTRODE REM PT RTRN 9FT ADLT (ELECTROSURGICAL) ×1 IMPLANT
GLOVE BIO SURGEON STRL SZ7 (GLOVE) ×3 IMPLANT
GLOVE BIOGEL PI IND STRL 7.0 (GLOVE) ×2 IMPLANT
GLOVE BIOGEL PI INDICATOR 7.0 (GLOVE) ×4
GLOVE SS BIOGEL STRL SZ 7.5 (GLOVE) ×1 IMPLANT
GLOVE SUPERSENSE BIOGEL SZ 7.5 (GLOVE) ×2
GLOVE SURG SS PI 7.0 STRL IVOR (GLOVE) ×6 IMPLANT
GOWN STRL REUS W/ TWL LRG LVL3 (GOWN DISPOSABLE) ×3 IMPLANT
GOWN STRL REUS W/TWL LRG LVL3 (GOWN DISPOSABLE) ×6
KIT BASIN OR (CUSTOM PROCEDURE TRAY) ×3 IMPLANT
KIT ROOM TURNOVER OR (KITS) ×3 IMPLANT
NS IRRIG 1000ML POUR BTL (IV SOLUTION) ×3 IMPLANT
PACK CV ACCESS (CUSTOM PROCEDURE TRAY) ×3 IMPLANT
PAD ARMBOARD 7.5X6 YLW CONV (MISCELLANEOUS) ×6 IMPLANT
SPONGE GAUZE 4X4 12PLY (GAUZE/BANDAGES/DRESSINGS) ×3 IMPLANT
STRIP CLOSURE SKIN 1/2X4 (GAUZE/BANDAGES/DRESSINGS) ×2 IMPLANT
SUT PROLENE 6 0 CC (SUTURE) ×3 IMPLANT
SUT SILK 2 0 SH (SUTURE) ×3 IMPLANT
SUT VIC AB 3-0 SH 27 (SUTURE) ×2
SUT VIC AB 3-0 SH 27X BRD (SUTURE) ×1 IMPLANT
TOWEL OR 17X26 10 PK STRL BLUE (TOWEL DISPOSABLE) ×3 IMPLANT
UNDERPAD 30X30 INCONTINENT (UNDERPADS AND DIAPERS) ×3 IMPLANT

## 2013-09-01 NOTE — Progress Notes (Signed)
Nursing B/P 90's/30's.  Notified T. Rogue Bussing.  Orders received.  Will continue to monitor pt.

## 2013-09-01 NOTE — Anesthesia Preprocedure Evaluation (Addendum)
Anesthesia Evaluation  Patient identified by MRN, date of birth, ID band Patient awake    Reviewed: Allergy & Precautions, H&P , NPO status , Patient's Chart, lab work & pertinent test results  Airway Mallampati: II TM Distance: >3 FB Neck ROM: Full    Dental  (+) Edentulous Upper, Missing, Dental Advisory Given   Pulmonary shortness of breath and with exertion, pneumonia -, Current Smoker,    + decreased breath sounds      Cardiovascular + pacemaker Rhythm:Regular Rate:Normal     Neuro/Psych  Headaches,    GI/Hepatic negative GI ROS, Neg liver ROS,   Endo/Other  diabetes, Type 2  Renal/GU ESRFRenal disease     Musculoskeletal   Abdominal   Peds  Hematology  (+) anemia ,   Anesthesia Other Findings   Reproductive/Obstetrics                         Anesthesia Physical Anesthesia Plan  ASA: IV  Anesthesia Plan: MAC   Post-op Pain Management:    Induction: Intravenous  Airway Management Planned: Simple Face Mask  Additional Equipment:   Intra-op Plan:   Post-operative Plan:   Informed Consent: I have reviewed the patients History and Physical, chart, labs and discussed the procedure including the risks, benefits and alternatives for the proposed anesthesia with the patient or authorized representative who has indicated his/her understanding and acceptance.   Dental advisory given  Plan Discussed with: CRNA, Anesthesiologist and Surgeon  Anesthesia Plan Comments:        Anesthesia Quick Evaluation

## 2013-09-01 NOTE — Interval H&P Note (Signed)
History and Physical Interval Note:  09/01/2013 6:58 AM  Scott [name]  has presented today for surgery, with the diagnosis of ESRD  The various methods of treatment have been discussed with the patient and family. After consideration of risks, benefits and other options for treatment, the patient has consented to  Procedure(s): BASILIC VEIN TRANSPOSITION - 2ND STAGE AVF CREATION (Right) as a surgical intervention .  The patient's history has been reviewed, patient examined, no change in status, stable for surgery.  I have reviewed the patient's chart and labs.  Questions were answered to the patient's satisfaction.     EARLY, TODD

## 2013-09-01 NOTE — Anesthesia Postprocedure Evaluation (Signed)
  Anesthesia Post-op Note  Patient: Scott [name]  Procedure(s) Performed: Procedure(s): BASILIC VEIN TRANSPOSITION - 2ND STAGE AVF CREATION (Right)  Patient Location: PACU  Anesthesia Type:MAC  Level of Consciousness: awake  Airway and Oxygen Therapy: Patient Spontanous Breathing  Post-op Pain: mild  Post-op Assessment: Post-op Vital signs reviewed  Post-op Vital Signs: Reviewed  Last Vitals:  Filed Vitals:   09/01/13 1015  BP: 111/71  Pulse: 65  Temp:   Resp: 24    Complications: No apparent anesthesia complications

## 2013-09-01 NOTE — Transfer of Care (Signed)
Immediate Anesthesia Transfer of Care Note  Patient: Scott [name]  Procedure(s) Performed: Procedure(s): BASILIC VEIN TRANSPOSITION - 2ND STAGE AVF CREATION (Right)  Patient Location: PACU  Anesthesia Type:MAC  Level of Consciousness: awake, alert , oriented and patient cooperative  Airway & Oxygen Therapy: Patient Spontanous Breathing and non-rebreather face mask  Post-op Assessment: Report given to PACU RN, Post -op Vital signs reviewed and stable and Patient moving all extremities X 4  Post vital signs: Reviewed and stable  Complications: No apparent anesthesia complications

## 2013-09-01 NOTE — Progress Notes (Signed)
Background 73yo WM with PMH sig for multiple myeloma (currently chemotherapy on 08/22/13), DM, HTN, advanced CKD stage 4 (baseline Scr 3.3-3.9) who was admitted on 08/23/13 with a 3 day h/o diarrhea, and noted to be hypotensive, and in respiratory distress after he was given IV bolus at [facility]. He was subsequently found to be C. Diff + so is being treated for that. Transferred from [facility] for management of AKI on CKD  in setting of volume depletion, hypotension, and C. Diff colitis in pt with advanced CKD at baseline and multiple myeloma. BUN over 100, creatinine 5.6 Poor UOP. Dialysis initiated  Assessment/Plan:  1. AKI on advancedCKD/ now with new ESRD/CKD5- likely related to ischemic ATN in setting of volume depletion and hypotension. Needed dialysis due to uremic symptoms. Advanced CKD at baseline (3.5 ) New ESRD. Had [facility] placed 6/15 Dr. Donnetta Hutching.  CLIP process started - has spot at [facility] TTS but cannot start until next Tuesday (gives time to assess ambulation, get off O2, etc) HD TTS 2. Inadequate brachiocephalic AVF - s/p second stage BVT today (6/19) Dr. Donnetta Hutching 3. C. Diff colitis- on po flagyl and was still having diarrhea (was changed from vanco?) - incr stools - spoke with primary - back on po vanco 4. Bilat PNA on CXR/HAP; cough with purulent sputum; primary plans to treat as HCAP - vanco/cefipime (6/18) 5. Multiple myeloma- recently finished a cycle of chemo 6/9. Next cycle due but should be delayed with infx 5.  Anemia - aranesp 60 QTues started; iron load with HD TTS for fe <10, cannot calculate tsat Stop oral iron 6.  Bones - PTH 166 - within outpt goal range of 150-300 on no med at this time 7. Hypotension - last 48 hours or so BP's very soft.  Does not appear dry.  Stopped tamsulosin.  Add midodrine to facilitate vol removal with HD 8. Weakness/deconditoning - OT/PT  Subjective: Interval History:  Unable to pull fluid with HD yesterday d/t low  BP's Moved to SDU last PM with dyspnea, low sats, CXR with edema, basilar infiltrates as per prev, and atelectasis Required BIPAP off now Coughing loose cough Actually went to OR today and had second stage of BVT AVF by Dr. Donnetta Hutching Continues to stool frequently  Vanco IV + cefipime added for HAP; flagyl changed back to po vanco - all yesterday  Objective: Vital signs in last 24 hours: Temp:  [97.5 F (36.4 C)-98.6 F (37 C)] 98 F (36.7 C) (06/19 1110) Pulse Rate:  [54-83] 83 (06/19 1110) Resp:  [16-25] 18 (06/19 1110) BP: (77-129)/(31-80) 100/54 mmHg (06/19 1110) SpO2:  [92 %-100 %] 97 % (06/19 1110) FiO2 (%):  [50 %-60 %] 50 % (06/19 0450) Weight:  [72.1 kg (158 lb 15.2 oz)-73.1 kg (161 lb 2.5 oz)] 73.1 kg (161 lb 2.5 oz) (06/19 0005) Weight change: -0.9 kg (-1 lb 15.7 oz)  Intake/Output from previous day: 06/18 0701 - 06/19 0700 In: 470 [P.O.:120; I.V.:350] Out: 400  Intake/Output this shift: Total I/O In: 350 [I.V.:350] Out: -  BP 100/54  Pulse 83  Temp(Src) 98 F (36.7 C) (Oral)  Resp 18  Ht _0  (1.651 m)  Wt 73.1 kg (161 lb 2.5 oz)  BMI 26.82 kg/m2  SpO2 97% Wearing O2 and currently breathing OK Coughing frequently with sputum production Right TDC with dressing intact/non-tender Lungs grossly clear anteriorly/reduced BS posteriorly with some base crackles S1S2 No S3 GI: soft, non-tender; bowel sounds normal; no masses,  no  organomegaly Trace edema of LE's    Recent Labs  08/31/13 1342 09/01/13 0400  WBC 18.6* 24.9*  HGB 7.7* 7.3*  HCT 23.2* 22.7*  PLT 119* 119*   Results for IZAAC, REISIG (MRN [id number]) as of 08/29/2013 14:55  Ref. Range 08/29/2013 03:14  Iron Latest Range: 42-135 ug/dL <10 (L)  UIBC Latest Range: 125-400 ug/dL 90 (L)  TIBC Latest Range: 215-435 ug/dL Not calculated due to Iron <10.  Saturation Ratios Latest Range: 20-55 % Not calculated due to Iron <10.  Ferritin Latest Range: 22-322 ng/mL 812 (H)   Results for CLEVESTER, HELZER  (MRN [id number]) as of 08/31/2013 12:41  Ref. Range 08/29/2013 03:14  PTH Latest Range: 14.0-72.0 pg/mL 166.5 (H)   BMET:   Recent Labs  08/31/13 1342 09/01/13 0400  NA 140 142  K 3.3* 4.1  CL 98 102  CO2 32 30  GLUCOSE 87 105*  BUN 34* 12  CREATININE 3.35* 1.80*  CALCIUM 7.9* 7.6*   Studies/Results: Dg Chest 2 View  08/30/2013   CLINICAL DATA:  Multiple myeloma and pneumonia.  EXAM: CHEST  2 VIEW  COMPARISON:  August 28, 2013 11:56 a.m.  FINDINGS: The heart size and mediastinal contours are stable. The heart size is enlarged. Cardiac pacemaker and dual lumen right central venous line are unchanged. There are consolidation of bilateral lung bases with small bilateral pleural effusions. There is mild central pulmonary vascular congestion. The visualized skeletal structures are stable.  IMPRESSION: Bibasilar pneumonias with small bilateral pleural effusions. Mild pulmonary vascular congestion.   Electronically Signed   By: Abelardo Diesel M.D.   On: 08/30/2013 17:28   Dg Chest Port 1 View  09/01/2013   CLINICAL DATA:  Cough.  Short of breath.  EXAM: PORTABLE CHEST - 1 VIEW  COMPARISON:  08/30/2013.  08/28/2013.  FINDINGS: Right IJ dialysis catheter is present, unchanged. Two lead left subclavian pacemaker leads are also unchanged there is moderate volume overload/ CHF. Dense retrocardiac opacity likely representing a combination of effusion, atelectasis and edema. Bilateral basilar airspace disease and atelectasis is present. The airspace disease is compatible with pulmonary edema.  When comparing to the prior chest radiograph of 08/28/2013, pulmonary edema is minimally improved.  IMPRESSION: Mildly improved volume overload/ CHF, still moderate. Prominent basilar opacity represents a combination of pleural effusions, atelectasis and airspace disease/edema  These results were called by telephone at the time of interpretation on 09/01/2013 at 12:57 AM to Dr. Chaney Malling , who verbally acknowledged  these results.   Electronically Signed   By: Dereck Ligas M.D.   On: 09/01/2013 00:57    Scheduled: . acyclovir  400 mg Oral Daily  . amiodarone  200 mg Oral Daily  . benzonatate  100 mg Oral TID  . ceFEPime (MAXIPIME) IV  2 g Intravenous Q T,Th,Sa-HD  . darbepoetin (ARANESP) injection - DIALYSIS  60 mcg Intravenous Q Tue-HD  . feeding supplement (NEPRO CARB STEADY)  237 mL Oral Q24H  . ferric gluconate (FERRLECIT/NULECIT) IV  125 mg Intravenous Q T,Th,Sa-HD  . megestrol  800 mg Oral Daily  . multivitamin  1 tablet Oral QHS  . sodium chloride  3 mL Intravenous Q12H  . vancomycin  125 mg Oral 4 times per day  . [START ON 09/02/2013] vancomycin  750 mg Intravenous Q T,Th,Sa-HD     LOS: 9 days   DUNHAM,CYNTHIA B 09/01/2013,1:03 PM

## 2013-09-01 NOTE — Anesthesia Postprocedure Evaluation (Signed)
  Anesthesia Post-op Note  Patient: Scott [name]  Procedure(s) Performed: Procedure(s): BASILIC VEIN TRANSPOSITION - 2ND STAGE AVF CREATION (Right)  Patient Location: PACU  Anesthesia Type:MAC  Level of Consciousness: awake  Airway and Oxygen Therapy: Patient Spontanous Breathing  Post-op Pain: mild  Post-op Assessment: Post-op Vital signs reviewed  Post-op Vital Signs: Reviewed  Last Vitals:  Filed Vitals:   09/01/13 0450  BP:   Pulse: 61  Temp:   Resp: 25    Complications: No apparent anesthesia complications

## 2013-09-01 NOTE — Telephone Encounter (Addendum)
Message copied by Gena Fray on Fri Sep 01, 2013  1:41 PM ------      Message from: Mena Goes      Created: Fri Sep 01, 2013 10:53 AM      Regarding: Schedule                   ----- Message -----         From: [name], MD         Sent: 09/01/2013   9:58 AM           To: [facility] Charge Pool            The need to see Mr. Scott [name] in the office in 4 weeks. Can have venous duplex as well that his fistula ------  09/01/13: lm for pt re 2 appointments, dpm

## 2013-09-01 NOTE — Progress Notes (Signed)
PT Cancellation Note  Patient Details Name: Scott [name] MRN: [id number] DOB: December 13, 1940   Cancelled Treatment:    Reason Eval/Treat Not Completed: Patient at procedure or test/unavailable, patient to the OR this am.   Duncan Dull 09/01/2013, 8:08 AM Alben Deeds, PT DPT  [phone]

## 2013-09-01 NOTE — Progress Notes (Signed)
Patient ID: Scott [name]  male  BTD:[id number]    DOB: 05/29/1940    DOA: 08/23/2013  PCP: Bufford Buttner, MD  Interim summary  73 y.o. male with history of multiple myeloma receiving chemotherapy every Tuesday (last tx day bfr admit), chronic kidney disease, anemia, and s/p pacemaker placement who had been experiencing diarrhea for one week. Patient had been having at least 4-5 episodes of watery diarrhea which was dark in color. Denied abdominal pain or nausea/vomiting. Patient became short of breath and also noticed swelling in his lower extremities.  In the ER at [facility] the patient's chest x-ray showed vascular congestion. Patient was found to be hypotensive and creatinine was found to be increased from baseline (as per our records patient's creatinine was around 3.5 last March). Patient was transferred to [facility] due to his worsening renal function.   Assessment/Plan:  Principal Problem:  Acute respiratory failure with Healthcare associated pneumonia/bilateral pneumonia chest x-ray/ leukocytosis - overnight events noted, off BiPAP currently, on O2 supplementation, sats 94% on 2.5 L O2 via nasal cannula, does not use O2 at home -Chest x-ray overnight had shown pulmonary edema, continue IV vancomycin and cefepime, renal service to decide if he needs hemodialysis today   C. difficile colitis  -Patient reports 2 BM since morning, Flagyl discontinued yesterday - Continue oral vancomycin, increase to $RemoveBef'250mg'eXtnSXwOzV$  qid, If no significant improvement in the diarrhea, will discuss with infectious disease    Acute renal failure on chronic kidney disease  Baseline crt ~3.5 - likely related to ischemic ATN in setting of volume depletion and hypotension - care as per Nephrology - now s/p HD #1 on 6/15, 6/16 - Patient approved for outpatient dialysis center to start on 6/23  HD vascular access  Status post right IJ hemodialysis catheter, postop day #3, Brachiobasilic AVF 03/21/24    Vascular surgery following, Second stage right upper arm basilic vein transposition 9/48 today    Metabolic acidosis  Due to renal failure + profuse diarrhea - still on bicarb IV - HD ongoing,    Multiple myeloma  - Per nephrology, recently finished chemotherapy cycle, next cycle due but should be delayed due to acute infection   Multifactorial chronic anemia  no evidence of acute blood loss - labs confirm signif Fe deficiency - load IV per Nephrology   Severe Hypokalemia  Due to GI losses + poor intake, and now IV bicarb -improving  Thrombocytopenia  No evidence of bleeding - follow trend   DVT Prophylaxis:  Code Status:  Family Communication: discussed with patient and his wife at the bedside  Disposition: cont to monitor in SDU today  Consultants:  Nephrology  Vascular surgery  Procedures:  Hemodialysis 6/11 - B LE venous dopplers - no evidence of DVT, superficial thrombosis, or Baker's Cyst  6/11 - TTE - EF 55-60% - no WMA - grade 3 DD  6/15 - RIJ hemodialysis catheter   Antibiotics:  Flagyl oral discontinued this 6/18  IV vancomycin, IV cefepime 6/18>>  Oral vancomycin   Subjective: Patient seen and examined after OR, overnight events noted, currently off BiPAP, per patient he was wheezing last night    Objective: Weight change: -0.9 kg (-1 lb 15.7 oz)  Intake/Output Summary (Last 24 hours) at 09/01/13 1308 Last data filed at 09/01/13 1300  Gross per 24 hour  Intake    700 ml  Output    400 ml  Net    300 ml   Blood pressure 100/54, pulse 83, temperature  87 F (36.7 C), temperature source Oral, resp. rate 18, height $RemoveBe'5\' 5"'PralTkOSS$  (1.651 m), weight 73.1 kg (161 lb 2.5 oz), SpO2 97.00%.  Physical Exam: General: Alert and awake, oriented x3, not in any acute distress. CVS: S1-S2 clear, no murmur rubs or gallops Chest: Decreased breath sound at the bases Abdomen: soft nontender, nondistended, normal bowel sounds  Extremities: no cyanosis, clubbing or  edema noted bilaterally   Lab Results: Basic Metabolic Panel:  Recent Labs Lab 08/31/13 1342 09/01/13 0400  NA 140 142  K 3.3* 4.1  CL 98 102  CO2 32 30  GLUCOSE 87 105*  BUN 34* 12  CREATININE 3.35* 1.80*  CALCIUM 7.9* 7.6*  PHOS 3.0 2.6   Liver Function Tests:  Recent Labs Lab 08/31/13 1342 09/01/13 0400  ALBUMIN 2.0* 2.0*   No results found for this basename: LIPASE, AMYLASE,  in the last 168 hours No results found for this basename: AMMONIA,  in the last 168 hours CBC:  Recent Labs Lab 08/31/13 1342 09/01/13 0400  WBC 18.6* 24.9*  HGB 7.7* 7.3*  HCT 23.2* 22.7*  MCV 90.6 90.4  PLT 119* 119*   Cardiac Enzymes: No results found for this basename: CKTOTAL, CKMB, CKMBINDEX, TROPONINI,  in the last 168 hours BNP: No components found with this basename: POCBNP,  CBG:  Recent Labs Lab 08/31/13 1227 08/31/13 1803 08/31/13 2159 09/01/13 1014 09/01/13 1137  GLUCAP 84 72 139* 83 80     Micro Results: Recent Results (from the past 240 hour(s))  MRSA PCR SCREENING     Status: None   Collection Time    08/23/13  6:41 PM      Result Value Ref Range Status   MRSA by PCR NEGATIVE  NEGATIVE Final   Comment:            The GeneXpert MRSA Assay (FDA     approved for NASAL specimens     only), is one component of a     comprehensive MRSA colonization     surveillance program. It is not     intended to diagnose MRSA     infection nor to guide or     monitor treatment for     MRSA infections.  CLOSTRIDIUM DIFFICILE BY PCR     Status: Abnormal   Collection Time    08/23/13 10:36 PM      Result Value Ref Range Status   C difficile by pcr POSITIVE (*) NEGATIVE Final   Comment: CRITICAL RESULT CALLED TO, READ BACK BY AND VERIFIED WITH:     LINDSAY J RN 08/24/13 0740 COSTELLO B  STOOL CULTURE     Status: None   Collection Time    08/23/13 10:37 PM      Result Value Ref Range Status   Specimen Description STOOL   Final   Special Requests NONE   Final    Culture     Final   Value: NO SALMONELLA, SHIGELLA, CAMPYLOBACTER, YERSINIA, OR E.COLI 0157:H7 ISOLATED     Performed at [facility]   Report Status 08/27/2013 FINAL   Final    Studies/Results: Dg Chest 1 View  08/28/2013   CLINICAL DATA:  Central catheter placement  EXAM: CHEST - 1 VIEW  COMPARISON:  Study obtained earlier in the day  FINDINGS: Dual-lumen central catheter tip is at the cavoatrial junction. No pneumothorax. There is moderate generalized interstitial edema with patchy alveolar edema in the bases. Heart is mildly enlarged with mild pulmonary venous hypertension.  No adenopathy.  IMPRESSION: Central catheter tip at cavoatrial junction. No pneumothorax. Underlying congestive heart failure. Superimposed pneumonia in the bases cannot be excluded radiographically.   Electronically Signed   By: Lowella Grip M.D.   On: 08/28/2013 12:28   Dg Chest Port 1 View  08/28/2013   CLINICAL DATA:  Shortness of breath.  EXAM: PORTABLE CHEST - 1 VIEW  COMPARISON:  Chest radiograph from 08/23/2013  FINDINGS: The lungs are well-aerated. Bibasilar airspace opacification may reflect multifocal pneumonia or pulmonary edema. Small bilateral pleural effusions are suspected, though the costophrenic angles are incompletely imaged on this study. No pneumothorax is seen.  The cardiomediastinal silhouette is borderline normal in size. A pacemaker is noted overlying the left chest wall, with leads ending overlying the right atrium and right ventricle. No acute osseous abnormalities are seen.  IMPRESSION: Bibasilar airspace opacification may reflect multifocal pneumonia or pulmonary edema. Suspect small bilateral pleural effusions.   Electronically Signed   By: Garald Balding M.D.   On: 08/28/2013 03:58   Dg Chest Port 1 View  08/24/2013   CLINICAL DATA:  Shortness of breath.  Infiltrates.  EXAM: PORTABLE CHEST - 1 VIEW  COMPARISON:  08/23/2013  FINDINGS: Two lead cardiac pacemaker without change in  position. Cardiac enlargement with mild vascular congestion and interstitial edema. There seems to be mild progression since prior study. Atelectasis in the left lung base. No pneumothorax.  IMPRESSION: Cardiac enlargement with mild vascular congestion and interstitial edema, demonstrating some progression since prior study. Probable atelectasis in the left lung base.   Electronically Signed   By: Lucienne Capers M.D.   On: 08/24/2013 03:05   Dg Abd Portable 1v  08/24/2013   CLINICAL DATA:  Abdominal pain.  Blood in stools.  EXAM: PORTABLE ABDOMEN - 1 VIEW  COMPARISON:  CT abdomen and pelvis 12/1912.  FINDINGS: Examination is technically limited due to field of view. Portions of the abdomen and pelvis are not included. The bowel gas pattern is normal. No radio-opaque calculi or other significant radiographic abnormality are seen.  IMPRESSION: No evidence of bowel obstruction.   Electronically Signed   By: Lucienne Capers M.D.   On: 08/24/2013 03:06   Dg Fluoro Guide Cv Line-no Report  08/28/2013   CLINICAL DATA: insertion of dialysis catheter   FLOURO GUIDE CV LINE  Fluoroscopy was utilized by the requesting physician.  No radiographic  interpretation.     Medications: Scheduled Meds: . acyclovir  400 mg Oral Daily  . amiodarone  200 mg Oral Daily  . benzonatate  100 mg Oral TID  . ceFEPime (MAXIPIME) IV  2 g Intravenous Q T,Th,Sa-HD  . darbepoetin (ARANESP) injection - DIALYSIS  60 mcg Intravenous Q Tue-HD  . feeding supplement (NEPRO CARB STEADY)  237 mL Oral Q24H  . ferric gluconate (FERRLECIT/NULECIT) IV  125 mg Intravenous Q T,Th,Sa-HD  . megestrol  800 mg Oral Daily  . multivitamin  1 tablet Oral QHS  . sodium chloride  3 mL Intravenous Q12H  . vancomycin  125 mg Oral 4 times per day  . [START ON 09/02/2013] vancomycin  750 mg Intravenous Q T,Th,Sa-HD      LOS: 9 days   RAI,RIPUDEEP M.D. [facility] 09/01/2013, 1:08 PM Pager: [phone]  If 7PM-7AM, please contact  night-coverage [email or web address] Password [email or web address]  **Disclaimer: This note was dictated with voice recognition software. Similar sounding words can inadvertently be transcribed and this note may contain transcription errors which may not have been corrected upon publication of  note.**

## 2013-09-01 NOTE — Progress Notes (Signed)
Pt transferred to unit [place]. Dorthey Sawyer, RN

## 2013-09-01 NOTE — Progress Notes (Signed)
Follow-up:  Notified by RN at approx 2230 that pt reporting increased SOB w/ decreased 02 sats. Pt placed on NRB and initially reported some improvement. Requested a neb as RN reports some wheezing as well. RR RN paged and responded to bedside. While pt receiving neb he continued to have increased WOB w/ use of accessory muscles (per RR RN). He was then reported to have become very agitated, restless, dropped his sats into the low 80's, pulled neb off and stated he felt like he was smothering. RR RN and RRT placed pt on Bi-PAP. At the time of my arrival to bedside pt on BiPAP and reporting feeling much better. He remained alert and oriented through-out event. PCXR c/w somewhat improved but persistent pulmonary edema.  Assessment/Plan: 1. Acute hypoxic respiratory failure: In clinical setting of pt w/ ARF and persistent pulmonary edema. HD has been initiated. A one time dose of Lasix considered but pt currently hypotensive at 88/58. Will continue BiPAP and continue to monitor closely in SDU.   Jeryl Columbia, NP-C [facility] Pager [phone]

## 2013-09-01 NOTE — H&P (View-Only) (Signed)
  [facility] Progress Note  08/31/2013 3:31 PM 3 Days Post-Op  Subjective:  Patient seen in HD. Had no complaints.    Filed Vitals:   08/31/13 1510  BP: 95/52  Pulse: 69  Temp:   Resp: 16    Physical Exam: Neck: right IJ catheter in place Extremities: palpable thrill right upper arm AVF; radial pulse palpable   CBC    Component Value Date/Time   WBC 18.6* 08/31/2013 1342   RBC 2.56* 08/31/2013 1342   RBC 3.06* 08/24/2013 1040   HGB 7.7* 08/31/2013 1342   HCT 23.2* 08/31/2013 1342   PLT 119* 08/31/2013 1342   MCV 90.6 08/31/2013 1342   MCH 30.1 08/31/2013 1342   MCHC 33.2 08/31/2013 1342   RDW 16.1* 08/31/2013 1342   LYMPHSABS 0.4* 08/23/2013 2000   MONOABS 0.4 08/23/2013 2000   EOSABS 0.0 08/23/2013 2000   BASOSABS 0.0 08/23/2013 2000    BMET    Component Value Date/Time   NA 140 08/31/2013 1342   K 3.3* 08/31/2013 1342   CL 98 08/31/2013 1342   CO2 32 08/31/2013 1342   GLUCOSE 87 08/31/2013 1342   BUN 34* 08/31/2013 1342   CREATININE 3.35* 08/31/2013 1342   CALCIUM 7.9* 08/31/2013 1342   GFRNONAA 17* 08/31/2013 1342   GFRAA 20* 08/31/2013 1342    INR No results found for this basename: inr     Intake/Output Summary (Last 24 hours) at 08/31/13 1531 Last data filed at 08/31/13 1638  Gross per 24 hour  Intake    240 ml  Output    125 ml  Net    115 ml     Assessment:  73 y.o. male is s/p:  Right IJ hemodialysis catheter 3 Days Post-Op  Right brachiobasilic AVF 4/53/64  Plan: Duplex yesterday showed moderate enlargement of his basilic vein. OR tomorrow for second stage basilic vein transposition. Discussed procedure, risks and benefits at length with patient. Was uncertain about surgery tomorrow but said he will be "on board" if discussed with his wife. I called his wife and she in agreement for surgery tomorrow. She will be by later this afternoon to speak with her husband. Consent order placed. NPO after midnight.    Virgina Jock,  PA-C [facility] Office: [phone] Pager: [phone] 08/31/2013 3:31 PM

## 2013-09-01 NOTE — Op Note (Signed)
    OPERATIVE REPORT  DATE OF SURGERY: 09/01/2013  PATIENT: Scott [name], 73 y.o. male MRN: [id number]  DOB: 04-12-1940  PRE-OPERATIVE DIAGNOSIS: End-stage renal disease  POST-OPERATIVE DIAGNOSIS:  Same  PROCEDURE: Second stage right upper arm basilic vein transposition  SURGEON:  Curt Jews, M.D.  PHYSICIAN ASSISTANT: Nurse  ANESTHESIA:  Local with sedation  EBL: Minimal ml     BLOOD ADMINISTERED: None  DRAINS: None  SPECIMEN: None  COUNTS CORRECT:  YES  PLAN OF CARE: PACU   PATIENT DISPOSITION:  PACU - hemodynamically stable  PROCEDURE DETAILS: The patient was taken to the operating room placed supine position where the area the right arm was prepped and draped in usual sterile fashion. SonoSite ultrasound was used to visualize and marked and area of the basilic vein. The patient had a first stage brachial artery to basilic vein anastomosis by Dr. Bridgett Larsson and April. He had a good dilatation of the vein. This using 3 incisions in the upper arm over the vein the vein was completely mobilized. Trigger branches were ligated with 30 in 2-0 silk ties and divided. The brachial artery to basilic vein anastomosis was exposed and the brachial artery was isolated above and below the anastomosis. Good caliber brachial artery. A tunnel was created from the level of the brachial artery at the antecubital space going very superficial and subcutaneous to the axillary incision. The brachial artery was occluded proximal to the old anastomosis and the old anastomosis was divided. The vein was mobilized through the tunnel. It had been marked to prevent twisting. The vein was gently dilated and was of excellent caliber. The vein was brought through the tunnel in the subcutaneous tissue down to the area of the antecubital space. The vein was cut to appropriate length and was sewn end-to-side to the brachial artery with a running 6-0 Prolene suture. Clamps removed and excellent thrill was noted. Wound  irrigated with saline. Hemostasis was obtained with electrocautery. The wounds were closed with 3-0 Vicryl in the subcutaneous and subcuticular tissue. Benzoin and Steri-Strips were applied   Curt Jews, M.D. 09/01/2013 9:56 AM

## 2013-09-02 DIAGNOSIS — R197 Diarrhea, unspecified: Secondary | ICD-10-CM

## 2013-09-02 DIAGNOSIS — J189 Pneumonia, unspecified organism: Secondary | ICD-10-CM

## 2013-09-02 DIAGNOSIS — C9000 Multiple myeloma not having achieved remission: Secondary | ICD-10-CM

## 2013-09-02 LAB — GLUCOSE, CAPILLARY
GLUCOSE-CAPILLARY: 114 mg/dL — AB (ref 70–99)
Glucose-Capillary: 97 mg/dL (ref 70–99)
Glucose-Capillary: 93 mg/dL (ref 70–99)
Glucose-Capillary: 106 mg/dL — ABNORMAL HIGH (ref 70–99)

## 2013-09-02 LAB — RENAL FUNCTION PANEL
Albumin: 1.9 g/dL — ABNORMAL LOW (ref 3.5–5.2)
BUN: 18 mg/dL (ref 6–23)
CALCIUM: 7.7 mg/dL — AB (ref 8.4–10.5)
CO2: 33 mEq/L — ABNORMAL HIGH (ref 19–32)
CREATININE: 3.04 mg/dL — AB (ref 0.50–1.35)
Chloride: 98 mEq/L (ref 96–112)
GFR calc Af Amer: 22 mL/min — ABNORMAL LOW (ref >90)
GFR, EST NON AFRICAN AMERICAN: 19 mL/min — AB (ref >90)
Glucose, Bld: 87 mg/dL (ref 70–99)
Phosphorus: 3.5 mg/dL (ref 2.3–4.6)
Potassium: 3.4 mEq/L — ABNORMAL LOW (ref 3.7–5.3)
SODIUM: 140 meq/L (ref 137–147)

## 2013-09-02 LAB — CBC
HCT: 22.3 % — ABNORMAL LOW (ref 39.0–52.0)
Hemoglobin: 7.2 g/dL — ABNORMAL LOW (ref 13.0–17.0)
MCH: 29.3 pg (ref 26.0–34.0)
MCHC: 32.3 g/dL (ref 30.0–36.0)
MCV: 90.7 fL (ref 78.0–100.0)
Platelets: 144 10*3/uL — ABNORMAL LOW (ref 150–400)
RBC: 2.46 MIL/uL — ABNORMAL LOW (ref 4.22–5.81)
RDW: 16.6 % — ABNORMAL HIGH (ref 11.5–15.5)
WBC: 20 10*3/uL — ABNORMAL HIGH (ref 4.0–10.5)

## 2013-09-02 LAB — PREPARE RBC (CROSSMATCH)

## 2013-09-02 MED ORDER — VANCOMYCIN 50 MG/ML ORAL SOLUTION
500.0000 mg | Freq: Four times a day (QID) | ORAL | Status: DC
  Administered 2013-09-02 – 2013-09-08 (×25): 500 mg via ORAL
  Filled 2013-09-02 (×28): qty 10

## 2013-09-02 MED ORDER — SODIUM CHLORIDE 0.9 % IV BOLUS (SEPSIS)
250.0000 mL | Freq: Once | INTRAVENOUS | Status: AC
  Administered 2013-09-02: 250 mL via INTRAVENOUS

## 2013-09-02 NOTE — Progress Notes (Signed)
Patient ID: Scott [name]  male  LKJ:[id number]    DOB: [birth year]    DOA: 08/23/2013  PCP: Bufford Buttner, MD  Interim summary  73 y.o. male with history of multiple myeloma receiving chemotherapy every Tuesday (last tx day bfr admit), chronic kidney disease, anemia, and s/p pacemaker placement who had been experiencing diarrhea for one week. Patient had been having at least 4-5 episodes of watery diarrhea which was dark in color. Denied abdominal pain or nausea/vomiting. Patient became short of breath and also noticed swelling in his lower extremities.  In the ER at [facility] the patient's chest x-ray showed vascular congestion. Patient was found to be hypotensive and creatinine was found to be increased from baseline (as per our records patient's creatinine was around 3.5 last March). Patient was transferred to [facility] due to his worsening renal function.   Assessment/Plan:  Principal Problem:  Acute respiratory failure with Healthcare associated pneumonia/bilateral pneumonia chest x-ray/ leukocytosis - off BiPAP currently, was placed back last night again, currently on O2 supplementation, sats 94% on 3L O2 via nasal cannula, does not use O2 at home - Will repeat 2 view chest x-ray tomorrow to assess pulmonary status with pneumonia  C. difficile colitis : Unfortunately no significant improvement, and patient is on IV antibiotics for pneumonia, difficult situation specially with worsening C. difficile colitis, Flexseal placed last night - Continue oral vancomycin, increase to 500 mg qid, discussed with ID, Dr. Graylon Good, will see patient for further condition   Acute renal failure on chronic kidney disease  Baseline crt ~3.5 - likely related to ischemic ATN in setting of volume depletion and hypotension - care as per Nephrology - now s/p HD #1 on 6/15, 6/16 - Patient approved for outpatient dialysis center to start on 6/23  HD vascular access  - Status post right IJ  hemodialysis catheter, postop day #3, Brachiobasilic AVF 7/94/80  - Vascular surgery following, Second stage right upper arm basilic vein transposition 1/65   Metabolic acidosis  Due to renal failure + profuse diarrhea - still on bicarb IV - HD ongoing,    Multiple myeloma  - Per nephrology, recently finished chemotherapy cycle, next cycle due but should be delayed due to acute infection   Multifactorial chronic anemia  no evidence of acute blood loss - labs confirm signif Fe deficiency - load IV per Nephrology   Severe Hypokalemia  Due to GI losses + poor intake, and now IV bicarb -improving  Thrombocytopenia  No evidence of bleeding - follow trend   DVT Prophylaxis:  Code Status:  Family Communication: discussed with patient and his wife at the bedside  Disposition: cont to monitor in SDU today  Consultants:  Nephrology  Vascular surgery  Procedures:  Hemodialysis 6/11 - B LE venous dopplers - no evidence of DVT, superficial thrombosis, or Baker's Cyst  6/11 - TTE - EF 55-60% - no WMA - grade 3 DD  6/15 - RIJ hemodialysis catheter   Antibiotics:  Flagyl oral discontinued this 6/18  IV vancomycin, IV cefepime 6/18>>  Oral vancomycin   Subjective: Of BiPAP, denies any fevers, chills or any chest pain. flexiseal placed, still ongoing diarrhea  Objective: Weight change: 3.4 kg (7 lb 7.9 oz)  Intake/Output Summary (Last 24 hours) at 09/02/13 1025 Last data filed at 09/02/13 0600  Gross per 24 hour  Intake    880 ml  Output      2 ml  Net    878 ml   Blood pressure  108/63, pulse 90, temperature 97.9 F (36.6 C), temperature source Oral, resp. rate 17, height 5' 5"  (1.651 m), weight 75.9 kg (167 lb 5.3 oz), SpO2 99.00%.  Physical Exam: General: Alert and awake, oriented x3, NAD CVS: S1-S2 clear, no murmur rubs or gallops Chest: Fairly clear to auscultation Abdomen: soft nontender, nondistended, normal bowel sounds  Extremities: no cyanosis, clubbing or  edema noted bilaterally    Lab Results: Basic Metabolic Panel:  Recent Labs Lab 08/31/13 1342 09/01/13 0400  NA 140 142  K 3.3* 4.1  CL 98 102  CO2 32 30  GLUCOSE 87 105*  BUN 34* 12  CREATININE 3.35* 1.80*  CALCIUM 7.9* 7.6*  PHOS 3.0 2.6   Liver Function Tests:  Recent Labs Lab 08/31/13 1342 09/01/13 0400  ALBUMIN 2.0* 2.0*   No results found for this basename: LIPASE, AMYLASE,  in the last 168 hours No results found for this basename: AMMONIA,  in the last 168 hours CBC:  Recent Labs Lab 08/31/13 1342 09/01/13 0400  WBC 18.6* 24.9*  HGB 7.7* 7.3*  HCT 23.2* 22.7*  MCV 90.6 90.4  PLT 119* 119*   Cardiac Enzymes: No results found for this basename: CKTOTAL, CKMB, CKMBINDEX, TROPONINI,  in the last 168 hours BNP: No components found with this basename: POCBNP,  CBG:  Recent Labs Lab 09/01/13 1014 09/01/13 1137 09/01/13 1559 09/01/13 2253 09/02/13 0815  GLUCAP 83 80 103* 114* 97     Micro Results: Recent Results (from the past 240 hour(s))  MRSA PCR SCREENING     Status: None   Collection Time    08/23/13  6:41 PM      Result Value Ref Range Status   MRSA by PCR NEGATIVE  NEGATIVE Final   Comment:            The GeneXpert MRSA Assay (FDA     approved for NASAL specimens     only), is one component of a     comprehensive MRSA colonization     surveillance program. It is not     intended to diagnose MRSA     infection nor to guide or     monitor treatment for     MRSA infections.  CLOSTRIDIUM DIFFICILE BY PCR     Status: Abnormal   Collection Time    08/23/13 10:36 PM      Result Value Ref Range Status   C difficile by pcr POSITIVE (*) NEGATIVE Final   Comment: CRITICAL RESULT CALLED TO, READ BACK BY AND VERIFIED WITH:     LINDSAY J RN 08/24/13 0740 COSTELLO B  STOOL CULTURE     Status: None   Collection Time    08/23/13 10:37 PM      Result Value Ref Range Status   Specimen Description STOOL   Final   Special Requests NONE   Final     Culture     Final   Value: NO SALMONELLA, SHIGELLA, CAMPYLOBACTER, YERSINIA, OR E.COLI [id number]:[id number] ISOLATED     Performed at [facility]   Report Status 08/27/2013 FINAL   Final    Studies/Results: Dg Chest 1 View  08/28/2013   CLINICAL DATA:  Central catheter placement  EXAM: CHEST - 1 VIEW  COMPARISON:  Study obtained earlier in the day  FINDINGS: Dual-lumen central catheter tip is at the cavoatrial junction. No pneumothorax. There is moderate generalized interstitial edema with patchy alveolar edema in the bases. Heart is mildly enlarged with mild pulmonary venous hypertension.  No adenopathy.  IMPRESSION: Central catheter tip at cavoatrial junction. No pneumothorax. Underlying congestive heart failure. Superimposed pneumonia in the bases cannot be excluded radiographically.   Electronically Signed   By: Lowella Grip M.D.   On: 08/28/2013 12:28   Dg Chest Port 1 View  08/28/2013   CLINICAL DATA:  Shortness of breath.  EXAM: PORTABLE CHEST - 1 VIEW  COMPARISON:  Chest radiograph from 08/23/2013  FINDINGS: The lungs are well-aerated. Bibasilar airspace opacification may reflect multifocal pneumonia or pulmonary edema. Small bilateral pleural effusions are suspected, though the costophrenic angles are incompletely imaged on this study. No pneumothorax is seen.  The cardiomediastinal silhouette is borderline normal in size. A pacemaker is noted overlying the left chest wall, with leads ending overlying the right atrium and right ventricle. No acute osseous abnormalities are seen.  IMPRESSION: Bibasilar airspace opacification may reflect multifocal pneumonia or pulmonary edema. Suspect small bilateral pleural effusions.   Electronically Signed   By: Garald Balding M.D.   On: 08/28/2013 03:58   Dg Chest Port 1 View  08/24/2013   CLINICAL DATA:  Shortness of breath.  Infiltrates.  EXAM: PORTABLE CHEST - 1 VIEW  COMPARISON:  08/23/2013  FINDINGS: Two lead cardiac pacemaker without change in  position. Cardiac enlargement with mild vascular congestion and interstitial edema. There seems to be mild progression since prior study. Atelectasis in the left lung base. No pneumothorax.  IMPRESSION: Cardiac enlargement with mild vascular congestion and interstitial edema, demonstrating some progression since prior study. Probable atelectasis in the left lung base.   Electronically Signed   By: Lucienne Capers M.D.   On: 08/24/2013 03:05   Dg Abd Portable 1v  08/24/2013   CLINICAL DATA:  Abdominal pain.  Blood in stools.  EXAM: PORTABLE ABDOMEN - 1 VIEW  COMPARISON:  CT abdomen and pelvis 12/1912.  FINDINGS: Examination is technically limited due to field of view. Portions of the abdomen and pelvis are not included. The bowel gas pattern is normal. No radio-opaque calculi or other significant radiographic abnormality are seen.  IMPRESSION: No evidence of bowel obstruction.   Electronically Signed   By: Lucienne Capers M.D.   On: 08/24/2013 03:06   Dg Fluoro Guide Cv Line-no Report  08/28/2013   CLINICAL DATA: insertion of dialysis catheter   FLOURO GUIDE CV LINE  Fluoroscopy was utilized by the requesting physician.  No radiographic  interpretation.     Medications: Scheduled Meds: . acyclovir  400 mg Oral Daily  . amiodarone  200 mg Oral Daily  . benzonatate  100 mg Oral TID  . ceFEPime (MAXIPIME) IV  2 g Intravenous Q T,Th,Sa-HD  . darbepoetin (ARANESP) injection - DIALYSIS  60 mcg Intravenous Q Tue-HD  . feeding supplement (NEPRO CARB STEADY)  237 mL Oral Q24H  . ferric gluconate (FERRLECIT/NULECIT) IV  125 mg Intravenous Q T,Th,Sa-HD  . megestrol  800 mg Oral Daily  . midodrine  10 mg Oral TID WC  . multivitamin  1 tablet Oral QHS  . sodium chloride  3 mL Intravenous Q12H  . vancomycin  500 mg Oral 4 times per day  . vancomycin  750 mg Intravenous Q T,Th,Sa-HD      LOS: 10 days   RAI,RIPUDEEP M.D. [facility] 09/02/2013, 10:25 AM Pager: [phone]  If 7PM-7AM, please  contact night-coverage [email or web address] Password [email or web address]  **Disclaimer: This note was dictated with voice recognition software. Similar sounding words can inadvertently be transcribed and this note may contain transcription errors which may not  have been corrected upon publication of note.**

## 2013-09-02 NOTE — Consult Note (Signed)
[facility]  Total days of antibiotics 10        Day 2 cefepime         Day 2 vanco        Day 2 oral vanco       Reason for Consult: leukocytosis    Referring Physician:rai  Active Problems:   Multiple myeloma   HCAP (healthcare-associated pneumonia)   Pacemaker   Acute respiratory failure   Renal failure (ARF), acute on chronic   Diarrhea   Hypokalemia   Anemia   C. difficile colitis    HPI: Scott [name] is a 73 y.o. male 73 y.o. male with history of multiple myeloma receiving chemotherapy every Tuesday , chronic kidney disease, anemia, and s/p pacemaker placement who was admitted on 08/23/13  For watery diarrhea x 7 days. Patient reported having at least 4-5 episodes of watery diarrhea which was dark in color. Denied abdominal pain or nausea/vomiting. Patient became short of breath and also noticed swelling in his lower extremities.  In the ER at [facility] the patient's chest x-ray showed vascular congestion. Patient was found to be hypotensive and with AKI.  He was transferred to [facility] for management. He was found to have cdifficile colitis started on oral vancomycin and IV metronidazole for 6 days until 6/17, was doing well and transitioned to oral metronidazole. However, prior to the switch, his WBC was trending up from 8 to 15 to 18 over 5 days. He had an acute episode of producitve cough, and patchy infiltrates on cxr taht was concerning for HCAP. He was started on vancomycin and cefepime but his diarrhea now has worsened requiring a rectal tube. His WBC still markedly elevated at 24K. He was reinitiated on oral vancomycin yesterday. He is day #2 for HCAP treatment   Past Medical History  Diagnosis Date  . Renal disorder   . Pacemaker   . Shortness of breath     "when I take one of my chemo pills" (03/17/2013)  . Protein calorie malnutrition   . Multiple myeloma   . Skin cancer of face     "once; left side of my face" (03/17/2013)  .  Pneumonia 06/09/2013  . Diabetes mellitus without complication     no meds  . Arthritis     "little in my right shoulder" (03/17/2013)  . History of blood transfusion   . Daily headache     denies  . Cataract     Allergies: No Known Allergies   MEDICATIONS: . acyclovir  400 mg Oral Daily  . amiodarone  200 mg Oral Daily  . benzonatate  100 mg Oral TID  . ceFEPime (MAXIPIME) IV  2 g Intravenous Q T,Th,Sa-HD  . darbepoetin (ARANESP) injection - DIALYSIS  60 mcg Intravenous Q Tue-HD  . feeding supplement (NEPRO CARB STEADY)  237 mL Oral Q24H  . ferric gluconate (FERRLECIT/NULECIT) IV  125 mg Intravenous Q T,Th,Sa-HD  . megestrol  800 mg Oral Daily  . midodrine  10 mg Oral TID WC  . multivitamin  1 tablet Oral QHS  . sodium chloride  3 mL Intravenous Q12H  . vancomycin  500 mg Oral 4 times per day  . vancomycin  750 mg Intravenous Q T,Th,Sa-HD    History  Substance Use Topics  . Smoking status: Current Every Day Smoker -- 0.25 packs/day for 60 years    Types: Cigarettes  . Smokeless tobacco: Never Used     Comment: 03/17/2013 "  smoked a cigarette yesterday; sometimes none"  . Alcohol Use: No    Family History  Problem Relation Age of Onset  . Diabetes Mellitus II Father     Review of Systems  Constitutional: Negative for fever, chills, diaphoresis, activity change, appetite change, fatigue and unexpected weight change.  HENT: Negative for congestion, sore throat, rhinorrhea, sneezing, trouble swallowing and sinus pressure.  Eyes: Negative for photophobia and visual disturbance.  Respiratory: Negative for cough, chest tightness, shortness of breath, wheezing and stridor.  Cardiovascular: Negative for chest pain, palpitations and leg swelling.  Gastrointestinal: Negative for nausea, vomiting, abdominal pain, diarrhea, constipation, blood in stool, abdominal distention and anal bleeding.  Genitourinary: Negative for dysuria, hematuria, flank pain and difficulty urinating.    Musculoskeletal: Negative for myalgias, back pain, joint swelling, arthralgias and gait problem.  Skin: Negative for color change, pallor, rash and wound.  Neurological: Negative for dizziness, tremors, weakness and light-headedness.  Hematological: Negative for adenopathy. Does not bruise/bleed easily.  Psychiatric/Behavioral: Negative for behavioral problems, confusion, sleep disturbance, dysphoric mood, decreased concentration and agitation.     OBJECTIVE: Temp:  [97.1 F (36.2 C)-98.5 F (36.9 C)] 97.9 F (36.6 C) (06/20 0818) Pulse Rate:  [60-90] 90 (06/20 0508) Resp:  [16-22] 17 (06/20 0508) BP: (90-108)/(38-63) 108/63 mmHg (06/20 0508) SpO2:  [94 %-100 %] 99 % (06/20 0508) FiO2 (%):  [50 %] 50 % (06/20 0508) Weight:  [167 lb 5.3 oz (75.9 kg)] 167 lb 5.3 oz (75.9 kg) (06/20 0508)  Constitutional: He is oriented to person, place, appears stated age. No distress.  HENT:  Mouth/Throat: Oropharynx is clear and moist. No oropharyngeal exudate.  Cardiovascular: Normal rate, regular rhythm and normal heart sounds. No g/m/r Pulmonary/Chest: Effort normal and breath sounds normal. No respiratory distress. He has no wheezes from anterior Lymphadenopathy:  He has no cervical adenopathy.  Neurological: He is alert and oriented to person, place, and time.  Skin: Skin is warm and dry. No rash noted. No erythema. Right arm wrapped  LABS: Results for orders placed during the hospital encounter of 08/23/13 (from the past 48 hour(s))  GLUCOSE, CAPILLARY     Status: None   Collection Time    08/31/13 12:27 PM      Result Value Ref Range   Glucose-Capillary 84  70 - 99 mg/dL  RENAL FUNCTION PANEL     Status: Abnormal   Collection Time    08/31/13  1:42 PM      Result Value Ref Range   Sodium 140  137 - 147 mEq/L   Potassium 3.3 (*) 3.7 - 5.3 mEq/L   Chloride 98  96 - 112 mEq/L   CO2 32  19 - 32 mEq/L   Glucose, Bld 87  70 - 99 mg/dL   BUN 34 (*) 6 - 23 mg/dL   Creatinine, Ser 3.35  (*) 0.50 - 1.35 mg/dL   Calcium 7.9 (*) 8.4 - 10.5 mg/dL   Phosphorus 3.0  2.3 - 4.6 mg/dL   Albumin 2.0 (*) 3.5 - 5.2 g/dL   GFR calc non Af Amer 17 (*) >90 mL/min   GFR calc Af Amer 20 (*) >90 mL/min   Comment: (NOTE)     The eGFR has been calculated using the CKD EPI equation.     This calculation has not been validated in all clinical situations.     eGFR's persistently <90 mL/min signify possible Chronic Kidney     Disease.  CBC     Status: Abnormal  Collection Time    08/31/13  1:42 PM      Result Value Ref Range   WBC 18.6 (*) 4.0 - 10.5 K/uL   Comment: WHITE COUNT CONFIRMED ON SMEAR   RBC 2.56 (*) 4.22 - 5.81 MIL/uL   Hemoglobin 7.7 (*) 13.0 - 17.0 g/dL   HCT 23.2 (*) 39.0 - 52.0 %   MCV 90.6  78.0 - 100.0 fL   MCH 30.1  26.0 - 34.0 pg   MCHC 33.2  30.0 - 36.0 g/dL   RDW 16.1 (*) 11.5 - 15.5 %   Platelets 119 (*) 150 - 400 K/uL   Comment: PLATELET COUNT CONFIRMED BY SMEAR  GLUCOSE, CAPILLARY     Status: None   Collection Time    08/31/13  6:03 PM      Result Value Ref Range   Glucose-Capillary 72  70 - 99 mg/dL  GLUCOSE, CAPILLARY     Status: Abnormal   Collection Time    08/31/13  9:59 PM      Result Value Ref Range   Glucose-Capillary 139 (*) 70 - 99 mg/dL  RENAL FUNCTION PANEL     Status: Abnormal   Collection Time    09/01/13  4:00 AM      Result Value Ref Range   Sodium 142  137 - 147 mEq/L   Potassium 4.1  3.7 - 5.3 mEq/L   Comment: DELTA CHECK NOTED   Chloride 102  96 - 112 mEq/L   CO2 30  19 - 32 mEq/L   Glucose, Bld 105 (*) 70 - 99 mg/dL   BUN 12  6 - 23 mg/dL   Comment: DELTA CHECK NOTED   Creatinine, Ser 1.80 (*) 0.50 - 1.35 mg/dL   Comment: DELTA CHECK NOTED   Calcium 7.6 (*) 8.4 - 10.5 mg/dL   Phosphorus 2.6  2.3 - 4.6 mg/dL   Albumin 2.0 (*) 3.5 - 5.2 g/dL   GFR calc non Af Amer 36 (*) >90 mL/min   GFR calc Af Amer 42 (*) >90 mL/min   Comment: (NOTE)     The eGFR has been calculated using the CKD EPI equation.     This calculation has  not been validated in all clinical situations.     eGFR's persistently <90 mL/min signify possible Chronic Kidney     Disease.  CBC     Status: Abnormal   Collection Time    09/01/13  4:00 AM      Result Value Ref Range   WBC 24.9 (*) 4.0 - 10.5 K/uL   RBC 2.51 (*) 4.22 - 5.81 MIL/uL   Hemoglobin 7.3 (*) 13.0 - 17.0 g/dL   HCT 22.7 (*) 39.0 - 52.0 %   MCV 90.4  78.0 - 100.0 fL   MCH 29.1  26.0 - 34.0 pg   MCHC 32.2  30.0 - 36.0 g/dL   RDW 16.4 (*) 11.5 - 15.5 %   Platelets 119 (*) 150 - 400 K/uL   Comment: CONSISTENT WITH PREVIOUS RESULT  GLUCOSE, CAPILLARY     Status: None   Collection Time    09/01/13 10:14 AM      Result Value Ref Range   Glucose-Capillary 83  70 - 99 mg/dL   Comment 1 Notify RN     Comment 2 Documented in Chart    GLUCOSE, CAPILLARY     Status: None   Collection Time    09/01/13 11:37 AM      Result Value Ref Range  Glucose-Capillary 80  70 - 99 mg/dL   Comment 1 Notify RN     Comment 2 Documented in Chart    GLUCOSE, CAPILLARY     Status: Abnormal   Collection Time    09/01/13  3:59 PM      Result Value Ref Range   Glucose-Capillary 103 (*) 70 - 99 mg/dL  GLUCOSE, CAPILLARY     Status: Abnormal   Collection Time    09/01/13 10:53 PM      Result Value Ref Range   Glucose-Capillary 114 (*) 70 - 99 mg/dL   Comment 1 Documented in Chart     Comment 2 Notify RN    GLUCOSE, CAPILLARY     Status: None   Collection Time    09/02/13  8:15 AM      Result Value Ref Range   Glucose-Capillary 97  70 - 99 mg/dL   Comment 1 Notify RN     Comment 2 Documented in Chart      MICRO:  IMAGING: Dg Chest Port 1 View  09/01/2013   CLINICAL DATA:  Cough.  Short of breath.  EXAM: PORTABLE CHEST - 1 VIEW  COMPARISON:  08/30/2013.  08/28/2013.  FINDINGS: Right IJ dialysis catheter is present, unchanged. Two lead left subclavian pacemaker leads are also unchanged there is moderate volume overload/ CHF. Dense retrocardiac opacity likely representing a combination of  effusion, atelectasis and edema. Bilateral basilar airspace disease and atelectasis is present. The airspace disease is compatible with pulmonary edema.  When comparing to the prior chest radiograph of 08/28/2013, pulmonary edema is minimally improved.  IMPRESSION: Mildly improved volume overload/ CHF, still moderate. Prominent basilar opacity represents a combination of pleural effusions, atelectasis and airspace disease/edema  These results were called by telephone at the time of interpretation on 09/01/2013 at 12:57 AM to Dr. Chaney Malling , who verbally acknowledged these results.   Electronically Signed   By: Dereck Ligas M.D.   On: 09/01/2013 00:57    Assessment/Plan:  73yo M immunocompromised host due to MM, who was originally found to have cdifficile colitis but now has possible HCAP and worsening diarrhea  - c.difficile colitis = recommend to treat with high dose oral vancomycin at 577m QID for 10 days then do slow taper. It appears that he probably did not tolerate the switch to oral metronidazole. Unlikely to make any headway with cdifficile infection while he is being treated for pneumonia unfortunately. Will need to continue for supportive care during the next 5 days of tx for HCAP. I would not think this is failure to oral vancomycin at this time  - hcap = finish course of hcap. Currently on vancomycin and cefepime. Please get sputum cx, and can consider narrowing his regimen. There are studies to suggest that 5 days is sufficient for HCAP treatment. May consider shortening the course if he looks much improved, in order to treat cdifficile infection  CCaren GriffinsB. [facility] [phone]

## 2013-09-02 NOTE — Procedures (Signed)
I have personally attended this patient's dialysis session.   Labs pending Will transfuse on HD if Hb less than 7 TDC 400 Tight heparin Thanos [place]ineau B

## 2013-09-02 NOTE — Progress Notes (Signed)
Patient ID: Scott [name], male   DOB: 1940-09-24, 73 y.o.   MRN: [id number] Comfortable from a respiratory standpoint. Right arm and dressing removed. All wounds healing nicely. Excellent thrill with minimal bruising. Discussed with patient and wife present. Will not follow actively. I will see him in the office in one month for followup of his basilic vein transposition

## 2013-09-02 NOTE — Progress Notes (Addendum)
Background 73yo WM with PMH sig for multiple myeloma (currently chemotherapy on 08/22/13), DM, HTN, advanced CKD stage 4 (baseline Scr 3.3-3.9) who was admitted on 08/23/13 with a 3 day h/o diarrhea, and noted to be hypotensive, and in respiratory distress after he was given IV bolus at [facility]. He was subsequently found to be C. Diff + so is being treated for that. Transferred from [facility] for management of AKI on CKD  in setting of volume depletion, hypotension, and C. Diff colitis in pt with advanced CKD at baseline and multiple myeloma. BUN over 100, creatinine 5.6 Poor UOP. Dialysis initiated  Assessment/Plan:  1. AKI on advancedCKD/ now with new ESRD/CKD5- likely related to ischemic ATN in setting of volume depletion and hypotension. Oliguric. New ESRD. Had [facility] placed 6/15 Dr. Donnetta Scott [name] [name].  CLIP process started - has spot at [facility] TTS when ready for discharge 2. Inadequate brachiocephalic AVF - s/p second stage BVT today (6/19) Dr. Donnetta Scott [name] [name] 3. C. Diff colitis- on po flagyl and was still having diarrhea (was changed from vanco?) - incr stools - spoke with primary - back on po vanco; ID to see and address TMT 4. Bilat PNA on CXR/HAP; cough with purulent sputum. Treating as HCAP - vanco/cefipime (6/18) 5. Multiple myeloma- recently finished a cycle of chemo 6/9. Next cycle due but should be delayed with infx 5.  Anemia - Aranesp 60 QTues started; iron load with HD TTS for fe <10, cannot calculate tsat Stop oral iron. Hb down to 7.3 6/19. Labs not done today as ordered.  Check in HD and if under 7 will give PRBC's/ 6.  Bones - PTH 166 - within outpt goal range of 150-300 on no med at this time 7. Hypotension - last 48 hours or so BP's very soft.  Does not appear dry.  Stopped tamsulosin.  Added midodrine to facilitate vol removal with HD 8. Weakness/deconditoning - OT/PT  Subjective:   Unable to pull fluid with last HD d/t hypotension. For HD today and is on TTS  schedule ID to see today to evaluate his CDiff Rx (vanco po increased) and HCAP Rx Now has flexisesal   Objective: Vital signs in last 24 hours: Temp:  [97.1 F (36.2 C)-98.5 F (36.9 C)] 97.9 F (36.6 C) (06/20 0818) Pulse Rate:  [60-90] 90 (06/20 0508) Resp:  [16-22] 17 (06/20 0508) BP: (90-108)/(38-63) 108/63 mmHg (06/20 0508) SpO2:  [94 %-100 %] 99 % (06/20 0508) FiO2 (%):  [50 %] 50 % (06/20 0508) Weight:  [75.9 kg (167 lb 5.3 oz)] 75.9 kg (167 lb 5.3 oz) (06/20 0508) Weight change: 3.4 kg (7 lb 7.9 oz)  Intake/Output from previous day: 06/19 0701 - 06/20 0700 In: 1180 [P.O.:180; I.V.:1000] Out: 2 [Stool:2] Intake/Output this shift:   BP 108/63  Pulse 90  Temp(Src) 97.9 F (36.6 C) (Oral)  Resp 17  Ht _0  (1.651 m)  Wt 75.9 kg (167 lb 5.3 oz)  BMI 27.85 kg/m2  SpO2 99% Wearing O2 and currently breathing OK Says he is not hungry Right TDC with dressing intact/non-tender Right AVF (2nd stage BVT done 6/19) with good bruit and wounds dry Lungs grossly clear anteriorly/reduced BS posteriorly with some base crackles S1S2 No S3 GI: soft, non-tender; bowel sounds normal; no masses,  no organomegaly Trace edema of LE's  Flexiseal with watery stool  LABS NOT DONE TODAY AS REQUESTED - WILL OBTAIN IN HD  Recent Labs  08/31/13 1342 09/01/13 0400  WBC 18.6* 24.9*  HGB 7.7* 7.3*  HCT 23.2* 22.7*  PLT 119* 119*   Results for Scott [name], Scott [name] [name] (MRN [id number]) as of 08/29/2013 14:55  Ref. Range 08/29/2013 03:14  Iron Latest Range: 42-135 ug/dL <10 (L)  UIBC Latest Range: 125-400 ug/dL 90 (L)  TIBC Latest Range: 215-435 ug/dL Not calculated due to Iron <10.  Saturation Ratios Latest Range: 20-55 % Not calculated due to Iron <10.  Ferritin Latest Range: 22-322 ng/mL 812 (H)   Results for Scott [name] [name] [name], Scott [name] [name] [name] (MRN [id number]) as of 08/31/2013 12:41  Ref. Range 08/29/2013 03:14  PTH Latest Range: 14.0-72.0 pg/mL 166.5 (H)    Recent Labs  08/31/13 1342 09/01/13 0400  NA 140  142  K 3.3* 4.1  CL 98 102  CO2 32 30  GLUCOSE 87 105*  BUN 34* 12  CREATININE 3.35* 1.80*  CALCIUM 7.9* 7.6*   Studies/Results: Dg Chest Port 1 View  09/01/2013   CLINICAL DATA:  Cough.  Short of breath.  EXAM: PORTABLE CHEST - 1 VIEW  COMPARISON:  08/30/2013.  08/28/2013.  FINDINGS: Right IJ dialysis catheter is present, unchanged. Two lead left subclavian pacemaker leads are also unchanged there is moderate volume overload/ CHF. Dense retrocardiac opacity likely representing a combination of effusion, atelectasis and edema. Bilateral basilar airspace disease and atelectasis is present. The airspace disease is compatible with pulmonary edema.  When comparing to the prior chest radiograph of 08/28/2013, pulmonary edema is minimally improved.  IMPRESSION: Mildly improved volume overload/ CHF, still moderate. Prominent basilar opacity represents a combination of pleural effusions, atelectasis and airspace disease/edema  These results were called by telephone at the time of interpretation on 09/01/2013 at 12:57 AM to Scott [name] [name] [name] , who verbally acknowledged these results.   Electronically Signed   By: Scott [name] Ligas M.D.   On: 09/01/2013 00:57    Scheduled: . acyclovir  400 mg Oral Daily  . amiodarone  200 mg Oral Daily  . benzonatate  100 mg Oral TID  . ceFEPime (MAXIPIME) IV  2 g Intravenous Q T,Th,Sa-HD  . darbepoetin (ARANESP) injection - DIALYSIS  60 mcg Intravenous Q Tue-HD  . feeding supplement (NEPRO CARB STEADY)  237 mL Oral Q24H  . ferric gluconate (FERRLECIT/NULECIT) IV  125 mg Intravenous Q T,Th,Sa-HD  . megestrol  800 mg Oral Daily  . midodrine  10 mg Oral TID WC  . multivitamin  1 tablet Oral QHS  . sodium chloride  3 mL Intravenous Q12H  . vancomycin  500 mg Oral 4 times per day  . vancomycin  750 mg Intravenous Q T,Th,Sa-HD     LOS: 10 days   [name],Scott [name] B 09/02/2013,1:22 PM

## 2013-09-03 LAB — CBC
HCT: 26 % — ABNORMAL LOW (ref 39.0–52.0)
Hemoglobin: 8.4 g/dL — ABNORMAL LOW (ref 13.0–17.0)
MCH: 29.2 pg (ref 26.0–34.0)
MCHC: 32.3 g/dL (ref 30.0–36.0)
MCV: 90.3 fL (ref 78.0–100.0)
PLATELETS: 143 10*3/uL — AB (ref 150–400)
RBC: 2.88 MIL/uL — ABNORMAL LOW (ref 4.22–5.81)
RDW: 16.3 % — AB (ref 11.5–15.5)
WBC: 20.6 10*3/uL — ABNORMAL HIGH (ref 4.0–10.5)

## 2013-09-03 LAB — GLUCOSE, CAPILLARY
GLUCOSE-CAPILLARY: 107 mg/dL — AB (ref 70–99)
Glucose-Capillary: 81 mg/dL (ref 70–99)
Glucose-Capillary: 76 mg/dL (ref 70–99)
Glucose-Capillary: 85 mg/dL (ref 70–99)

## 2013-09-03 LAB — RENAL FUNCTION PANEL
ALBUMIN: 1.9 g/dL — AB (ref 3.5–5.2)
BUN: 7 mg/dL (ref 6–23)
CALCIUM: 7.7 mg/dL — AB (ref 8.4–10.5)
CO2: 30 mEq/L (ref 19–32)
Chloride: 101 mEq/L (ref 96–112)
Creatinine, Ser: 1.73 mg/dL — ABNORMAL HIGH (ref 0.50–1.35)
GFR calc Af Amer: 44 mL/min — ABNORMAL LOW (ref >90)
GFR, EST NON AFRICAN AMERICAN: 38 mL/min — AB (ref >90)
Glucose, Bld: 71 mg/dL (ref 70–99)
Phosphorus: 1.9 mg/dL — ABNORMAL LOW (ref 2.3–4.6)
Potassium: 3.8 mEq/L (ref 3.7–5.3)
Sodium: 140 mEq/L (ref 137–147)

## 2013-09-03 LAB — TYPE AND SCREEN
ABO/RH(D): A POS
Antibody Screen: NEGATIVE
UNIT DIVISION: 0

## 2013-09-03 MED ORDER — ALPRAZOLAM 0.25 MG PO TABS: 0.2500 mg | ORAL_TABLET | Freq: Two times a day (BID) | ORAL | Status: DC | PRN

## 2013-09-03 MED ORDER — ALPRAZOLAM 0.25 MG PO TABS
0.2500 mg | ORAL_TABLET | Freq: Every evening | ORAL | Status: DC | PRN
  Administered 2013-09-03 – 2013-09-05 (×2): 0.25 mg via ORAL
  Filled 2013-09-03 (×2): qty 1

## 2013-09-03 NOTE — Progress Notes (Signed)
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 Subsequently found to be C. Diff + and on Rx fr that. AKI d/t volume depletion, hypotension and CDiff colitis on top of advanced CKD at baseline and has not recovered function.  Dialysis dependent since 6/15 and Dr. Florene Glen his primary nephrologist deemed him ESRD. Remains oligoanuric.  Assessment/Plan:  1. AKI on advancedCKD/ now with new ESRD/CKD5- likely related to ischemic ATN in setting of volume depletion and hypotension. Oliguric. New ESRD. Had [facility] placed 6/15 Dr. Donnetta Hutching.  CLIP  - has spot at [facility] TTS when ready for discharge. Have not established (reached) a dry weight 2. Right brachiocephalic AVF - s/p second stage BVT  (6/19) Dr. Donnetta Hutching 3. C. Diff colitis- Still with substantial stool output.  Has been changed to high dose po vanco - not yet deemed a treatment failure with vanco (started on vanc - got changed to flagyl - escalation in diarrhea - back to vanco and ID recs 500 mg QID dosing for now) 4. Bilat PNA on CXR/HAP; cough with purulent sputum. Treating as HCAP - vanco/cefipime (6/18-6/22) 5. Pulm edema - have been unable to effectively pull fluid last couple HD treatments d/t low BP; CXR 6/18 still wet; able to get 1.7 l with last HD 6. Multiple myeloma- recently finished a cycle of chemo 6/9. Next cycle due but should be delayed with infx 7.  Anemia - Aranesp 60 QTues started; iron load with HD TTS for fe <10, cannot calculate tsat Stopped oral iron. Rec'd 1 unit PRBC's in HD 6/20 for Hb 9.2 8.  Bones - PTH 166 - within outpt goal range of 150-300 on no med at this time. Phos low (1.9) no binders 9.  Hypotension - BP's very  soft.   Stopped tamsulosin.  Added midodrine to facilitate vol removal with HD but pt still with edema LE's and by CXR 6/18 10. Weakness/deconditoning - OT/PT will be needed 11. Left sided PPM  Subjective: Still with loose black stool in flexiseal Still with episodic SOB that occurs primarily in the evenins, asking for BIPAP Right now says "feels all right" Clearly more comfortable when his wife is around Dialysis yesterday + 1 unit blood Able to get 1.7 liters with HD yest despite low BP (I think the PRBC's helped)  Objective: Vital signs in last 24 hours: Temp:  [97.8 F (36.6 C)-98.3 F (36.8 C)] 97.8 F (36.6 C) (06/21 0734) Pulse Rate:  [58-85] 62 (06/21 0734) Resp:  [17-30] 22 (06/21 0734) BP: (85-115)/(42-69) 107/53 mmHg (06/21 0734) SpO2:  [94 %-100 %] 95 % (06/21 0734) FiO2 (%):  [50 %] 50 % (06/21 0326) Weight:  [74.8 kg (164 lb 14.5 oz)-76.9 kg (169 lb 8.5 oz)] 76.9 kg (169 lb 8.5 oz) (06/21 0326) Weight change: 0.6 kg (1 lb 5.2 oz)  Intake/Output from previous day: 06/20 0701 - 06/21 0700 In: 755 [P.O.:120; I.V.:50; Blood:335; IV Piggyback:250] Out: 1700  Intake/Output this shift:   BP 107/53  Pulse 62  Temp(Src) 97.8 F (36.6 C) (Oral)  Resp 22  Ht 5' 5"  (1.651 m)  Wt 76.9 kg (169 lb 8.5 oz)  BMI 28.21 kg/m2  SpO2 95% Wearing O2 and currently breathing OK  Right TDC with dressing intact/non-tender Right AVF (2nd stage BVT done 6/19) with good bruit and wounds dry Lungs grossly clear anteriorly/reduced BS posteriorly with some base crackles S1S2 No S3 GI: soft, non-tender; bowel sounds normal; no masses,  no organomegaly Trace-1+ edema of LE's Flexiseal with watery stool  Recent Labs  09/02/13 1441 09/03/13 0240  WBC 20.0* 20.6*  HGB 7.2* 8.4*  HCT 22.3* 26.0*  PLT 144* 143*   Results for TYRA, MICHELLE (MRN [id number]) as of 08/29/2013 14:55  Ref. Range 08/29/2013 03:14  Iron Latest Range: 42-135 ug/dL <10 (L)  UIBC Latest Range: 125-400 ug/dL  90 (L)  TIBC Latest Range: 215-435 ug/dL Not calculated due to Iron <10.  Saturation Ratios Latest Range: 20-55 % Not calculated due to Iron <10.  Ferritin Latest Range: 22-322 ng/mL 812 (H)   Results for TYRE, BEAVER (MRN [id number]) as of 08/31/2013 12:41  Ref. Range 08/29/2013 03:14  PTH Latest Range: 14.0-72.0 pg/mL 166.5 (H)    Recent Labs  09/02/13 1441 09/03/13 0240  NA 140 140  K 3.4* 3.8  CL 98 101  CO2 33* 30  GLUCOSE 87 71  BUN 18 7  CREATININE 3.04* 1.73*  CALCIUM 7.7* 7.7*  Phos                               1.9                                  Studies/Results: No results found.  Scheduled: . acyclovir  400 mg Oral Daily  . amiodarone  200 mg Oral Daily  . benzonatate  100 mg Oral TID  . ceFEPime (MAXIPIME) IV  2 g Intravenous Q T,Th,Sa-HD  . darbepoetin (ARANESP) injection - DIALYSIS  60 mcg Intravenous Q Tue-HD  . feeding supplement (NEPRO CARB STEADY)  237 mL Oral Q24H  . ferric gluconate (FERRLECIT/NULECIT) IV  125 mg Intravenous Q T,Th,Sa-HD  . megestrol  800 mg Oral Daily  . midodrine  10 mg Oral TID WC  . multivitamin  1 tablet Oral QHS  . sodium chloride  3 mL Intravenous Q12H  . vancomycin  500 mg Oral 4 times per day  . vancomycin  750 mg Intravenous Q T,Th,Sa-HD     LOS: 11 days   DUNHAM,CYNTHIA B 09/03/2013,11:14 AM

## 2013-09-03 NOTE — Progress Notes (Signed)
ANTIBIOTIC CONSULT NOTE - FOLLOW UP  Pharmacy Consult for Vancomycin and Cefepime Indication: rule out pneumonia (HCAP)  No Known Allergies  Patient Measurements: Height: 5' 5"  (165.1 cm) Weight: 169 lb 8.5 oz (76.9 kg) IBW/kg (Calculated) : 61.5  Vital Signs: Temp: 97.8 F (36.6 C) (06/21 0734) Temp src: Oral (06/21 0734) BP: 107/53 mmHg (06/21 0734) Pulse Rate: 62 (06/21 0734) Intake/Output from previous day: 06/20 0701 - 06/21 0700 In: 755 [P.O.:120; I.V.:50; Blood:335; IV Piggyback:250] Out: 1700  Intake/Output from this shift:    Labs:  Recent Labs  09/01/13 0400 09/02/13 1441 09/03/13 0240  WBC 24.9* 20.0* 20.6*  HGB 7.3* 7.2* 8.4*  PLT 119* 144* 143*  CREATININE 1.80* 3.04* 1.73*   Estimated Creatinine Clearance: 37.5 ml/min (by C-G formula based on Cr of 1.73).   New start HD with plan for TTS ongoing HD schedule.  Microbiology: Recent Results (from the past 720 hour(s))  MRSA PCR SCREENING     Status: None   Collection Time    08/23/13  6:41 PM      Result Value Ref Range Status   MRSA by PCR NEGATIVE  NEGATIVE Final   Comment:            The GeneXpert MRSA Assay (FDA     approved for NASAL specimens     only), is one component of a     comprehensive MRSA colonization     surveillance program. It is not     intended to diagnose MRSA     infection nor to guide or     monitor treatment for     MRSA infections.  CLOSTRIDIUM DIFFICILE BY PCR     Status: Abnormal   Collection Time    08/23/13 10:36 PM      Result Value Ref Range Status   C difficile by pcr POSITIVE (*) NEGATIVE Final   Comment: CRITICAL RESULT CALLED TO, READ BACK BY AND VERIFIED WITH:     LINDSAY J RN 08/24/13 0740 COSTELLO B  STOOL CULTURE     Status: None   Collection Time    08/23/13 10:37 PM      Result Value Ref Range Status   Specimen Description STOOL   Final   Special Requests NONE   Final   Culture     Final   Value: NO SALMONELLA, SHIGELLA, CAMPYLOBACTER, YERSINIA,  OR E.COLI 0157:H7 ISOLATED     Performed at [facility]   Report Status 08/27/2013 FINAL   Final    Medical History: Past Medical History  Diagnosis Date  . Renal disorder   . Pacemaker   . Shortness of breath     "when I take one of my chemo pills" (03/17/2013)  . Protein calorie malnutrition   . Multiple myeloma   . Skin cancer of face     "once; left side of my face" (03/17/2013)  . Pneumonia 06/09/2013  . Diabetes mellitus without complication     no meds  . Arthritis     "little in my right shoulder" (03/17/2013)  . History of blood transfusion   . Daily headache     denies  . Cataract     Medications:  Scheduled:  . acyclovir  400 mg Oral Daily  . amiodarone  200 mg Oral Daily  . benzonatate  100 mg Oral TID  . ceFEPime (MAXIPIME) IV  2 g Intravenous Q T,Th,Sa-HD  . darbepoetin (ARANESP) injection - DIALYSIS  60 mcg Intravenous Q Tue-HD  .  feeding supplement (NEPRO CARB STEADY)  237 mL Oral Q24H  . ferric gluconate (FERRLECIT/NULECIT) IV  125 mg Intravenous Q T,Th,Sa-HD  . megestrol  800 mg Oral Daily  . midodrine  10 mg Oral TID WC  . multivitamin  1 tablet Oral QHS  . sodium chloride  3 mL Intravenous Q12H  . vancomycin  500 mg Oral 4 times per day  . vancomycin  750 mg Intravenous Q T,Th,Sa-HD   Assessment: 73 yo M admitted 6/10 with reports of watery diarrhea for the previous week.  Pt was found to have c.diff and is currently on PO Vancomycin.  Pt now has bilateral PNA on CXR and increased cough and sputum production.  Pt is on day #3 Vancomycin and Cefepime for HCAP coverage.  Pt remains afebrile.  WBC is elevated but trending down.  O2 requirements have improved over the last 48 hours.  New HD with plans for TTS HD schedule.  Next HD scheduled for 6/23.  Goal of Therapy:  Renal dose adjustment of medications Vancomycin pre-HD level 15-25 mcg/ml  Plan:  Vancomycin 750 mg IV with HD - TTS.   Cefepime 2gm IV after each HD session TTS.  [employer], Pharm.D., BCPS Clinical Pharmacist Pager [phone] 09/03/2013 8:13 AM

## 2013-09-03 NOTE — Progress Notes (Signed)
Patient ID: Scott [name]  male  EFE:[id number]    DOB: Nov 09, 1940    DOA: 08/23/2013  PCP: Bufford Buttner, MD  Interim summary  73 y.o. male with history of multiple myeloma receiving chemotherapy every Tuesday (last tx day bfr admit), chronic kidney disease, anemia, and s/p pacemaker placement who had been experiencing diarrhea for one week. Patient had been having at least 4-5 episodes of watery diarrhea which was dark in color. Denied abdominal pain or nausea/vomiting. Patient became short of breath and also noticed swelling in his lower extremities.  In the ER at [facility] the patient's chest x-ray showed vascular congestion. Patient was found to be hypotensive and creatinine was found to be increased from baseline (as per our records patient's creatinine was around 3.5 last March). Patient was transferred to [facility] due to his worsening renal function.   Assessment/Plan:  Principal Problem:  Acute respiratory failure with Healthcare associated pneumonia/bilateral pneumonia chest x-ray/ leukocytosis - off BiPAP currently, was placed back last night again, currently on O2 supplementation - I'm concerned if patient is having anxiety issues at night, feeling short of breath, his wife at the bedside also confirmed possible anxiety. Patient requesting to be placed on BiPAP every night.  - He should have outpatient sleep study but will try CPAP as needed, low-dose benzodiazepine at bedtime for anxiety - Repeat chest x-ray in a.m. for pulmonary status. - Reviewed ID recommendations from Dr. Graylon Good, recommended 5 days of total IV antibiotics for pneumonia.  C. difficile colitis :  patient is on IV antibiotics for pneumonia, difficult situation specially with worsening C. difficile colitis,  - Flexseal placed - Continue oral vancomycin, increase to 500 mg qid. Hopefully, will improve after IV antibiotics off   Acute renal failure on chronic kidney disease  Baseline crt ~3.5 -  likely related to ischemic ATN in setting of volume depletion and hypotension - care as per Nephrology - now s/p HD #1 on 6/15, 6/16 - Patient approved for outpatient dialysis center to start on 6/23  HD vascular access  - Status post right IJ hemodialysis catheter, Brachiobasilic AVF 8/32/54  - Vascular surgery following, Second stage right upper arm basilic vein transposition 9/82   Metabolic acidosis resolved, likely due to renal failure + profuse diarrhea, HD ongoing    Multiple myeloma  - Per nephrology, recently finished chemotherapy cycle, next cycle due but should be delayed due to acute infection   Multifactorial chronic anemia  no evidence of acute blood loss - labs confirm signif Fe deficiency - load IV per Nephrology  Transfused one unit packed RBC yesterday   Severe Hypokalemia  Due to GI losses + poor intake,  currently improved   Thrombocytopenia  No evidence of bleeding - follow trend , Currently improving   DVT Prophylaxis:  Code Status:  Family Communication: discussed with patient and his wife at the bedside  Disposition: cont to monitor in SDU today  Consultants:  Nephrology  Vascular surgery  Procedures:  Hemodialysis 6/11 - B LE venous dopplers - no evidence of DVT, superficial thrombosis, or Baker's Cyst  6/11 - TTE - EF 55-60% - no WMA - grade 3 DD  6/15 - RIJ hemodialysis catheter   Antibiotics:  Flagyl oral discontinued this 6/18  IV vancomycin, IV cefepime 6/18>>  Oral vancomycin   Subjective: Off BiPAP,  denies any fevers, chills or shortness of breath at the time of my examination   Objective: Weight change: 0.6 kg (1 lb 5.2 oz)  Intake/Output Summary (Last 24 hours) at 09/03/13 0942 Last data filed at 09/02/13 2011  Gross per 24 hour  Intake    755 ml  Output   1700 ml  Net   -945 ml   Blood pressure 107/53, pulse 62, temperature 97.8 F (36.6 C), temperature source Oral, resp. rate 22, height 5' 5"  (1.651 m), weight 76.9  kg (169 lb 8.5 oz), SpO2 95.00%.  Physical Exam: General: Alert and awake, oriented x3, NAD CVS: S1-S2 clear, no murmur rubs or gallops Chest:  CTA B  Abdomen: soft nontender, nondistended, normal bowel sounds  Extremities: no cyanosis, clubbing or edema noted bilaterally    Lab Results: Basic Metabolic Panel:  Recent Labs Lab 09/02/13 1441 09/03/13 0240  NA 140 140  K 3.4* 3.8  CL 98 101  CO2 33* 30  GLUCOSE 87 71  BUN 18 7  CREATININE 3.04* 1.73*  CALCIUM 7.7* 7.7*  PHOS 3.5 1.9*   Liver Function Tests:  Recent Labs Lab 09/02/13 1441 09/03/13 0240  ALBUMIN 1.9* 1.9*   No results found for this basename: LIPASE, AMYLASE,  in the last 168 hours No results found for this basename: AMMONIA,  in the last 168 hours CBC:  Recent Labs Lab 09/02/13 1441 09/03/13 0240  WBC 20.0* 20.6*  HGB 7.2* 8.4*  HCT 22.3* 26.0*  MCV 90.7 90.3  PLT 144* 143*   Cardiac Enzymes: No results found for this basename: CKTOTAL, CKMB, CKMBINDEX, TROPONINI,  in the last 168 hours BNP: No components found with this basename: POCBNP,  CBG:  Recent Labs Lab 09/01/13 2253 09/02/13 0815 09/02/13 1249 09/02/13 2136 09/03/13 0808  GLUCAP 114* 97 93 106* 81     Micro Results: No results found for this or any previous visit (from the past 240 hour(s)).  Studies/Results: Dg Chest 1 View  08/28/2013   CLINICAL DATA:  Central catheter placement  EXAM: CHEST - 1 VIEW  COMPARISON:  Study obtained earlier in the day  FINDINGS: Dual-lumen central catheter tip is at the cavoatrial junction. No pneumothorax. There is moderate generalized interstitial edema with patchy alveolar edema in the bases. Heart is mildly enlarged with mild pulmonary venous hypertension. No adenopathy.  IMPRESSION: Central catheter tip at cavoatrial junction. No pneumothorax. Underlying congestive heart failure. Superimposed pneumonia in the bases cannot be excluded radiographically.   Electronically Signed   By:  Lowella Grip M.D.   On: 08/28/2013 12:28   Dg Chest Port 1 View  08/28/2013   CLINICAL DATA:  Shortness of breath.  EXAM: PORTABLE CHEST - 1 VIEW  COMPARISON:  Chest radiograph from 08/23/2013  FINDINGS: The lungs are well-aerated. Bibasilar airspace opacification may reflect multifocal pneumonia or pulmonary edema. Small bilateral pleural effusions are suspected, though the costophrenic angles are incompletely imaged on this study. No pneumothorax is seen.  The cardiomediastinal silhouette is borderline normal in size. A pacemaker is noted overlying the left chest wall, with leads ending overlying the right atrium and right ventricle. No acute osseous abnormalities are seen.  IMPRESSION: Bibasilar airspace opacification may reflect multifocal pneumonia or pulmonary edema. Suspect small bilateral pleural effusions.   Electronically Signed   By: Garald Balding M.D.   On: 08/28/2013 03:58   Dg Chest Port 1 View  08/24/2013   CLINICAL DATA:  Shortness of breath.  Infiltrates.  EXAM: PORTABLE CHEST - 1 VIEW  COMPARISON:  08/23/2013  FINDINGS: Two lead cardiac pacemaker without change in position. Cardiac enlargement with mild vascular congestion and interstitial edema. There  seems to be mild progression since prior study. Atelectasis in the left lung base. No pneumothorax.  IMPRESSION: Cardiac enlargement with mild vascular congestion and interstitial edema, demonstrating some progression since prior study. Probable atelectasis in the left lung base.   Electronically Signed   By: Lucienne Capers M.D.   On: 08/24/2013 03:05   Dg Abd Portable 1v  08/24/2013   CLINICAL DATA:  Abdominal pain.  Blood in stools.  EXAM: PORTABLE ABDOMEN - 1 VIEW  COMPARISON:  CT abdomen and pelvis 12/1912.  FINDINGS: Examination is technically limited due to field of view. Portions of the abdomen and pelvis are not included. The bowel gas pattern is normal. No radio-opaque calculi or other significant radiographic abnormality  are seen.  IMPRESSION: No evidence of bowel obstruction.   Electronically Signed   By: Lucienne Capers M.D.   On: 08/24/2013 03:06   Dg Fluoro Guide Cv Line-no Report  08/28/2013   CLINICAL DATA: insertion of dialysis catheter   FLOURO GUIDE CV LINE  Fluoroscopy was utilized by the requesting physician.  No radiographic  interpretation.     Medications: Scheduled Meds: . acyclovir  400 mg Oral Daily  . amiodarone  200 mg Oral Daily  . benzonatate  100 mg Oral TID  . ceFEPime (MAXIPIME) IV  2 g Intravenous Q T,Th,Sa-HD  . darbepoetin (ARANESP) injection - DIALYSIS  60 mcg Intravenous Q Tue-HD  . feeding supplement (NEPRO CARB STEADY)  237 mL Oral Q24H  . ferric gluconate (FERRLECIT/NULECIT) IV  125 mg Intravenous Q T,Th,Sa-HD  . megestrol  800 mg Oral Daily  . midodrine  10 mg Oral TID WC  . multivitamin  1 tablet Oral QHS  . sodium chloride  3 mL Intravenous Q12H  . vancomycin  500 mg Oral 4 times per day  . vancomycin  750 mg Intravenous Q T,Th,Sa-HD      LOS: 11 days   RAI,RIPUDEEP M.D. [facility] 09/03/2013, 9:42 AM Pager: [phone]  If 7PM-7AM, please contact night-coverage [email or web address] Password [email or web address]  **Disclaimer: This note was dictated with voice recognition software. Similar sounding words can inadvertently be transcribed and this note may contain transcription errors which may not have been corrected upon publication of note.**

## 2013-09-04 ENCOUNTER — Inpatient Hospital Stay (HOSPITAL_COMMUNITY): Payer: Medicare HMO

## 2013-09-04 LAB — GLUCOSE, CAPILLARY
GLUCOSE-CAPILLARY: 68 mg/dL — AB (ref 70–99)
Glucose-Capillary: 86 mg/dL (ref 70–99)
Glucose-Capillary: 87 mg/dL (ref 70–99)
Glucose-Capillary: 109 mg/dL — ABNORMAL HIGH (ref 70–99)

## 2013-09-04 LAB — RENAL FUNCTION PANEL
Albumin: 1.9 g/dL — ABNORMAL LOW (ref 3.5–5.2)
BUN: 13 mg/dL (ref 6–23)
CO2: 30 meq/L (ref 19–32)
CREATININE: 2.74 mg/dL — AB (ref 0.50–1.35)
Calcium: 7.9 mg/dL — ABNORMAL LOW (ref 8.4–10.5)
Chloride: 99 mEq/L (ref 96–112)
GFR calc Af Amer: 25 mL/min — ABNORMAL LOW (ref >90)
GFR, EST NON AFRICAN AMERICAN: 22 mL/min — AB (ref >90)
Glucose, Bld: 78 mg/dL (ref 70–99)
Phosphorus: 2.7 mg/dL (ref 2.3–4.6)
Potassium: 3.7 mEq/L (ref 3.7–5.3)
Sodium: 138 mEq/L (ref 137–147)

## 2013-09-04 LAB — CBC
HEMATOCRIT: 26.5 % — AB (ref 39.0–52.0)
Hemoglobin: 8.5 g/dL — ABNORMAL LOW (ref 13.0–17.0)
MCH: 29.2 pg (ref 26.0–34.0)
MCHC: 32.1 g/dL (ref 30.0–36.0)
MCV: 91.1 fL (ref 78.0–100.0)
PLATELETS: 152 10*3/uL (ref 150–400)
RBC: 2.91 MIL/uL — AB (ref 4.22–5.81)
RDW: 16.3 % — ABNORMAL HIGH (ref 11.5–15.5)
WBC: 20.2 10*3/uL — AB (ref 4.0–10.5)

## 2013-09-04 NOTE — Progress Notes (Signed)
UR Completed Brenda Graves-Bigelow, RN,BSN [phone]  

## 2013-09-04 NOTE — Progress Notes (Signed)
Patient ID: Scott [name]  male  MCN:[id number]    DOB: Jul 21, 1940    DOA: 08/23/2013  PCP: Bufford Buttner, MD  Interim summary  73 y.o. male with history of multiple myeloma receiving chemotherapy every Tuesday (last tx day bfr admit), chronic kidney disease, anemia, and s/p pacemaker placement who had been experiencing diarrhea for one week. Patient had been having at least 4-5 episodes of watery diarrhea which was dark in color. Denied abdominal pain or nausea/vomiting. Patient became short of breath and also noticed swelling in his lower extremities.  In the ER at [facility] the patient's chest x-ray showed vascular congestion. Patient was found to be hypotensive and creatinine was found to be increased from baseline (as per our records patient's creatinine was around 3.5 last March). Patient was transferred to [facility] due to his worsening renal function. Patient was admitted to step down unit.  C. difficile was positive and patient was started on oral vancomycin. Nephrology was consulted (Dr Florene Glen, Dr Lorrene Reid) and AKI on CKD was thought likely related to ischemic ATN in the setting of volume depletion from diarrhea and hypotension. Due to no significant improvement in renal function, right IJ hemodialysis catheter was placed and dialysis was initiated on 08/28/13. Patient has been tolerating hemodialysis and has been approved for outpatient dialysis center. On 6/19 patient was found to have hypoxic respiratory failure in the setting of persistent pulmonary edema vs possible bilateral infiltrates,  patient was transferred back to step down unit and placed on BiPAP. He was also placed on IV antibiotics for assumed HCAP.  Difficult situation with the IV antibiotics and C. difficile colitis, patient has been having intractable diarrhea, flexiseal was placed. ID consult was obtained and patient was seen by Dr. Karolee Ohs who recommended 5 days sufficient for HCAP treatment. Plan is to DC  the IV antibiotics 6/22 to effectively treat C. difficile infection. Patient is also having significant anxiety issues hence xanax was started at that time as needed.  Dc home when cdiff diarrhea has improved.    Assessment/Plan:  Principal Problem:  Acute respiratory failure with Healthcare associated pneumonia/bilateral pneumonia chest x-ray/ leukocytosis - off BiPAP , did not need any BiPAP or CPAP last night, patient reports significant improvement in his symptoms and anxiety with xanax low dose. - Repeat chest x-ray today shows significant improvement . - Reviewed ID recommendations from Dr. Graylon Good, recommended 5 days of total IV antibiotics for pneumonia, DC IV antibiotics 6/22  C. difficile colitis :  patient is on IV antibiotics for pneumonia, difficult situation specially with worsening C. difficile colitis,  - Flexseal placed - Continue oral vancomycin, increase to 500 mg qid. Hopefully, will improve after IV antibiotics off.   Acute renal failure on chronic kidney disease  Baseline crt ~3.5 - likely related to ischemic ATN in setting of volume depletion and hypotension - care as per Nephrology - now s/p HD #1 on 6/15, 6/16, 6/18, 6/20 - Patient approved for outpatient dialysis center to start on 6/23  HD vascular access  - Status post right IJ hemodialysis catheter, Brachiobasilic AVF 09/11/45  - Vascular surgery following, Second stage right upper arm basilic vein transposition 6/54   Metabolic acidosis resolved, likely due to renal failure + profuse diarrhea, HD ongoing    Multiple myeloma  - Per nephrology, recently finished chemotherapy cycle, next cycle due but should be delayed due to acute infection   Multifactorial chronic anemia  no evidence of acute blood loss - labs confirm  signif Fe deficiency - load IV per Nephrology  Transfused one unit packed RBC yesterday   Severe Hypokalemia  Due to GI losses + poor intake,  currently improved   Thrombocytopenia  No  evidence of bleeding - follow trend , Currently improving   DVT Prophylaxis:  Code Status:  Family Communication: discussed with patient and his wife at the bedside  Disposition: cont to monitor in SDU today  Consultants:  Nephrology  Vascular surgery  Procedures:  Hemodialysis 6/11 - B LE venous dopplers - no evidence of DVT, superficial thrombosis, or Baker's Cyst  6/11 - TTE - EF 55-60% - no WMA - grade 3 DD  6/15 - RIJ hemodialysis catheter   Antibiotics:  Flagyl oral discontinued this 6/18  IV vancomycin, IV cefepime 6/18>>  Oral vancomycin   Subjective: Did not need BiPAP last night, slept better after Xanax, feels whole lot better, diarrhea still ongoing    Objective: Weight change: 0.9 kg (1 lb 15.8 oz)  Intake/Output Summary (Last 24 hours) at 09/04/13 0815 Last data filed at 09/03/13 2000  Gross per 24 hour  Intake    120 ml  Output      0 ml  Net    120 ml   Blood pressure 102/49, pulse 60, temperature 97.9 F (36.6 C), temperature source Oral, resp. rate 19, height 5' 5"  (1.651 m), weight 77.4 kg (170 lb 10.2 oz), SpO2 95.00%.  Physical Exam: General: Alert and awake, oriented x3, NAD CVS: S1-S2 clear, no murmur rubs or gallops Chest:  CTA B  Abdomen: soft nontender, nondistended, normal bowel sounds  Extremities: no cyanosis, clubbing or edema noted bilaterally    Lab Results: Basic Metabolic Panel:  Recent Labs Lab 09/03/13 0240 09/04/13 0240  NA 140 138  K 3.8 3.7  CL 101 99  CO2 30 30  GLUCOSE 71 78  BUN 7 13  CREATININE 1.73* 2.74*  CALCIUM 7.7* 7.9*  PHOS 1.9* 2.7   Liver Function Tests:  Recent Labs Lab 09/03/13 0240 09/04/13 0240  ALBUMIN 1.9* 1.9*   No results found for this basename: LIPASE, AMYLASE,  in the last 168 hours No results found for this basename: AMMONIA,  in the last 168 hours CBC:  Recent Labs Lab 09/03/13 0240 09/04/13 0240  WBC 20.6* 20.2*  HGB 8.4* 8.5*  HCT 26.0* 26.5*  MCV 90.3 91.1    PLT 143* 152   Cardiac Enzymes: No results found for this basename: CKTOTAL, CKMB, CKMBINDEX, TROPONINI,  in the last 168 hours BNP: No components found with this basename: POCBNP,  CBG:  Recent Labs Lab 09/02/13 2136 09/03/13 0808 09/03/13 1225 09/03/13 1638 09/03/13 2155  GLUCAP 106* 81 76 85 107*     Micro Results: No results found for this or any previous visit (from the past 240 hour(s)).  Studies/Results: Dg Chest 1 View  08/28/2013   CLINICAL DATA:  Central catheter placement  EXAM: CHEST - 1 VIEW  COMPARISON:  Study obtained earlier in the day  FINDINGS: Dual-lumen central catheter tip is at the cavoatrial junction. No pneumothorax. There is moderate generalized interstitial edema with patchy alveolar edema in the bases. Heart is mildly enlarged with mild pulmonary venous hypertension. No adenopathy.  IMPRESSION: Central catheter tip at cavoatrial junction. No pneumothorax. Underlying congestive heart failure. Superimposed pneumonia in the bases cannot be excluded radiographically.   Electronically Signed   By: Lowella Grip M.D.   On: 08/28/2013 12:28   Dg Chest Port 1 View  08/28/2013  CLINICAL DATA:  Shortness of breath.  EXAM: PORTABLE CHEST - 1 VIEW  COMPARISON:  Chest radiograph from 08/23/2013  FINDINGS: The lungs are well-aerated. Bibasilar airspace opacification may reflect multifocal pneumonia or pulmonary edema. Small bilateral pleural effusions are suspected, though the costophrenic angles are incompletely imaged on this study. No pneumothorax is seen.  The cardiomediastinal silhouette is borderline normal in size. A pacemaker is noted overlying the left chest wall, with leads ending overlying the right atrium and right ventricle. No acute osseous abnormalities are seen.  IMPRESSION: Bibasilar airspace opacification may reflect multifocal pneumonia or pulmonary edema. Suspect small bilateral pleural effusions.   Electronically Signed   By: Garald Balding M.D.    On: 08/28/2013 03:58   Dg Chest Port 1 View  08/24/2013   CLINICAL DATA:  Shortness of breath.  Infiltrates.  EXAM: PORTABLE CHEST - 1 VIEW  COMPARISON:  08/23/2013  FINDINGS: Two lead cardiac pacemaker without change in position. Cardiac enlargement with mild vascular congestion and interstitial edema. There seems to be mild progression since prior study. Atelectasis in the left lung base. No pneumothorax.  IMPRESSION: Cardiac enlargement with mild vascular congestion and interstitial edema, demonstrating some progression since prior study. Probable atelectasis in the left lung base.   Electronically Signed   By: Lucienne Capers M.D.   On: 08/24/2013 03:05   Dg Abd Portable 1v  08/24/2013   CLINICAL DATA:  Abdominal pain.  Blood in stools.  EXAM: PORTABLE ABDOMEN - 1 VIEW  COMPARISON:  CT abdomen and pelvis 12/1912.  FINDINGS: Examination is technically limited due to field of view. Portions of the abdomen and pelvis are not included. The bowel gas pattern is normal. No radio-opaque calculi or other significant radiographic abnormality are seen.  IMPRESSION: No evidence of bowel obstruction.   Electronically Signed   By: Lucienne Capers M.D.   On: 08/24/2013 03:06   Dg Fluoro Guide Cv Line-no Report  08/28/2013   CLINICAL DATA: insertion of dialysis catheter   FLOURO GUIDE CV LINE  Fluoroscopy was utilized by the requesting physician.  No radiographic  interpretation.     Medications: Scheduled Meds: . acyclovir  400 mg Oral Daily  . amiodarone  200 mg Oral Daily  . benzonatate  100 mg Oral TID  . ceFEPime (MAXIPIME) IV  2 g Intravenous Q T,Th,Sa-HD  . darbepoetin (ARANESP) injection - DIALYSIS  60 mcg Intravenous Q Tue-HD  . feeding supplement (NEPRO CARB STEADY)  237 mL Oral Q24H  . ferric gluconate (FERRLECIT/NULECIT) IV  125 mg Intravenous Q T,Th,Sa-HD  . megestrol  800 mg Oral Daily  . midodrine  10 mg Oral TID WC  . multivitamin  1 tablet Oral QHS  . sodium chloride  3 mL Intravenous  Q12H  . vancomycin  500 mg Oral 4 times per day  . vancomycin  750 mg Intravenous Q T,Th,Sa-HD      LOS: 12 days   RAI,RIPUDEEP M.D. [facility] 09/04/2013, 8:15 AM Pager: [phone]  If 7PM-7AM, please contact night-coverage [email or web address] Password [email or web address]  **Disclaimer: This note was dictated with voice recognition software. Similar sounding words can inadvertently be transcribed and this note may contain transcription errors which may not have been corrected upon publication of note.**

## 2013-09-04 NOTE — Progress Notes (Signed)
Pt did not want to wear CPAP tonight. Pt stated that he did fine without it last night and he would be ok tonight without it. Vitals are stable. RT will continue to monitor.

## 2013-09-04 NOTE — Care Management (Signed)
09-04-13 1024 Medicare Rights signed by patient, Copy given to pt and copy placed in shadow chart. Bethena Roys, RN,BSN [phone]

## 2013-09-04 NOTE — Clinical Social Work Psychosocial (Signed)
Clinical Social Work Department BRIEF PSYCHOSOCIAL ASSESSMENT 09/04/2013  Patient:  Scott [name], Scott [name]     Account Number:  [id number]     Admit date:  08/23/2013  Clinical Social Worker:  Lovey Newcomer  Date/Time:  09/04/2013 02:45 PM  Referred by:  Physician  Date Referred:  09/04/2013 Referred for  SNF Placement   Other Referral:   Interview type:  Patient Other interview type:   Patient and wife interviewed at bedside.    PSYCHOSOCIAL DATA Living Status:  WIFE Admitted from facility:   Level of care:   Primary support name:  Gurtej Noyola Primary support relationship to patient:  SPOUSE Degree of support available:   Support is good.    CURRENT CONCERNS Current Concerns  Post-Acute Placement   Other Concerns:    SOCIAL WORK ASSESSMENT / PLAN CSW met with patient and wife at bedside to complete assessment. Patient and wife are adamantly refusing SNF placement. Wife states, "He's not going into a nursing home. They are not going to be able to do anything for him that I cannot do at home. But I've been saying that they need to walk him because he hasn't walked since he's been here." CSW explained that this is likely why PT has recommended SNF for patient. Wife and patient still refuses placement. Patient is from home with wife and plans to return at discharge. Patient's wife appeared defensive when being approached about recommendation for SNF placement.   Assessment/plan status:  No Further Intervention Required Other assessment/ plan:   NONE   Information/referral to community resources:   NONE    PATIENT'S/FAMILY'S RESPONSE TO PLAN OF CARE: Patient and wife plan for a DC home. RNCM notified. CSW signing off at this time.       Liz Beach MSW, [place], [place], [phone]

## 2013-09-04 NOTE — Progress Notes (Signed)
Physical Therapy Treatment Patient Details Name: Scott [name] MRN: [id number] DOB: [birth year] Today's Date: 05-Sep-2013    History of Present Illness Pt. admitted from [facility] with a 1 1/2 week history of diarrhea and AKI on CKD stage IV.  Pt. with hypotension, respiratory distress and (+) c.diff.  Now ESRD and new to HD.  Is scheduled to begin OP HD on Tuesday 6/23 per wife.      PT Comments    Since PT evaluation, patient has moved to ICU due to respiratory distress.  Today patient agreed to exercises in bed.  Required frequent rest breaks due to dyspnea.  O2 sats remained in 90's with exercises.  Would recommend LTACH at discharge for continued therapy - patient refusing SNF.  Follow Up Recommendations  LTACH;Supervision/Assistance - 24 hour (Patient refusing SNF)     Equipment Recommendations  Hospital bed    Recommendations for Other Services       Precautions / Restrictions Precautions Precautions: Fall Precaution Comments: Pt. on contact precautions for c.diff Restrictions Weight Bearing Restrictions: No    Mobility  Bed Mobility Overal bed mobility: Needs Assistance Bed Mobility: Rolling Rolling: Min assist         General bed mobility comments: Minimal activity.  Patient fatigued.  Transfers                    Ambulation/Gait                 Stairs            Wheelchair Mobility    Modified Rankin (Stroke Patients Only)       Balance                                    Cognition Arousal/Alertness: Awake/alert Behavior During Therapy: WFL for tasks assessed/performed Overall Cognitive Status: Within Functional Limits for tasks assessed                      Exercises General Exercises - Upper Extremity Shoulder Flexion: AROM;Both;10 reps;Supine Shoulder Horizontal ABduction: AROM;Both;10 reps;Supine Shoulder Horizontal ADduction: AROM;Both;10 reps;Supine Elbow Flexion: AROM;Both;10  reps;Supine General Exercises - Lower Extremity Ankle Circles/Pumps: AROM;Both;10 reps;Supine Short Arc Quad: AROM;Both;10 reps;Supine Heel Slides: AROM;Both;10 reps;Supine Hip ABduction/ADduction: AROM;Both;10 reps;Supine Straight Leg Raises: AROM;Both;10 reps;Supine    General Comments        Pertinent Vitals/Pain     Home Living                      Prior Function            PT Goals (current goals can now be found in the care plan section) Progress towards PT goals: Progressing toward goals    Frequency  Min 3X/week    PT Plan Current plan remains appropriate    Co-evaluation             End of Session Equipment Utilized During Treatment: Oxygen Activity Tolerance: Patient limited by fatigue Patient left: in bed;with call bell/phone within reach;with bed alarm set     Time: 1216-1239 PT Time Calculation (min): 23 min  Charges:  $Therapeutic Exercise: 23-37 mins                    G Codes:      Despina Pole 09-05-13, 2:21 PM Carita Pian. Rosana Hoes, PT, MBA Acute Rehab  Services Pager [phone]

## 2013-09-04 NOTE — Care Management Note (Unsigned)
    Page 1 of 1   09/04/2013     3:32:02 PM CARE MANAGEMENT NOTE 09/04/2013  Patient:  Scott [name], Scott [name]   Account Number:  [id number]  Date Initiated:  08/25/2013  Documentation initiated by:  Marvetta Gibbons  Subjective/Objective Assessment:   Pt admitted with Acute resp. failure- cdiff, chf, renal failure     Action/Plan:   PTA pt lived at home- NCM to follow for d/c needs   Anticipated DC Date:  09/01/2013   Anticipated DC Plan:           Choice offered to / List presented to:             Status of service:  In process, will continue to follow Medicare Important Message given?  YES (If response is "NO", the following Medicare IM given date fields will be blank) Date Medicare IM given:  09/04/2013 Date Additional Medicare IM given:    Discharge Disposition:    Per UR Regulation:  Reviewed for med. necessity/level of care/duration of stay  If discussed at [place] of Stay Meetings, dates discussed:   08/29/2013  09/05/2013    Comments:  09-04-13 with multiple myeloma (currently on chemotherapy last received course on 08/22/13- Dialysis dependent since 6/15. Iv Abx transitioned to po. Plan for SNF placement once medically stable for d/c. Jacqlyn Krauss, RN,BSN [phone]  09-04-13 1024 Medicare Rights signed by patient, Copy given to pt and copy placed in shadow chart. Bethena Roys, [place] [phone]   08/28/13- 1600- Marvetta Gibbons RN, BSN [phone] HD cath placed today- with first HD treatment planned for today.

## 2013-09-04 NOTE — Progress Notes (Signed)
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. Subsequently found to be C. Diff + and on Rx fr that. AKI d/t volume depletion, hypotension and CDiff colitis on top of advanced CKD at baseline and has not recovered function.  Dialysis dependent since 6/15 and Dr. Florene Glen his primary nephrologist deemed him ESRD.   Assessment/Plan:  1. AKI on advancedCKD/ now with new ESRD- likely related to ischemic ATN in setting of volume depletion and hypotension.  New ESRD. Had [facility] placed 6/15 Dr. Donnetta Hutching.  CLIP  - has spot at [facility] TTS when ready for discharge. Next HD planned for Tuesday- tomorrow 2. Right brachiocephalic AVF - s/p second stage BVT  (6/19) Dr. Donnetta Hutching 3. C. Diff colitis- Still with substantial stool output.  Has been changed to high dose po vanco - not yet deemed a treatment failure with vanco (started on vanc - got changed to flagyl - escalation in diarrhea - back to vanco and ID recs 500 mg QID dosing for now) 4. Bilat PNA on CXR/HAP; cough with purulent sputum. Treating as HCAP - vanco/cefipime (6/18-6/22)- ID involved 5. Pulm edema - have been unable to effectively pull fluid last couple HD treatments d/t low BP;  6. Multiple myeloma- recently finished a cycle of chemo 6/9. Next cycle due but should be delayed with infx 7.  Anemia - Aranesp 60 QTues started; iron load .  Rec'd 1 unit PRBC's in HD 6/20 for Hb 9.2 8.  Bones - PTH 166 - within outpt goal range of 150-300 on no med at this time. Phos low (1.9) no binders 9.  Hypotension - BP's very soft.   Stopped tamsulosin.  Added midodrine to facilitate vol removal with HD but pt still with edema LE's and by CXR 6/18 10. Weakness/deconditoning - OT/PT  will be needed 11. Left sided PPM  Subjective: Still with loose black stool in flexiseal Still with episodic SOB that occurs primarily in the evenins, asking for BIPAP Right now says "feels all right"     Objective: Vital signs in last 24 hours: Temp:  [97.4 F (36.3 C)-98.1 F (36.7 C)] 97.4 F (36.3 C) (06/22 0730) Pulse Rate:  [53-64] 60 (06/22 0353) Resp:  [18-19] 19 (06/22 0353) BP: (94-103)/(49-73) 94/73 mmHg (06/22 0730) SpO2:  [95 %-99 %] 95 % (06/22 0353) Weight:  [77.4 kg (170 lb 10.2 oz)] 77.4 kg (170 lb 10.2 oz) (06/22 0353) Weight change: 0.9 kg (1 lb 15.8 oz)  Intake/Output from previous day: 06/21 0701 - 06/22 0700 In: 120 [P.O.:120] Out: -  Intake/Output this shift:   BP 94/73  Pulse 60  Temp(Src) 97.4 F (36.3 C) (Oral)  Resp 19  Ht 5' 5"  (1.651 m)  Wt 77.4 kg (170 lb 10.2 oz)  BMI 28.40 kg/m2  SpO2 95% Wearing O2 and currently breathing OK  Right TDC with dressing intact/non-tender Right AVF (2nd stage BVT done 6/19) with good bruit and wounds dry Lungs grossly clear anteriorly/reduced BS posteriorly with some base crackles S1S2 No S3 GI: soft, non-tender; bowel sounds normal; no masses,  no organomegaly Trace-1+ edema of LE's Flexiseal with watery stool  Recent Labs  09/03/13 0240 09/04/13 0240  WBC 20.6* 20.2*  HGB 8.4* 8.5*  HCT 26.0* 26.5*  PLT 143*  152   Results for AHLIJAH, RAIA (MRN [id number]) as of 08/29/2013 14:55  Ref. Range 08/29/2013 03:14  Iron Latest Range: 42-135 ug/dL <10 (L)  UIBC Latest Range: 125-400 ug/dL 90 (L)  TIBC Latest Range: 215-435 ug/dL Not calculated due to Iron <10.  Saturation Ratios Latest Range: 20-55 % Not calculated due to Iron <10.  Ferritin Latest Range: 22-322 ng/mL 812 (H)   Results for YOSHIO, SELIGA (MRN [id number]) as of 08/31/2013 12:41  Ref. Range 08/29/2013 03:14  PTH Latest Range: 14.0-72.0 pg/mL 166.5 (H)    Recent Labs  09/03/13 0240 09/04/13 0240  NA 140 138  K 3.8 3.7  CL 101  99  CO2 30 30  GLUCOSE 71 78  BUN 7 13  CREATININE 1.73* 2.74*  CALCIUM 7.7* 7.9*  Phos                               1.9                                  Studies/Results: Dg Chest 2 View  09/04/2013   CLINICAL DATA:  Pneumonia  EXAM: CHEST  2 VIEW  COMPARISON:  08/31/2013  FINDINGS: Cardiac shadow is stable. A pacing device and jugular dialysis catheter are again noted. Improved aeration is noted bilaterally with some persistent infiltrate in the left base.  IMPRESSION: Persistent left basilar changes.  Overall improved aeration when compare with the prior study.   Electronically Signed   By: Inez Catalina M.D.   On: 09/04/2013 08:03    Scheduled: . acyclovir  400 mg Oral Daily  . amiodarone  200 mg Oral Daily  . benzonatate  100 mg Oral TID  . darbepoetin (ARANESP) injection - DIALYSIS  60 mcg Intravenous Q Tue-HD  . feeding supplement (NEPRO CARB STEADY)  237 mL Oral Q24H  . ferric gluconate (FERRLECIT/NULECIT) IV  125 mg Intravenous Q T,Th,Sa-HD  . megestrol  800 mg Oral Daily  . midodrine  10 mg Oral TID WC  . multivitamin  1 tablet Oral QHS  . sodium chloride  3 mL Intravenous Q12H  . vancomycin  500 mg Oral 4 times per day     LOS: 12 days   Jaspreet Bodner A 09/04/2013,10:29 AM

## 2013-09-05 ENCOUNTER — Encounter (HOSPITAL_COMMUNITY): Payer: Self-pay | Admitting: Vascular Surgery

## 2013-09-05 DIAGNOSIS — E872 Acidosis, unspecified: Secondary | ICD-10-CM

## 2013-09-05 LAB — CBC
HCT: 27.4 % — ABNORMAL LOW (ref 39.0–52.0)
Hemoglobin: 9 g/dL — ABNORMAL LOW (ref 13.0–17.0)
MCH: 30.2 pg (ref 26.0–34.0)
MCHC: 32.8 g/dL (ref 30.0–36.0)
MCV: 91.9 fL (ref 78.0–100.0)
Platelets: 174 10*3/uL (ref 150–400)
RBC: 2.98 MIL/uL — ABNORMAL LOW (ref 4.22–5.81)
RDW: 16.3 % — AB (ref 11.5–15.5)
WBC: 18.8 10*3/uL — ABNORMAL HIGH (ref 4.0–10.5)

## 2013-09-05 LAB — RENAL FUNCTION PANEL
Albumin: 1.8 g/dL — ABNORMAL LOW (ref 3.5–5.2)
BUN: 18 mg/dL (ref 6–23)
CALCIUM: 7.9 mg/dL — AB (ref 8.4–10.5)
CO2: 29 meq/L (ref 19–32)
CREATININE: 3.56 mg/dL — AB (ref 0.50–1.35)
Chloride: 101 mEq/L (ref 96–112)
GFR calc Af Amer: 18 mL/min — ABNORMAL LOW (ref >90)
GFR calc non Af Amer: 16 mL/min — ABNORMAL LOW (ref >90)
GLUCOSE: 73 mg/dL (ref 70–99)
Phosphorus: 3.5 mg/dL (ref 2.3–4.6)
Potassium: 3.7 mEq/L (ref 3.7–5.3)
Sodium: 140 mEq/L (ref 137–147)

## 2013-09-05 LAB — GLUCOSE, CAPILLARY
GLUCOSE-CAPILLARY: 92 mg/dL (ref 70–99)
Glucose-Capillary: 87 mg/dL (ref 70–99)
Glucose-Capillary: 80 mg/dL (ref 70–99)

## 2013-09-05 MED ORDER — DARBEPOETIN ALFA-POLYSORBATE 60 MCG/0.3ML IJ SOLN
INTRAMUSCULAR | Status: AC
  Filled 2013-09-05: qty 0.3

## 2013-09-05 MED ORDER — HEPARIN SODIUM (PORCINE) 1000 UNIT/ML DIALYSIS
20.0000 [IU]/kg | INTRAMUSCULAR | Status: DC | PRN
  Filled 2013-09-05: qty 2

## 2013-09-05 NOTE — Progress Notes (Signed)
Pt called RN and requested cpap due to wob.

## 2013-09-05 NOTE — Progress Notes (Signed)
Patient ID: Scott [name]  male  KNL:[id number]    DOB: 16-Jun-1940    DOA: 08/23/2013  PCP: Bufford Buttner, MD  Interim summary  73 y.o. male with history of multiple myeloma receiving chemotherapy every Tuesday (last tx day bfr admit), chronic kidney disease, anemia, and s/p pacemaker placement who had been experiencing diarrhea for one week. Patient had been having at least 4-5 episodes of watery diarrhea which was dark in color. Denied abdominal pain or nausea/vomiting. Patient became short of breath and also noticed swelling in his lower extremities.  In the ER at [facility] the patient's chest x-ray showed vascular congestion. Patient was found to be hypotensive and creatinine was found to be increased from baseline (as per our records patient's creatinine was around 3.5 last March). Patient was transferred to [facility] due to his worsening renal function. Patient was admitted to step down unit.  C. difficile was positive and patient was started on oral vancomycin. Nephrology was consulted (Dr Florene Glen, Dr Lorrene Reid) and AKI on CKD was thought likely related to ischemic ATN in the setting of volume depletion from diarrhea and hypotension. Due to no significant improvement in renal function, right IJ hemodialysis catheter was placed and dialysis was initiated on 08/28/13. Patient has been tolerating hemodialysis and has been approved for outpatient dialysis center. On 6/19 patient was found to have hypoxic respiratory failure in the setting of persistent pulmonary edema vs possible bilateral infiltrates,  patient was transferred back to step down unit and placed on BiPAP. He was also placed on IV antibiotics for assumed HCAP.  Difficult situation with the IV antibiotics and C. difficile colitis, patient has been having intractable diarrhea, flexiseal was placed. ID consult was obtained and patient was seen by Dr. Karolee Ohs who recommended 5 days sufficient for HCAP treatment. IV  antibiotics discontinued on 6/22 to effectively treat C. difficile infection. Patient is also having significant anxiety issues hence xanax was started at that time as needed.  Dc home when cdiff diarrhea has improved, still having significant watery stools, flexiseal in.    Assessment/Plan:  Principal Problem:  Acute respiratory failure with Healthcare associated pneumonia/bilateral pneumonia chest x-ray/ leukocytosis - off BiPAP , did not need any BiPAP or CPAP last night, patient reports significant improvement in his symptoms and anxiety with xanax low dose. - Repeat chest x-ray 6/22 shows significant improvement . - Reviewed ID recommendations from Dr. Graylon Good, recommended 5 days of total IV antibiotics for pneumonia, discontinued IV antibiotics 6/22  C. difficile colitis : patient was on IV antibiotics for pneumonia, difficult situation specially with worsening C. difficile colitis,  - Flexseal placed, IV antibiotics discontinued 6/22 - Continue oral vancomycin, increase to 500 mg qid. Hopefully, will improve after IV antibiotics off. ID following, Dr. Graylon Good - WBC count is trending down   Acute renal failure on chronic kidney disease  Baseline crt ~3.5 - likely related to ischemic ATN in setting of volume depletion and hypotension - care as per Nephrology - now s/p HD #1 on 6/15, 6/16, 6/18, 6/20 - Patient approved for outpatient dialysis center, has spot at [facility] TTS when ready for discharge   HD vascular access  - Status post right IJ hemodialysis catheter, Brachiobasilic AVF 7/90/24  - Vascular surgery following, Second stage right upper arm basilic vein transposition 0/97   Metabolic acidosis resolved, likely due to renal failure + profuse diarrhea, HD ongoing    Multiple myeloma  - Per nephrology, recently finished chemotherapy cycle, next  cycle due but should be delayed due to acute infection   Multifactorial chronic anemia  no evidence of acute blood  loss - labs confirm signif Fe deficiency - load IV per Nephrology  Transfused one unit packed RBC 6/21  Severe Hypokalemia  Due to GI losses + poor intake,  currently improved   Thrombocytopenia  No evidence of bleeding - follow trend , Currently improving   DVT Prophylaxis:  Code Status:  Family Communication: discussed with patient at the bedside, discussed with wife in detail yesterday  Disposition: Will transfer to the renal floor after hemodialysis today  Consultants:  Nephrology  Vascular surgery  Procedures:  Hemodialysis 6/11 - B LE venous dopplers - no evidence of DVT, superficial thrombosis, or Baker's Cyst  6/11 - TTE - EF 55-60% - no WMA - grade 3 DD  6/15 - RIJ hemodialysis catheter   Antibiotics:  Flagyl oral discontinued this 6/18  IV vancomycin, IV cefepime 6/18>>  Oral vancomycin   Subjective: Patient has no complaints this morning, diarrhea still ongoing, significant output. Did not need the BiPAP last night   Objective: Weight change: -0.561 kg (-1 lb 3.8 oz)  Intake/Output Summary (Last 24 hours) at 09/05/13 1142 Last data filed at 09/05/13 0828  Gross per 24 hour  Intake    440 ml  Output    200 ml  Net    240 ml   Blood pressure 100/77, pulse 63, temperature 97.8 F (36.6 C), temperature source Oral, resp. rate 19, height 5' 5"  (1.651 m), weight 76.839 kg (169 lb 6.4 oz), SpO2 95.00%.  Physical Exam: General: Alert and awake, oriented x3, NAD CVS: S1-S2 clear, no murmur rubs or gallops Chest:  CTA B  Abdomen: soft nontender, nondistended, normal bowel sounds  Extremities: no cyanosis, clubbing or edema noted bilaterally flexiseal +    Lab Results: Basic Metabolic Panel:  Recent Labs Lab 09/03/13 0240 09/04/13 0240  NA 140 138  K 3.8 3.7  CL 101 99  CO2 30 30  GLUCOSE 71 78  BUN 7 13  CREATININE 1.73* 2.74*  CALCIUM 7.7* 7.9*  PHOS 1.9* 2.7   Liver Function Tests:  Recent Labs Lab 09/03/13 0240 09/04/13 0240    ALBUMIN 1.9* 1.9*   No results found for this basename: LIPASE, AMYLASE,  in the last 168 hours No results found for this basename: AMMONIA,  in the last 168 hours CBC:  Recent Labs Lab 09/04/13 0240 09/05/13 0435  WBC 20.2* 18.8*  HGB 8.5* 9.0*  HCT 26.5* 27.4*  MCV 91.1 91.9  PLT 152 174   Cardiac Enzymes: No results found for this basename: CKTOTAL, CKMB, CKMBINDEX, TROPONINI,  in the last 168 hours BNP: No components found with this basename: POCBNP,  CBG:  Recent Labs Lab 09/04/13 0739 09/04/13 1056 09/04/13 1637 09/04/13 2138 09/05/13 0747  GLUCAP 68* 86 87 109* 87     Micro Results: No results found for this or any previous visit (from the past 240 hour(s)).  Studies/Results: Dg Chest 1 View  08/28/2013   CLINICAL DATA:  Central catheter placement  EXAM: CHEST - 1 VIEW  COMPARISON:  Study obtained earlier in the day  FINDINGS: Dual-lumen central catheter tip is at the cavoatrial junction. No pneumothorax. There is moderate generalized interstitial edema with patchy alveolar edema in the bases. Heart is mildly enlarged with mild pulmonary venous hypertension. No adenopathy.  IMPRESSION: Central catheter tip at cavoatrial junction. No pneumothorax. Underlying congestive heart failure. Superimposed pneumonia in the bases  cannot be excluded radiographically.   Electronically Signed   By: Lowella Grip M.D.   On: 08/28/2013 12:28   Dg Chest Port 1 View  08/28/2013   CLINICAL DATA:  Shortness of breath.  EXAM: PORTABLE CHEST - 1 VIEW  COMPARISON:  Chest radiograph from 08/23/2013  FINDINGS: The lungs are well-aerated. Bibasilar airspace opacification may reflect multifocal pneumonia or pulmonary edema. Small bilateral pleural effusions are suspected, though the costophrenic angles are incompletely imaged on this study. No pneumothorax is seen.  The cardiomediastinal silhouette is borderline normal in size. A pacemaker is noted overlying the left chest wall, with leads  ending overlying the right atrium and right ventricle. No acute osseous abnormalities are seen.  IMPRESSION: Bibasilar airspace opacification may reflect multifocal pneumonia or pulmonary edema. Suspect small bilateral pleural effusions.   Electronically Signed   By: Garald Balding M.D.   On: 08/28/2013 03:58   Dg Chest Port 1 View  08/24/2013   CLINICAL DATA:  Shortness of breath.  Infiltrates.  EXAM: PORTABLE CHEST - 1 VIEW  COMPARISON:  08/23/2013  FINDINGS: Two lead cardiac pacemaker without change in position. Cardiac enlargement with mild vascular congestion and interstitial edema. There seems to be mild progression since prior study. Atelectasis in the left lung base. No pneumothorax.  IMPRESSION: Cardiac enlargement with mild vascular congestion and interstitial edema, demonstrating some progression since prior study. Probable atelectasis in the left lung base.   Electronically Signed   By: Lucienne Capers M.D.   On: 08/24/2013 03:05   Dg Abd Portable 1v  08/24/2013   CLINICAL DATA:  Abdominal pain.  Blood in stools.  EXAM: PORTABLE ABDOMEN - 1 VIEW  COMPARISON:  CT abdomen and pelvis 12/1912.  FINDINGS: Examination is technically limited due to field of view. Portions of the abdomen and pelvis are not included. The bowel gas pattern is normal. No radio-opaque calculi or other significant radiographic abnormality are seen.  IMPRESSION: No evidence of bowel obstruction.   Electronically Signed   By: Lucienne Capers M.D.   On: 08/24/2013 03:06   Dg Fluoro Guide Cv Line-no Report  08/28/2013   CLINICAL DATA: insertion of dialysis catheter   FLOURO GUIDE CV LINE  Fluoroscopy was utilized by the requesting physician.  No radiographic  interpretation.     Medications: Scheduled Meds: . acyclovir  400 mg Oral Daily  . amiodarone  200 mg Oral Daily  . benzonatate  100 mg Oral TID  . darbepoetin (ARANESP) injection - DIALYSIS  60 mcg Intravenous Q Tue-HD  . ferric gluconate (FERRLECIT/NULECIT) IV   125 mg Intravenous Q T,Th,Sa-HD  . megestrol  800 mg Oral Daily  . midodrine  10 mg Oral TID WC  . multivitamin  1 tablet Oral QHS  . sodium chloride  3 mL Intravenous Q12H  . vancomycin  500 mg Oral 4 times per day      LOS: 13 days   RAI,RIPUDEEP M.D. [facility] 09/05/2013, 11:42 AM Pager: [phone]  If 7PM-7AM, please contact night-coverage [email or web address] Password [email or web address]  **Disclaimer: This note was dictated with voice recognition software. Similar sounding words can inadvertently be transcribed and this note may contain transcription errors which may not have been corrected upon publication of note.**

## 2013-09-05 NOTE — Progress Notes (Addendum)
NUTRITION FOLLOW UP  DOCUMENTATION CODES Per approved criteria  -Severe malnutrition in the context of chronic illness   INTERVENTION:  D/C Nepro Shake daily  Continue RENA-VIT daily RD to follow for nutrition care plan  NUTRITION DIAGNOSIS: Malnutrition related to inadequate oral intake as evidenced by severe depletion of muscle mass with intake </= 75% of estimated energy requirement, ongoing  Goal: Intake to meet >90% of estimated nutrition needs, progressing  Monitor:  PO & supplemental intake, weight, labs, I/O's  ASSESSMENT: 73 y.o. male with history of multiple myeloma and chemotherapy every Tuesday last one was yesterday, chronic kidney disease, anemia, pacemaker placement has been experiencing diarrhea for last one week. Patient has been having at least 4-5 episodes of watery diarrhea which was dark in color. Denies any abdominal pain or nausea vomiting. In the ER at [facility] patient's chest x-ray showed vascular congestion.  Dialysis catheter placed 6/15. Received first HD treatment 6/15. CLIP process initiated.    Nephrology note reviewed 6/22 -- pt with new ESRD.  Patient reports he's eating fairly.  Complaining we are giving him too many eggs at breakfast.  PO intake variable at 40-75% per flowsheet records.  Refusing his Nepro Shake daily.  RD to discontinue.  States he doesn't care to try any other nutrition supplement options.  Noted patient on Megace.  Height: Ht Readings from Last 1 Encounters:  09/01/13 5' 5"  (1.651 m)    Weight: Wt Readings from Last 1 Encounters:  09/05/13 169 lb 6.4 oz (76.839 kg)  Admit wt 151 lb  BMI:  Body mass index is 28.19 kg/(m^2).   Estimated Nutritional Needs: Kcal: 1800-2000 Protein: 90-100 gm Fluid: 1.2 L  Skin: stage 2 pressure ulcer to sacrum  Diet Order: Renal W/1200 ml fluid restriction    Intake/Output Summary (Last 24 hours) at 09/05/13 1111 Last data filed at 09/05/13 0828  Gross per 24 hour   Intake    440 ml  Output    200 ml  Net    240 ml    Labs:   Recent Labs Lab 09/02/13 1441 09/03/13 0240 09/04/13 0240  NA 140 140 138  K 3.4* 3.8 3.7  CL 98 101 99  CO2 33* 30 30  BUN 18 7 13   CREATININE 3.04* 1.73* 2.74*  CALCIUM 7.7* 7.7* 7.9*  PHOS 3.5 1.9* 2.7  GLUCOSE 87 71 78    CBG (last 3)   Recent Labs  09/04/13 1637 09/04/13 2138 09/05/13 0747  GLUCAP 87 109* 87    Scheduled Meds: . acyclovir  400 mg Oral Daily  . amiodarone  200 mg Oral Daily  . benzonatate  100 mg Oral TID  . darbepoetin (ARANESP) injection - DIALYSIS  60 mcg Intravenous Q Tue-HD  . feeding supplement (NEPRO CARB STEADY)  237 mL Oral Q24H  . ferric gluconate (FERRLECIT/NULECIT) IV  125 mg Intravenous Q T,Th,Sa-HD  . megestrol  800 mg Oral Daily  . midodrine  10 mg Oral TID WC  . multivitamin  1 tablet Oral QHS  . sodium chloride  3 mL Intravenous Q12H  . vancomycin  500 mg Oral 4 times per day    Continuous Infusions:    Arthur Holms, RD, LDN Pager #: [phone] After-Hours Pager #: [phone]

## 2013-09-05 NOTE — Progress Notes (Signed)
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 Subsequently found to be C. Diff + and on Rx fr that. AKI d/t volume depletion, hypotension and CDiff colitis on top of advanced CKD at baseline and has not recovered function.  Dialysis dependent since 6/15 and Dr. Florene Glen his primary nephrologist deemed him ESRD.   Assessment/Plan:  1. AKI on advancedCKD/ now with new ESRD- likely related to ischemic ATN in setting of volume depletion and hypotension.  New ESRD. Had [facility] placed 6/15 Dr. Donnetta Hutching.  CLIP  - has spot at [facility] TTS when ready for discharge. Next HD planned for Tuesday- today 2. Right brachiocephalic AVF - s/p second stage BVT  (6/19) Dr. Donnetta Hutching 3. C. Diff colitis- Still with substantial stool output.  Has been changed to high dose po vanco - not yet deemed a treatment failure with vanco (started on vanc - got changed to flagyl - escalation in diarrhea - back to vanco and ID recs 500 mg QID dosing for now) 4. Bilat PNA on CXR/HAP; cough with purulent sputum. Treating as HCAP - vanco/cefipime (6/18-6/22)- ID involved 5. Pulm edema - have been unable to effectively pull fluid last couple HD treatments d/t low BP;  6. Multiple myeloma- recently finished a cycle of chemo 6/9. Next cycle due but should be delayed with infx 7.  Anemia - Aranesp 60 QTues started; iron load ongoing .  Rec'd 1 unit PRBC's in HD 6/20 for Hb 9.2 8.  Bones - PTH 166 - within outpt goal range of 150-300 on no med at this time. Phos low (1.9) no binders- on renavite 9.  Hypotension - BP's very soft.   Stopped tamsulosin.  Added midodrine to facilitate vol removal with HD but pt still with edema LE's and by CXR 6/18 10.  Weakness/deconditoning - OT/PT will be needed 11. Left sided PPM  Subjective: Still with loose black stool in flexiseal Still with episodic SOB that occurs primarily in the evening Right now says "feels all right"     Objective: Vital signs in last 24 hours: Temp:  [97.8 F (36.6 C)-98 F (36.7 C)] 97.8 F (36.6 C) (06/23 0750) Pulse Rate:  [57-63] 63 (06/23 0750) Resp:  [18-19] 19 (06/23 0750) BP: (95-115)/(53-77) 100/77 mmHg (06/23 0750) SpO2:  [94 %-98 %] 95 % (06/23 0750) Weight:  [76.839 kg (169 lb 6.4 oz)] 76.839 kg (169 lb 6.4 oz) (06/23 0353) Weight change: -0.561 kg (-1 lb 3.8 oz)  Intake/Output from previous day: 06/22 0701 - 06/23 0700 In: 480 [P.O.:480] Out: 200 [Urine:200] Intake/Output this shift: Total I/O In: 200 [P.O.:200] Out: -  BP 100/77  Pulse 63  Temp(Src) 97.8 F (36.6 C) (Oral)  Resp 19  Ht 5' 5" (1.651 m)  Wt 76.839 kg (169 lb 6.4 oz)  BMI 28.19 kg/m2  SpO2 95% Wearing O2 and currently breathing OK  Right TDC with dressing intact/non-tender Right AVF (2nd stage BVT done 6/19) with good bruit and wounds dry Lungs grossly clear anteriorly/reduced BS posteriorly with some base crackles S1S2 No S3 GI: soft, non-tender; bowel sounds normal; no masses,  no organomegaly Trace-1+ edema of LE's Flexiseal with watery stool  Recent Labs  09/04/13 0240 09/05/13 0435  WBC 20.2* 18.8*  HGB 8.5* 9.0*  HCT 26.5* 27.4*  PLT 152 174   Results for STANLEY, LYNESS (MRN [id number]) as of 08/29/2013 14:55  Ref. Range 08/29/2013 03:14  Iron Latest Range: 42-135 ug/dL <10 (L)  UIBC Latest Range: 125-400 ug/dL 90 (L)  TIBC Latest Range: 215-435 ug/dL Not calculated due to Iron <10.  Saturation Ratios Latest Range: 20-55 % Not calculated due to Iron <10.  Ferritin Latest Range: 22-322 ng/mL 812 (H)   Results for MISAEL, MCGAHA (MRN [id number]) as of 08/31/2013 12:41  Ref. Range 08/29/2013 03:14  PTH Latest Range: 14.0-72.0 pg/mL 166.5 (H)    Recent  Labs  09/03/13 0240 09/04/13 0240  NA 140 138  K 3.8 3.7  CL 101 99  CO2 30 30  GLUCOSE 71 78  BUN 7 13  CREATININE 1.73* 2.74*  CALCIUM 7.7* 7.9*  Phos                               1.9                                  Studies/Results: Dg Chest 2 View  09/04/2013   CLINICAL DATA:  Pneumonia  EXAM: CHEST  2 VIEW  COMPARISON:  08/31/2013  FINDINGS: Cardiac shadow is stable. A pacing device and jugular dialysis catheter are again noted. Improved aeration is noted bilaterally with some persistent infiltrate in the left base.  IMPRESSION: Persistent left basilar changes.  Overall improved aeration when compare with the prior study.   Electronically Signed   By: Inez Catalina M.D.   On: 09/04/2013 08:03    Scheduled: . acyclovir  400 mg Oral Daily  . amiodarone  200 mg Oral Daily  . benzonatate  100 mg Oral TID  . darbepoetin (ARANESP) injection - DIALYSIS  60 mcg Intravenous Q Tue-HD  . feeding supplement (NEPRO CARB STEADY)  237 mL Oral Q24H  . ferric gluconate (FERRLECIT/NULECIT) IV  125 mg Intravenous Q T,Th,Sa-HD  . megestrol  800 mg Oral Daily  . midodrine  10 mg Oral TID WC  . multivitamin  1 tablet Oral QHS  . sodium chloride  3 mL Intravenous Q12H  . vancomycin  500 mg Oral 4 times per day     LOS: 13 days   GOLDSBOROUGH,KELLIE A 09/05/2013,10:35 AM

## 2013-09-05 NOTE — Progress Notes (Signed)
Pt tx [facility] per MD order, pt VSS, pt and family verbalized understanding of tx, report called to receiving RN, all questions answered

## 2013-09-05 NOTE — Progress Notes (Signed)
PT cpap order QHS PRN, and pt has been refusing. RT spoke with pt that said  'I'm fine how I am'.

## 2013-09-05 NOTE — Procedures (Signed)
Patient was seen on dialysis and the procedure was supervised.  BFR 350  Via PC BP is  109/53 .   Patient appears to be tolerating treatment well  Scott [name] A 09/05/2013

## 2013-09-06 LAB — GLUCOSE, CAPILLARY
GLUCOSE-CAPILLARY: 91 mg/dL (ref 70–99)
Glucose-Capillary: 81 mg/dL (ref 70–99)
Glucose-Capillary: 86 mg/dL (ref 70–99)
Glucose-Capillary: 134 mg/dL — ABNORMAL HIGH (ref 70–99)

## 2013-09-06 LAB — CBC
HEMATOCRIT: 27.4 % — AB (ref 39.0–52.0)
HEMOGLOBIN: 8.9 g/dL — AB (ref 13.0–17.0)
MCH: 29.2 pg (ref 26.0–34.0)
MCHC: 32.5 g/dL (ref 30.0–36.0)
MCV: 89.8 fL (ref 78.0–100.0)
Platelets: 179 10*3/uL (ref 150–400)
RBC: 3.05 MIL/uL — ABNORMAL LOW (ref 4.22–5.81)
RDW: 16.2 % — ABNORMAL HIGH (ref 11.5–15.5)
WBC: 14.9 10*3/uL — ABNORMAL HIGH (ref 4.0–10.5)

## 2013-09-06 LAB — BASIC METABOLIC PANEL
BUN: 8 mg/dL (ref 6–23)
CALCIUM: 7.8 mg/dL — AB (ref 8.4–10.5)
CO2: 28 mEq/L (ref 19–32)
CREATININE: 2.24 mg/dL — AB (ref 0.50–1.35)
Chloride: 101 mEq/L (ref 96–112)
GFR calc non Af Amer: 28 mL/min — ABNORMAL LOW (ref >90)
GFR, EST AFRICAN AMERICAN: 32 mL/min — AB (ref >90)
GLUCOSE: 69 mg/dL — AB (ref 70–99)
Potassium: 3.9 mEq/L (ref 3.7–5.3)
Sodium: 140 mEq/L (ref 137–147)

## 2013-09-06 NOTE — Progress Notes (Signed)
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 Subsequently found to be C. Diff + and on Rx fr that. AKI d/t volume depletion, hypotension and CDiff colitis on top of advanced CKD at baseline and has not recovered function.  Dialysis dependent since 6/15 and Dr. Florene Glen his primary nephrologist deemed him ESRD.   Assessment/Plan:  1. AKI on advancedCKD/ now with new ESRD- likely related to ischemic ATN in setting of volume depletion and hypotension.  New ESRD. Had [facility] placed 6/15 Dr. Donnetta Hutching.  CLIP  - has spot at [facility] TTS when ready for discharge. Next HD planned for Thursday 2. Right brachiocephalic AVF - s/p second stage BVT  (6/19) Dr. Donnetta Hutching 3. C. Diff colitis- Still with substantial stool output.  Has been changed to high dose po vanco - not yet deemed a treatment failure with vanco (started on vanc - got changed to flagyl - escalation in diarrhea - back to vanco and ID recs 500 mg QID dosing for now)- stool seems to be slowing down 4. Bilat PNA on CXR/HAP; cough with purulent sputum. Treating as HCAP - abx complete 5. Pulm edema - have been unable to effectively pull fluid last couple HD treatments d/t low BP; did better yesterday- no O2 6. Multiple myeloma- recently finished a cycle of chemo 6/9. Next cycle due but should be delayed with infx 7.  Anemia - Aranesp 60 QTues started; iron load ongoing .  Rec'd 1 unit PRBC's in HD 6/20  8.  Bones - PTH 166 - within outpt goal range of 150-300 on no med at this time. Phos low (1.9) no binders- on renavite 9.  Hypotension - BP's very soft.   Stopped tamsulosin.  Added midodrine to facilitate vol removal with HD but pt still with edema LE's and by CXR 6/18 10.  Weakness/deconditoning - OT/PT will be needed- I would think needs SNF- family non comittal 11. Left sided PPM  Subjective: Diarrhea slowed Tolerated dialysis pretty well- but did have some low BP which quickly resolved Right now says "feels all right"- is grumpy today     Objective: Vital signs in last 24 hours: Temp:  [97.6 F (36.4 C)-98.8 F (37.1 C)] 98.8 F (37.1 C) (06/24 0520) Pulse Rate:  [59-98] 59 (06/24 0520) Resp:  [15-28] 18 (06/24 0520) BP: (84-125)/(45-86) 110/58 mmHg (06/24 0520) SpO2:  [87 %-98 %] 97 % (06/24 0520) Weight:  [72.6 kg (160 lb 0.9 oz)-72.831 kg (160 lb 9 oz)] 72.831 kg (160 lb 9 oz) (06/23 1928) Weight change: -4.239 kg (-9 lb 5.5 oz)  Intake/Output from previous day: 06/23 0701 - 06/24 0700 In: 800 [P.O.:800] Out: 1831 [Urine:200] Intake/Output this shift:   BP 110/58  Pulse 59  Temp(Src) 98.8 F (37.1 C) (Oral)  Resp 18  Ht _0  (1.651 m)  Wt 72.831 kg (160 lb 9 oz)  BMI 26.72 kg/m2  SpO2 97% Wearing O2 and currently breathing OK  Right TDC with dressing intact/non-tender Right AVF (2nd stage BVT done 6/19) with good bruit and wounds dry Lungs grossly clear anteriorly/reduced BS posteriorly with some base crackles S1S2 No S3 GI: soft, non-tender; bowel sounds normal; no masses,  no organomegaly Trace  edema of LE's Flexiseal with watery stool  Recent Labs  09/05/13 0435 09/06/13 0530  WBC 18.8* 14.9*  HGB 9.0* 8.9*  HCT 27.4* 27.4*  PLT 174 179   Results for Scott, [name] (MRN [id number]) as of 08/29/2013 14:55  Ref. Range 08/29/2013 03:14  Iron Latest Range: 42-135 ug/dL <10 (L)  UIBC Latest Range: 125-400 ug/dL 90 (L)  TIBC Latest Range: 215-435 ug/dL Not calculated due to Iron <10.  Saturation Ratios Latest Range: 20-55 % Not calculated due to Iron <10.  Ferritin Latest Range: 22-322 ng/mL 812 (H)   Results for Scott, [name] (MRN [id number]) as of 08/31/2013 12:41  Ref. Range 08/29/2013 03:14  PTH Latest Range:  14.0-72.0 pg/mL 166.5 (H)    Recent Labs  09/05/13 1350 09/06/13 0530  NA 140 140  K 3.7 3.9  CL 101 101  CO2 29 28  GLUCOSE 73 69*  BUN 18 8  CREATININE 3.56* 2.24*  CALCIUM 7.9* 7.8*  Phos                               1.9                                  Studies/Results: No results found.  Scheduled: . acyclovir  400 mg Oral Daily  . amiodarone  200 mg Oral Daily  . benzonatate  100 mg Oral TID  . darbepoetin (ARANESP) injection - DIALYSIS  60 mcg Intravenous Q Tue-HD  . ferric gluconate (FERRLECIT/NULECIT) IV  125 mg Intravenous Q T,Th,Sa-HD  . megestrol  800 mg Oral Daily  . midodrine  10 mg Oral TID WC  . multivitamin  1 tablet Oral QHS  . sodium chloride  3 mL Intravenous Q12H  . vancomycin  500 mg Oral 4 times per day     LOS: 14 days   GOLDSBOROUGH,KELLIE A 09/06/2013,11:38 AM

## 2013-09-06 NOTE — Progress Notes (Signed)
While participating in physical therapy, patient 's flexiseal was dislodged. Patient stool is still loose but no longer watery

## 2013-09-06 NOTE — Progress Notes (Signed)
[facility] PROGRESS NOTE  Scott [name] IHW:[id number] DOB: Mar 22, 1940 DOA: 08/23/2013 PCP: Bufford Buttner, MD  Assessment/Plan: Active Problems:   Multiple myeloma   HCAP (healthcare-associated pneumonia)   Pacemaker   Acute respiratory failure   Renal failure (ARF), acute on chronic   Diarrhea   Hypokalemia   Anemia   C. difficile colitis  PCP: Bufford Buttner, MD  Interim summary  73 y.o. male with history of multiple myeloma receiving chemotherapy every Tuesday (last tx day bfr admit), chronic kidney disease, anemia, and s/p pacemaker placement who had been experiencing diarrhea for one week. Patient had been having at least 4-5 episodes of watery diarrhea which was dark in color. Denied abdominal pain or nausea/vomiting. Patient became short of breath and also noticed swelling in his lower extremities.  In the ER at [facility] the patient's chest x-ray showed vascular congestion. Patient was found to be hypotensive and creatinine was found to be increased from baseline (as per our records patient's creatinine was around 3.5 last March). Patient was transferred to [facility] due to his worsening renal function. Patient was admitted to step down unit.  C. difficile was positive and patient was started on oral vancomycin. Nephrology was consulted (Dr Florene Glen, Dr Lorrene Reid) and AKI on CKD was thought likely related to ischemic ATN in the setting of volume depletion from diarrhea and hypotension.  Due to no significant improvement in renal function, right IJ hemodialysis catheter was placed and dialysis was initiated on 08/28/13. Patient has been tolerating hemodialysis and has been approved for outpatient dialysis center.  On 6/19 patient was found to have hypoxic respiratory failure in the setting of persistent pulmonary edema vs possible bilateral infiltrates, patient was transferred back to step down unit and placed on BiPAP. He was also placed on IV antibiotics for assumed  HCAP.  Difficult situation with the IV antibiotics and C. difficile colitis, patient has been having intractable diarrhea, flexiseal was placed. ID consult was obtained and patient was seen by Dr. Karolee Ohs who recommended 5 days sufficient for HCAP treatment.  IV antibiotics discontinued on 6/22 to effectively treat C. difficile infection. Patient is also having significant anxiety issues hence xanax was started at that time as needed.  Dc home when cdiff diarrhea has improved, still having significant watery stools, flexiseal in.    Assessment/Plan:  Principal Problem:  Acute respiratory failure with Healthcare associated pneumonia/bilateral pneumonia chest x-ray/ leukocytosis  - off BiPAP , , patient reports significant improvement in his symptoms and anxiety with xanax low dose.  - Repeat chest x-ray 6/22 shows significant improvement .  - Reviewed ID recommendations from Dr. Graylon Good, recommended 5 days of total IV antibiotics for pneumonia, discontinued IV antibiotics 6/22   Hypo-tension Stopped Flomax Added midodrine to facilitate vol removal with HD but pt still with edema in his lower extremities   C. difficile colitis : patient was on IV antibiotics for pneumonia, - We'll discontinue flexiseal  in the morning if diarrhea significantly improved  IV antibiotics discontinued 6/22  - Continue oral vancomycin, infectious disease recommended vancomycin 500 mg qid x10 days.  Will start tapering vancomycin slowly in a.m.  Acute renal failure on chronic kidney disease  Baseline crt ~3.5 - likely related to ischemic ATN in setting of volume depletion and hypotension - care as per Nephrology - now s/p HD #1 on 6/15, 6/16, 6/18, 6/20  - Patient approved for outpatient dialysis center, has spot at [facility] TTS when ready for discharge  HD vascular access  - Status post right IJ hemodialysis catheter, Brachiobasilic AVF 0/63/01  - Vascular surgery following, Second stage  right upper arm basilic vein transposition 6/19 by Dr. early  Metabolic acidosis resolved, likely due to renal failure + profuse diarrhea, HD ongoing   Multiple myeloma  - Per nephrology, recently finished chemotherapy cycle, next cycle due but should be delayed due to acute infection    Multifactorial chronic anemia  no evidence of acute blood loss - labs confirm signif Fe deficiency - load IV per Nephrology  Transfused one unit packed RBC 6/21    Severe Hypokalemia  Due to GI losses + poor intake, currently improved    Thrombocytopenia  No evidence of bleeding - follow trend , Currently improving    DVT Prophylaxis:  Code Status:  Family Communication: Refusing to go to SNF Disposition: Refusing to go to SNF possibly discharge home tomorrow with home health  Consultants:  Nephrology  Vascular surgery Procedures:  Hemodialysis 6/11 - B LE venous dopplers - no evidence of DVT, superficial thrombosis, or Baker's Cyst  6/11 - TTE - EF 55-60% - no WMA - grade 3 DD  6/15 - RIJ hemodialysis catheter  Antibiotics:  Flagyl oral discontinued this 6/18  IV vancomycin, IV cefepime 6/18>>  Oral vancomycin     HPI/Subjective: Appears lethargic, but arousable awake and conversive, noted to have low blood pressure during hemodialysis  Objective: Filed Vitals:   09/05/13 1928 09/05/13 2220 09/06/13 0520 09/06/13 0858  BP: 100/60  110/58 118/64  Pulse: 63 68 59 64  Temp: 98.1 F (36.7 C)  98.8 F (37.1 C) 98 F (36.7 C)  TempSrc: Oral  Oral Oral  Resp: 20 19 18 18   Height:      Weight: 72.831 kg (160 lb 9 oz)     SpO2: 94% 95% 97% 98%    Intake/Output Summary (Last 24 hours) at 09/06/13 1633 Last data filed at 09/06/13 1138  Gross per 24 hour  Intake    360 ml  Output   2081 ml  Net  -1721 ml    Exam: General: Alert and awake, oriented x3, NAD  CVS: S1-S2 clear, no murmur rubs or gallops  Chest: CTA B  Abdomen: soft nontender, nondistended, normal bowel sounds   Extremities: no cyanosis, clubbing or edema noted bilaterally  flexiseal +        Data Reviewed: Basic Metabolic Panel:  Recent Labs Lab 09/01/13 0400 09/02/13 1441 09/03/13 0240 09/04/13 0240 09/05/13 1350 09/06/13 0530  NA 142 140 140 138 140 140  K 4.1 3.4* 3.8 3.7 3.7 3.9  CL 102 98 101 99 101 101  CO2 30 33* 30 30 29 28   GLUCOSE 105* 87 71 78 73 69*  BUN 12 18 7 13 18 8   CREATININE 1.80* 3.04* 1.73* 2.74* 3.56* 2.24*  CALCIUM 7.6* 7.7* 7.7* 7.9* 7.9* 7.8*  PHOS 2.6 3.5 1.9* 2.7 3.5  --     Liver Function Tests:  Recent Labs Lab 09/01/13 0400 09/02/13 1441 09/03/13 0240 09/04/13 0240 09/05/13 1350  ALBUMIN 2.0* 1.9* 1.9* 1.9* 1.8*   No results found for this basename: LIPASE, AMYLASE,  in the last 168 hours No results found for this basename: AMMONIA,  in the last 168 hours  CBC:  Recent Labs Lab 09/02/13 1441 09/03/13 0240 09/04/13 0240 09/05/13 0435 09/06/13 0530  WBC 20.0* 20.6* 20.2* 18.8* 14.9*  HGB 7.2* 8.4* 8.5* 9.0* 8.9*  HCT 22.3* 26.0* 26.5* 27.4* 27.4*  MCV  90.7 90.3 91.1 91.9 89.8  PLT 144* 143* 152 174 179    Cardiac Enzymes: No results found for this basename: CKTOTAL, CKMB, CKMBINDEX, TROPONINI,  in the last 168 hours BNP (last 3 results)  Recent Labs  06/08/13 2145  PROBNP 16467.0*     CBG:  Recent Labs Lab 09/04/13 2138 09/05/13 0747 09/05/13 1201 09/05/13 1817 09/06/13 0825  GLUCAP 109* 87 92 80 81    No results found for this or any previous visit (from the past 240 hour(s)).   Studies: Dg Chest 1 View  08/28/2013   CLINICAL DATA:  Central catheter placement  EXAM: CHEST - 1 VIEW  COMPARISON:  Study obtained earlier in the day  FINDINGS: Dual-lumen central catheter tip is at the cavoatrial junction. No pneumothorax. There is moderate generalized interstitial edema with patchy alveolar edema in the bases. Heart is mildly enlarged with mild pulmonary venous hypertension. No adenopathy.  IMPRESSION: Central  catheter tip at cavoatrial junction. No pneumothorax. Underlying congestive heart failure. Superimposed pneumonia in the bases cannot be excluded radiographically.   Electronically Signed   By: Lowella Grip M.D.   On: 08/28/2013 12:28   Dg Chest 2 View  09/04/2013   CLINICAL DATA:  Pneumonia  EXAM: CHEST  2 VIEW  COMPARISON:  08/31/2013  FINDINGS: Cardiac shadow is stable. A pacing device and jugular dialysis catheter are again noted. Improved aeration is noted bilaterally with some persistent infiltrate in the left base.  IMPRESSION: Persistent left basilar changes.  Overall improved aeration when compare with the prior study.   Electronically Signed   By: Inez Catalina M.D.   On: 09/04/2013 08:03   Dg Chest 2 View  08/30/2013   CLINICAL DATA:  Multiple myeloma and pneumonia.  EXAM: CHEST  2 VIEW  COMPARISON:  August 28, 2013 11:56 a.m.  FINDINGS: The heart size and mediastinal contours are stable. The heart size is enlarged. Cardiac pacemaker and dual lumen right central venous line are unchanged. There are consolidation of bilateral lung bases with small bilateral pleural effusions. There is mild central pulmonary vascular congestion. The visualized skeletal structures are stable.  IMPRESSION: Bibasilar pneumonias with small bilateral pleural effusions. Mild pulmonary vascular congestion.   Electronically Signed   By: Abelardo Diesel M.D.   On: 08/30/2013 17:28   Dg Chest Port 1 View  09/01/2013   CLINICAL DATA:  Cough.  Short of breath.  EXAM: PORTABLE CHEST - 1 VIEW  COMPARISON:  08/30/2013.  08/28/2013.  FINDINGS: Right IJ dialysis catheter is present, unchanged. Two lead left subclavian pacemaker leads are also unchanged there is moderate volume overload/ CHF. Dense retrocardiac opacity likely representing a combination of effusion, atelectasis and edema. Bilateral basilar airspace disease and atelectasis is present. The airspace disease is compatible with pulmonary edema.  When comparing to the  prior chest radiograph of 08/28/2013, pulmonary edema is minimally improved.  IMPRESSION: Mildly improved volume overload/ CHF, still moderate. Prominent basilar opacity represents a combination of pleural effusions, atelectasis and airspace disease/edema  These results were called by telephone at the time of interpretation on 09/01/2013 at 12:57 AM to Dr. Chaney Malling , who verbally acknowledged these results.   Electronically Signed   By: Dereck Ligas M.D.   On: 09/01/2013 00:57   Dg Chest Port 1 View  08/28/2013   CLINICAL DATA:  Shortness of breath.  EXAM: PORTABLE CHEST - 1 VIEW  COMPARISON:  Chest radiograph from 08/23/2013  FINDINGS: The lungs are well-aerated. Bibasilar airspace opacification may  reflect multifocal pneumonia or pulmonary edema. Small bilateral pleural effusions are suspected, though the costophrenic angles are incompletely imaged on this study. No pneumothorax is seen.  The cardiomediastinal silhouette is borderline normal in size. A pacemaker is noted overlying the left chest wall, with leads ending overlying the right atrium and right ventricle. No acute osseous abnormalities are seen.  IMPRESSION: Bibasilar airspace opacification may reflect multifocal pneumonia or pulmonary edema. Suspect small bilateral pleural effusions.   Electronically Signed   By: Garald Balding M.D.   On: 08/28/2013 03:58   Dg Chest Port 1 View  08/24/2013   CLINICAL DATA:  Shortness of breath.  Infiltrates.  EXAM: PORTABLE CHEST - 1 VIEW  COMPARISON:  08/23/2013  FINDINGS: Two lead cardiac pacemaker without change in position. Cardiac enlargement with mild vascular congestion and interstitial edema. There seems to be mild progression since prior study. Atelectasis in the left lung base. No pneumothorax.  IMPRESSION: Cardiac enlargement with mild vascular congestion and interstitial edema, demonstrating some progression since prior study. Probable atelectasis in the left lung base.   Electronically  Signed   By: Lucienne Capers M.D.   On: 08/24/2013 03:05   Dg Abd Portable 1v  08/24/2013   CLINICAL DATA:  Abdominal pain.  Blood in stools.  EXAM: PORTABLE ABDOMEN - 1 VIEW  COMPARISON:  CT abdomen and pelvis 12/1912.  FINDINGS: Examination is technically limited due to field of view. Portions of the abdomen and pelvis are not included. The bowel gas pattern is normal. No radio-opaque calculi or other significant radiographic abnormality are seen.  IMPRESSION: No evidence of bowel obstruction.   Electronically Signed   By: Lucienne Capers M.D.   On: 08/24/2013 03:06   Dg Fluoro Guide Cv Line-no Report  08/28/2013   CLINICAL DATA: insertion of dialysis catheter   FLOURO GUIDE CV LINE  Fluoroscopy was utilized by the requesting physician.  No radiographic  interpretation.     Scheduled Meds: . acyclovir  400 mg Oral Daily  . amiodarone  200 mg Oral Daily  . benzonatate  100 mg Oral TID  . darbepoetin (ARANESP) injection - DIALYSIS  60 mcg Intravenous Q Tue-HD  . ferric gluconate (FERRLECIT/NULECIT) IV  125 mg Intravenous Q T,Th,Sa-HD  . megestrol  800 mg Oral Daily  . midodrine  10 mg Oral TID WC  . multivitamin  1 tablet Oral QHS  . sodium chloride  3 mL Intravenous Q12H  . vancomycin  500 mg Oral 4 times per day   Continuous Infusions:   Active Problems:   Multiple myeloma   HCAP (healthcare-associated pneumonia)   Pacemaker   Acute respiratory failure   Renal failure (ARF), acute on chronic   Diarrhea   Hypokalemia   Anemia   C. difficile colitis    Time spent: 40 minutes   [facility] Pager [phone]. If 8PM-8AM, please contact night-coverage at [email or web address], password [facility] 09/06/2013, 4:33 PM  LOS: 14 days

## 2013-09-06 NOTE — Progress Notes (Addendum)
Physical Therapy Treatment Patient Details Name: CONARD ALVIRA MRN: [id number] DOB: 1940/11/30 Today's Date: 09/06/2013    History of Present Illness Pt. admitted from [facility] with a 1 1/2 week history of diarrhea and AKI on CKD stage IV.  Pt. with hypotension, respiratory distress and (+) c.diff.  Now ESRD and new to HD.  Is scheduled to begin OP HD on Tuesday 6/23 per wife.  transferred to ICU due to respiratiry distress; back to renal floor 6/23    PT Comments    Pt with continued weakness/deconditioning significantly effecting activity tolerance/mobility , and ability to stand; He seems to have a better understanding/awareness of his current functional level, and the need to get stronger in order to safely dc home -- he is slightly more open to the idea of SNF for rehab; He did not outright agree, but is coming around to the idea (wife is agreeable to SNF)  We need to work on increasing sitting tolerance/activity tolerance in prep for OP HD; Recommend pt get OOB daily; Have paged MD for pressure redistribution cushion for his recliner; At this time, recommend mechanical lift for transfers   Follow Up Recommendations  SNF;LTACH;Supervision/Assistance - 24 hour     Equipment Recommendations  Hospital bed;Wheelchair cushion (measurements PT)    Recommendations for Other Services       Precautions / Restrictions Precautions Precautions: Fall Precaution Comments: Pt. on contact precautions for c.diff    Mobility  Bed Mobility Overal bed mobility: Needs Assistance Bed Mobility: Supine to Sit     Supine to sit: +2 for physical assistance;Mod assist     General bed mobility comments: Cues for technique; inefficient movement, quite taxing; required assist to raise shoulders into sitting position  Transfers Overall transfer level: Needs assistance Equipment used: Rolling walker (2 wheeled);2 person hand held assist Transfers: Sit to/from Stand;Squat Pivot  Transfers Sit to Stand: +2 physical assistance;Max assist   Squat pivot transfers: +2 physical assistance;Total assist     General transfer comment: Requiring +2 assist for all upright activity due to bil LE and trunk weakness; Unable to fully extend bil knees in [place] position with standing attempts; Basic pivot to chair with trunk support given at gait belt and shoulder girdle with bil knees blocked  Ambulation/Gait             General Gait Details: Unable   Stairs            Wheelchair Mobility    Modified Rankin (Stroke Patients Only)       Balance Overall balance assessment: Needs assistance Sitting-balance support: Feet supported;Bilateral upper extremity supported Sitting balance-Leahy Scale: Fair Sitting balance - Comments: sits with trunk flexed and with post pelvic tilt     Standing balance-Leahy Scale: Zero Standing balance comment: Unable to extend knees with standing attempts                    Cognition Arousal/Alertness: Awake/alert Behavior During Therapy: WFL for tasks assessed/performed Overall Cognitive Status: Within Functional Limits for tasks assessed                      Exercises      General Comments General comments (skin integrity, edema, etc.): Flexi-seal came out during transfer; RN notified      Pertinent Vitals/Pain no apparent distress Just quite fatigued    Home Living  Prior Function            PT Goals (current goals can now be found in the care plan section) Acute Rehab PT Goals Patient Stated Goal: Wants to be able to get on his feet and go home PT Goal Formulation: With patient Time For Goal Achievement: 09/14/13 Potential to Achieve Goals: Fair Progress towards PT goals: Progressing toward goals (Quite slowly)    Frequency  Min 3X/week    PT Plan Discharge plan needs to be updated (Pt slightly more amenable to SNF for rehab)    Co-evaluation              End of Session Equipment Utilized During Treatment: Gait belt;Oxygen Activity Tolerance: Patient limited by fatigue Patient left: in chair;with call bell/phone within reach;with chair alarm set;with family/visitor present     Time: 2119-4174 PT Time Calculation (min): 46 min  Charges:  $Therapeutic Activity: 38-52 mins                    G Codes:      Roney Marion Hamff 09/06/2013, 10:20 AM  Roney Marion, PT  Acute Rehabilitation Services Pager [phone] Office [phone]

## 2013-09-07 DIAGNOSIS — I770 Arteriovenous fistula, acquired: Secondary | ICD-10-CM

## 2013-09-07 LAB — BASIC METABOLIC PANEL
BUN: 12 mg/dL (ref 6–23)
CHLORIDE: 99 meq/L (ref 96–112)
CO2: 29 mEq/L (ref 19–32)
Calcium: 8 mg/dL — ABNORMAL LOW (ref 8.4–10.5)
Creatinine, Ser: 3.06 mg/dL — ABNORMAL HIGH (ref 0.50–1.35)
GFR calc Af Amer: 22 mL/min — ABNORMAL LOW (ref >90)
GFR, EST NON AFRICAN AMERICAN: 19 mL/min — AB (ref >90)
GLUCOSE: 69 mg/dL — AB (ref 70–99)
POTASSIUM: 3.9 meq/L (ref 3.7–5.3)
SODIUM: 138 meq/L (ref 137–147)

## 2013-09-07 LAB — CBC
HEMATOCRIT: 26 % — AB (ref 39.0–52.0)
HEMOGLOBIN: 8.3 g/dL — AB (ref 13.0–17.0)
MCH: 28.9 pg (ref 26.0–34.0)
MCHC: 31.9 g/dL (ref 30.0–36.0)
MCV: 90.6 fL (ref 78.0–100.0)
Platelets: 199 10*3/uL (ref 150–400)
RBC: 2.87 MIL/uL — ABNORMAL LOW (ref 4.22–5.81)
RDW: 16.2 % — AB (ref 11.5–15.5)
WBC: 14.4 10*3/uL — ABNORMAL HIGH (ref 4.0–10.5)

## 2013-09-07 LAB — GLUCOSE, CAPILLARY
Glucose-Capillary: 73 mg/dL (ref 70–99)
Glucose-Capillary: 90 mg/dL (ref 70–99)
Glucose-Capillary: 97 mg/dL (ref 70–99)

## 2013-09-07 MED ORDER — IPRATROPIUM-ALBUTEROL 0.5-2.5 (3) MG/3ML IN SOLN: 3.0000 mL | RESPIRATORY_TRACT | Status: DC | PRN

## 2013-09-07 MED ORDER — MIDODRINE HCL 10 MG PO TABS: 10.0000 mg | ORAL_TABLET | Freq: Three times a day (TID) | ORAL | Status: AC

## 2013-09-07 MED ORDER — GUAIFENESIN-CODEINE 100-10 MG/5ML PO SOLN: 5.0000 mL | Freq: Four times a day (QID) | ORAL | Status: DC | PRN

## 2013-09-07 MED ORDER — HEPARIN SODIUM (PORCINE) 1000 UNIT/ML DIALYSIS: 20.0000 [IU]/kg | INTRAMUSCULAR | Status: DC | PRN

## 2013-09-07 MED ORDER — TRAMADOL HCL 50 MG PO TABS
50.0000 mg | ORAL_TABLET | Freq: Four times a day (QID) | ORAL | Status: DC | PRN
Start: 2013-09-07 — End: 2013-11-25

## 2013-09-07 MED ORDER — VANCOMYCIN 50 MG/ML ORAL SOLUTION: ORAL | Status: DC

## 2013-09-07 NOTE — Clinical Social Work Note (Signed)
Patient now agreeable to ST rehab. Information sent out to facilities in [facility] for Friday discharge.  Crawford Givens, MSW, LCSW [phone]

## 2013-09-07 NOTE — Progress Notes (Signed)
Pt refuses CPAP for the night. RT encouraged pt to call if he changes his mind. RT will continue to monitor.

## 2013-09-07 NOTE — Progress Notes (Signed)
Leukocytosis steadily improving. Diarrhea mildly improving.   A: recurrent cdifficile:   Would treat with high dose vancomycing 500mg  QID x 10days (currently day 6 of 10), then taper to 125mg  QID x 5 days, then 125mg  TID x 5 days, then 125mg  BID x 5 days, then 125mg  daily x 5 days then stop.  Will sign off. Call if questions. If he has recurrent cdifficile after this taper, can refer to [facility] for evaluation for fecal transplant  Caren Griffins B. [facility] for Infectious Diseases [phone]

## 2013-09-07 NOTE — Procedures (Signed)
Patient was seen on dialysis and the procedure was supervised.  BFR 400  Via PC BP is  107/57.   Patient appears to be tolerating treatment well  GOLDSBOROUGH,KELLIE A 09/07/2013

## 2013-09-07 NOTE — Progress Notes (Signed)
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 Subsequently found to be C. Diff + and on Rx fr that.  Dialysis dependent since 6/15 and Dr. Florene Glen his primary nephrologist deemed him ESRD.   Assessment/Plan:  1. AKI on advancedCKD/ now with new ESRD- likely related to ischemic ATN in setting of volume depletion and hypotension.  New ESRD. Had [facility] placed 6/15 Dr. Donnetta Hutching.  CLIP  - has spot at [facility] TTS when ready for discharge. Next HD planned for today 2. Right brachiocephalic AVF - s/p second stage BVT  (6/19) Dr. Donnetta Hutching 3. C. Diff colitis- Still with substantial stool output.  Has been changed to high dose po vanco - not yet deemed a treatment failure with vanco (started on vanc - got changed to flagyl - escalation in diarrhea - back to vanco and ID recs 500 mg QID dosing for now)- stool seems to be slowing down 4. Bilat PNA on CXR/HAP; cough with purulent sputum. Treating as HCAP - abx complete 5. Pulm edema - have been unable to effectively pull fluid last couple HD treatments d/t low BP; done better lately- no O2 6. Multiple myeloma- recently finished a cycle of chemo 6/9. Next cycle due but should be delayed with infx 7.  Anemia - Aranesp 60 QTues started; iron load ongoing.  Will need more aranesp at discharge.  Rec'd 1 unit PRBC's in HD 6/20  8.  Bones - PTH 166 - within outpt goal range of 150-300 on no med at this time. Phos low (1.9) no binders- on renavite 9.  Hypotension - BP's very soft.   Stopped tamsulosin.  Added midodrine to facilitate vol removal with HD but pt still with edema LE's and by CXR 6/18 10. Weakness/deconditoning - OT/PT will be needed- apparently family refusing SNF, possible  discharge home today ?  He says if up to his wife- he may be having concerns about negotiating the steps to his house 11. Left sided PPM  Subjective: Diarrhea slowed Possible discharge today ?   Objective: Vital signs in last 24 hours: Temp:  [98.1 F (36.7 C)-98.7 F (37.1 C)] 98.1 F (36.7 C) (06/25 0722) Pulse Rate:  [60-72] 60 (06/25 0900) Resp:  [18] 18 (06/25 0722) BP: (104-120)/(55-72) 107/57 mmHg (06/25 0900) SpO2:  [96 %-100 %] 96 % (06/25 0722) Weight:  [72.5 kg (159 lb 13.3 oz)-72.831 kg (160 lb 9 oz)] 72.5 kg (159 lb 13.3 oz) (06/25 0722) Weight change: 0.231 kg (8.1 oz)  Intake/Output from previous day: 06/24 0701 - 06/25 0700 In: 600 [P.O.:600] Out: 730 [Urine:730] Intake/Output this shift:   BP 107/57  Pulse 60  Temp(Src) 98.1 F (36.7 C) (Oral)  Resp 18  Ht 5' 5"  (1.651 m)  Wt 72.5 kg (159 lb 13.3 oz)  BMI 26.60 kg/m2  SpO2 96% Wearing O2 and currently breathing OK  Right TDC with dressing intact/non-tender Right AVF (2nd stage BVT done 6/19) with good bruit and wounds dry Lungs grossly clear anteriorly/reduced BS posteriorly with some base crackles S1S2 No S3 GI: soft, non-tender; bowel sounds normal; no masses,  no organomegaly Trace  edema of LE's Flexiseal with watery stool  Recent Labs  09/06/13 0530 09/07/13 0745  WBC 14.9* 14.4*  HGB 8.9* 8.3*  HCT  27.4* 26.0*  PLT 179 199   Results for FERRELL, CLAIBORNE (MRN [id number]) as of 08/29/2013 14:55  Ref. Range 08/29/2013 03:14  Iron Latest Range: 42-135 ug/dL <10 (L)  UIBC Latest Range: 125-400 ug/dL 90 (L)  TIBC Latest Range: 215-435 ug/dL Not calculated due to Iron <10.  Saturation Ratios Latest Range: 20-55 % Not calculated due to Iron <10.  Ferritin Latest Range: 22-322 ng/mL 812 (H)   Results for HAZIM, TREADWAY (MRN [id number]) as of 08/31/2013 12:41  Ref. Range 08/29/2013 03:14  PTH Latest Range: 14.0-72.0 pg/mL 166.5 (H)    Recent Labs  09/06/13 0530 09/07/13 0449  NA 140 138  K  3.9 3.9  CL 101 99  CO2 28 29  GLUCOSE 69* 69*  BUN 8 12  CREATININE 2.24* 3.06*  CALCIUM 7.8* 8.0*  Phos                               1.9                                  Studies/Results: No results found.  Scheduled: . acyclovir  400 mg Oral Daily  . amiodarone  200 mg Oral Daily  . benzonatate  100 mg Oral TID  . darbepoetin (ARANESP) injection - DIALYSIS  60 mcg Intravenous Q Tue-HD  . ferric gluconate (FERRLECIT/NULECIT) IV  125 mg Intravenous Q T,Th,Sa-HD  . megestrol  800 mg Oral Daily  . midodrine  10 mg Oral TID WC  . multivitamin  1 tablet Oral QHS  . sodium chloride  3 mL Intravenous Q12H  . vancomycin  500 mg Oral 4 times per day     LOS: 15 days   GOLDSBOROUGH,KELLIE A 09/07/2013,9:56 AM

## 2013-09-07 NOTE — Progress Notes (Signed)
[facility] PROGRESS NOTE  Scott [name] LDJ:[id number] DOB: [birth year] DOA: 08/23/2013 PCP: Bufford Buttner, MD  Assessment/Plan: Active Problems:   Multiple myeloma   HCAP (healthcare-associated pneumonia)   Pacemaker   Acute respiratory failure   Renal failure (ARF), acute on chronic   Diarrhea   Hypokalemia   Anemia   C. difficile colitis  Interim summary  73 y.o. male with history of multiple myeloma receiving chemotherapy every Tuesday (last tx day bfr admit), chronic kidney disease, anemia, and s/p pacemaker placement who had been experiencing diarrhea for one week. Patient had been having at least 4-5 episodes of watery diarrhea which was dark in color. Denied abdominal pain or nausea/vomiting. Patient became short of breath and also noticed swelling in his lower extremities.  In the ER at [facility] the patient's chest x-ray showed vascular congestion. Patient was found to be hypotensive and creatinine was found to be increased from baseline (as per our records patient's creatinine was around 3.5 last March). Patient was transferred to [facility] due to his worsening renal function. Patient was admitted to step down unit.  C. difficile was positive and patient was started on oral vancomycin. Nephrology was consulted (Dr Florene Glen, Dr Lorrene Reid) and AKI on CKD was thought likely related to ischemic ATN in the setting of volume depletion from diarrhea and hypotension.  Due to no significant improvement in renal function, right IJ hemodialysis catheter was placed and dialysis was initiated on 08/28/13. Patient has been tolerating hemodialysis and has been approved for outpatient dialysis center.  On 6/19 patient was found to have hypoxic respiratory failure in the setting of persistent pulmonary edema vs possible bilateral infiltrates, patient was transferred back to step down unit and placed on BiPAP. He was also placed on IV antibiotics for assumed HCAP.  Difficult situation  with the IV antibiotics and C. difficile colitis, patient has been having intractable diarrhea, flexiseal was placed. ID consult was obtained and patient was seen by Dr. Karolee Ohs who recommended 5 days sufficient for HCAP treatment.  IV antibiotics discontinued on 6/22 to effectively treat C. difficile infection. Patient is also having significant anxiety issues hence xanax was started at that time as needed.  Dc home when cdiff diarrhea has improved, still having significant watery stools, flexiseal     Assessment/Plan:  Principal Problem:  Acute respiratory failure with Healthcare associated pneumonia/bilateral pneumonia chest x-ray/ leukocytosis  - off BiPAP , , patient reports significant improvement in his symptoms and anxiety with xanax low dose.  - Repeat chest x-ray 6/22 shows significant improvement .  - Reviewed ID recommendations from Dr. Graylon Good, recommended 5 days of total IV antibiotics for pneumonia, discontinued IV antibiotics 6/22    Hypo-tension  Stopped Flomax  Added midodrine to facilitate vol removal with HD but pt still with edema in his lower extremities    C. difficile colitis : patient was on IV antibiotics for pneumonia,  - We'll discontinue flexiseal in the morning if diarrhea significantly improved  IV antibiotics discontinued 6/22  Would treat with high dose vancomycing 52m QID x 10days (currently day 6 of 10), then taper to 1273mQID x 5 days, then 12537mID x 5 days, then 125m63mD x 5 days, then 125mg63mly x 5 days then stop .     Acute renal failure on chronic kidney disease  Baseline crt ~3.5 - likely related to ischemic ATN in setting of volume depletion and hypotension - care as per Nephrology - now s/p HD #1 on  6/15, 6/16, 6/18, 6/20  - Patient approved for outpatient dialysis center, has spot at [facility] TTS when ready for discharge    HD vascular access  - Status post right IJ hemodialysis catheter, Brachiobasilic AVF  9/83/38  - Vascular surgery following, Second stage right upper arm basilic vein transposition 6/19 by Dr. early   Metabolic acidosis resolved, likely due to renal failure + profuse diarrhea, HD ongoing    Multiple myeloma  - Per nephrology, recently finished chemotherapy cycle, next cycle due but should be delayed due to acute infection    Multifactorial chronic anemia  no evidence of acute blood loss - labs confirm signif Fe deficiency -  Aranesp 60 QTues started; iron load ongoing. Will need more aranesp at discharge. Rec'd 1 unit PRBC's in HD 6/20     Severe Hypokalemia  Due to GI losses + poor intake, currently improved    Thrombocytopenia  No evidence of bleeding - follow trend , Currently improving  DVT Prophylaxis:  Code Status:  Family Communication: Requesting SNF Disposition: Right requesting SNF     Consultants:  Nephrology  Vascular surgery Procedures:  Hemodialysis 6/11 - B LE venous dopplers - no evidence of DVT, superficial thrombosis, or Baker's Cyst  6/11 - TTE - EF 55-60% - no WMA - grade 3 DD  6/15 - RIJ hemodialysis catheter  Antibiotics:  Flagyl oral discontinued this 6/18  IV vancomycin, IV cefepime 6/18>>  Oral vancomycin        HPI/Subjective: Watching television, no complaint: Diarrhea improved, wants to go to SNF  Objective: Filed Vitals:   09/07/13 1000 09/07/13 1030 09/07/13 1100 09/07/13 1126  BP: 98/51 102/53 97/53 100/54  Pulse: 64 61 61 60  Temp:    98.3 F (36.8 C)  TempSrc:    Oral  Resp:    17  Height:      Weight:    70.5 kg (155 lb 6.8 oz)  SpO2:    94%    Intake/Output Summary (Last 24 hours) at 09/07/13 1626 Last data filed at 09/07/13 1300  Gross per 24 hour  Intake    360 ml  Output   2580 ml  Net  -2220 ml    Exam:  General: Alert and awake, oriented x3, NAD  CVS: S1-S2 clear, no murmur rubs or gallops  Chest: CTA B  Abdomen: soft nontender, nondistended, normal bowel sounds  Extremities: no  cyanosis, clubbing or edema noted bilaterally  flexiseal +    Data Reviewed: Basic Metabolic Panel:  Recent Labs Lab 09/01/13 0400 09/02/13 1441 09/03/13 0240 09/04/13 0240 09/05/13 1350 09/06/13 0530 09/07/13 0449  NA 142 140 140 138 140 140 138  K 4.1 3.4* 3.8 3.7 3.7 3.9 3.9  CL 102 98 101 99 101 101 99  CO2 30 33* _0 GLUCOSE 105* 87 71 78 73 69* 69*  BUN _1 CREATININE 1.80* 3.04* 1.73* 2.74* 3.56* 2.24* 3.06*  CALCIUM 7.6* 7.7* 7.7* 7.9* 7.9* 7.8* 8.0*  PHOS 2.6 3.5 1.9* 2.7 3.5  --   --     Liver Function Tests:  Recent Labs Lab 09/01/13 0400 09/02/13 1441 09/03/13 0240 09/04/13 0240 09/05/13 1350  ALBUMIN 2.0* 1.9* 1.9* 1.9* 1.8*   No results found for this basename: LIPASE, AMYLASE,  in the last 168 hours No results found for this basename: AMMONIA,  in the last 168 hours  CBC:  Recent Labs Lab 09/03/13 0240  09/04/13 0240 09/05/13 0435 09/06/13 0530 09/07/13 0745  WBC 20.6* 20.2* 18.8* 14.9* 14.4*  HGB 8.4* 8.5* 9.0* 8.9* 8.3*  HCT 26.0* 26.5* 27.4* 27.4* 26.0*  MCV 90.3 91.1 91.9 89.8 90.6  PLT 143* 152 174 179 199    Cardiac Enzymes: No results found for this basename: CKTOTAL, CKMB, CKMBINDEX, TROPONINI,  in the last 168 hours BNP (last 3 results)  Recent Labs  06/08/13 2145  PROBNP 16467.0*     CBG:  Recent Labs Lab 09/06/13 0825 09/06/13 1205 09/06/13 1635 09/06/13 2111 09/07/13 1157  GLUCAP 81 91 86 134* 73    No results found for this or any previous visit (from the past 240 hour(s)).   Studies: Dg Chest 1 View  08/28/2013   CLINICAL DATA:  Central catheter placement  EXAM: CHEST - 1 VIEW  COMPARISON:  Study obtained earlier in the day  FINDINGS: Dual-lumen central catheter tip is at the cavoatrial junction. No pneumothorax. There is moderate generalized interstitial edema with patchy alveolar edema in the bases. Heart is mildly enlarged with mild pulmonary venous hypertension. No  adenopathy.  IMPRESSION: Central catheter tip at cavoatrial junction. No pneumothorax. Underlying congestive heart failure. Superimposed pneumonia in the bases cannot be excluded radiographically.   Electronically Signed   By: Lowella Grip M.D.   On: 08/28/2013 12:28   Dg Chest 2 View  09/04/2013   CLINICAL DATA:  Pneumonia  EXAM: CHEST  2 VIEW  COMPARISON:  08/31/2013  FINDINGS: Cardiac shadow is stable. A pacing device and jugular dialysis catheter are again noted. Improved aeration is noted bilaterally with some persistent infiltrate in the left base.  IMPRESSION: Persistent left basilar changes.  Overall improved aeration when compare with the prior study.   Electronically Signed   By: Inez Catalina M.D.   On: 09/04/2013 08:03   Dg Chest 2 View  08/30/2013   CLINICAL DATA:  Multiple myeloma and pneumonia.  EXAM: CHEST  2 VIEW  COMPARISON:  August 28, 2013 11:56 a.m.  FINDINGS: The heart size and mediastinal contours are stable. The heart size is enlarged. Cardiac pacemaker and dual lumen right central venous line are unchanged. There are consolidation of bilateral lung bases with small bilateral pleural effusions. There is mild central pulmonary vascular congestion. The visualized skeletal structures are stable.  IMPRESSION: Bibasilar pneumonias with small bilateral pleural effusions. Mild pulmonary vascular congestion.   Electronically Signed   By: Abelardo Diesel M.D.   On: 08/30/2013 17:28   Dg Chest Port 1 View  09/01/2013   CLINICAL DATA:  Cough.  Short of breath.  EXAM: PORTABLE CHEST - 1 VIEW  COMPARISON:  08/30/2013.  08/28/2013.  FINDINGS: Right IJ dialysis catheter is present, unchanged. Two lead left subclavian pacemaker leads are also unchanged there is moderate volume overload/ CHF. Dense retrocardiac opacity likely representing a combination of effusion, atelectasis and edema. Bilateral basilar airspace disease and atelectasis is present. The airspace disease is compatible with pulmonary  edema.  When comparing to the prior chest radiograph of 08/28/2013, pulmonary edema is minimally improved.  IMPRESSION: Mildly improved volume overload/ CHF, still moderate. Prominent basilar opacity represents a combination of pleural effusions, atelectasis and airspace disease/edema  These results were called by telephone at the time of interpretation on 09/01/2013 at 12:57 AM to Dr. Chaney Malling , who verbally acknowledged these results.   Electronically Signed   By: Dereck Ligas M.D.   On: 09/01/2013 00:57   Dg Chest Port 1 View  08/28/2013  CLINICAL DATA:  Shortness of breath.  EXAM: PORTABLE CHEST - 1 VIEW  COMPARISON:  Chest radiograph from 08/23/2013  FINDINGS: The lungs are well-aerated. Bibasilar airspace opacification may reflect multifocal pneumonia or pulmonary edema. Small bilateral pleural effusions are suspected, though the costophrenic angles are incompletely imaged on this study. No pneumothorax is seen.  The cardiomediastinal silhouette is borderline normal in size. A pacemaker is noted overlying the left chest wall, with leads ending overlying the right atrium and right ventricle. No acute osseous abnormalities are seen.  IMPRESSION: Bibasilar airspace opacification may reflect multifocal pneumonia or pulmonary edema. Suspect small bilateral pleural effusions.   Electronically Signed   By: Garald Balding M.D.   On: 08/28/2013 03:58   Dg Chest Port 1 View  08/24/2013   CLINICAL DATA:  Shortness of breath.  Infiltrates.  EXAM: PORTABLE CHEST - 1 VIEW  COMPARISON:  08/23/2013  FINDINGS: Two lead cardiac pacemaker without change in position. Cardiac enlargement with mild vascular congestion and interstitial edema. There seems to be mild progression since prior study. Atelectasis in the left lung base. No pneumothorax.  IMPRESSION: Cardiac enlargement with mild vascular congestion and interstitial edema, demonstrating some progression since prior study. Probable atelectasis in the left  lung base.   Electronically Signed   By: Lucienne Capers M.D.   On: 08/24/2013 03:05   Dg Abd Portable 1v  08/24/2013   CLINICAL DATA:  Abdominal pain.  Blood in stools.  EXAM: PORTABLE ABDOMEN - 1 VIEW  COMPARISON:  CT abdomen and pelvis 12/1912.  FINDINGS: Examination is technically limited due to field of view. Portions of the abdomen and pelvis are not included. The bowel gas pattern is normal. No radio-opaque calculi or other significant radiographic abnormality are seen.  IMPRESSION: No evidence of bowel obstruction.   Electronically Signed   By: Lucienne Capers M.D.   On: 08/24/2013 03:06   Dg Fluoro Guide Cv Line-no Report  08/28/2013   CLINICAL DATA: insertion of dialysis catheter   FLOURO GUIDE CV LINE  Fluoroscopy was utilized by the requesting physician.  No radiographic  interpretation.     Scheduled Meds: . acyclovir  400 mg Oral Daily  . amiodarone  200 mg Oral Daily  . benzonatate  100 mg Oral TID  . darbepoetin (ARANESP) injection - DIALYSIS  60 mcg Intravenous Q Tue-HD  . ferric gluconate (FERRLECIT/NULECIT) IV  125 mg Intravenous Q T,Th,Sa-HD  . megestrol  800 mg Oral Daily  . midodrine  10 mg Oral TID WC  . multivitamin  1 tablet Oral QHS  . sodium chloride  3 mL Intravenous Q12H  . vancomycin  500 mg Oral 4 times per day   Continuous Infusions:   Active Problems:   Multiple myeloma   HCAP (healthcare-associated pneumonia)   Pacemaker   Acute respiratory failure   Renal failure (ARF), acute on chronic   Diarrhea   Hypokalemia   Anemia   C. difficile colitis    Time spent: 40 minutes   [facility] Pager [phone]. If 8PM-8AM, please contact night-coverage at [email or web address], password [facility] 09/07/2013, 4:26 PM  LOS: 15 days

## 2013-09-07 NOTE — Progress Notes (Signed)
Spoke with Dara Hoyer, HD Secretary: Patient has been clipped to [facility] with a Tues-Thurs-Sat schedule 2nd shift. Patient will start 6/27 and should arrive by 1030am. [facility] is to visit [facility] on 6/26 to sign paperwork.  Caple, Bryn Gulling

## 2013-09-08 LAB — BASIC METABOLIC PANEL
BUN: 5 mg/dL — ABNORMAL LOW (ref 6–23)
CALCIUM: 7.9 mg/dL — AB (ref 8.4–10.5)
CO2: 28 mEq/L (ref 19–32)
CREATININE: 2.07 mg/dL — AB (ref 0.50–1.35)
Chloride: 100 mEq/L (ref 96–112)
GFR calc Af Amer: 35 mL/min — ABNORMAL LOW (ref >90)
GFR, EST NON AFRICAN AMERICAN: 31 mL/min — AB (ref >90)
GLUCOSE: 73 mg/dL (ref 70–99)
Potassium: 4.1 mEq/L (ref 3.7–5.3)
SODIUM: 138 meq/L (ref 137–147)

## 2013-09-08 LAB — GLUCOSE, CAPILLARY
GLUCOSE-CAPILLARY: 71 mg/dL (ref 70–99)
GLUCOSE-CAPILLARY: 76 mg/dL (ref 70–99)
Glucose-Capillary: 86 mg/dL (ref 70–99)

## 2013-09-08 NOTE — Progress Notes (Signed)
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. Subsequently found to be C. Diff + and on Rx fr that.  Dialysis dependent since 6/15 and Dr. Florene [name] his primary nephrologist deemed him ESRD.   Assessment/Plan:  1. AKI on advancedCKD/ now with new ESRD- likely related to ischemic ATN in setting of volume depletion and hypotension.  New ESRD. Had Scott [name] placed 6/15 Dr. Donnetta [name].  CLIP  - has spot at [name] when ready for discharge. Next HD planned for tomorrow- can do in [place] on Sat if leaves today, otherwise will need to be done here first shift on Saturday 2. Right brachiocephalic AVF - s/p second stage BVT  (6/19) Dr. Donnetta [name] 3. C. Diff colitis-    high dose po vanco - not yet deemed [name] treatment failure with vanco - stool seems to be slowing down 4. Bilat PNA on CXR/HAP; cough with purulent sputum. Treating as HCAP - abx complete 5. Pulm edema - previously unable to effectively pull fluid  d/t low BP; done better lately- no O2 6. Multiple myeloma- recently finished [name] cycle of chemo 6/9. Next cycle due but should be delayed with infx 7.  Anemia - Aranesp 60 QTues started; iron load ongoing.  Will need more aranesp at discharge.  Rec'd 1 unit PRBC's in HD 6/20  8.  Bones - PTH 166 - within outpt goal range of 150-300 on no med at this time. Phos low (3.5) no binders- on renavite 9.  Hypotension - BP's  soft.   Stopped tamsulosin.  Added midodrine to facilitate vol removal with HD  10. Weakness/deconditoning - OT/PT will be needed- apparently now accepting SNF, possible discharge  today ?   11. Left sided PPM  Subjective: Dispo has changed as now has consented to SNF-  Possible discharge today to [name]   Objective: Vital signs in last 24 hours: Temp:  [97.8 F (36.6 C)-98.3 F (36.8 C)] 97.8 F (36.6 C) (06/26 0900) Pulse Rate:  [60-71] 71 (06/26 0900) Resp:  [17-19] 19 (06/26 0900) BP: (97-131)/(52-64) 131/64 mmHg (06/26 0900) SpO2:  [94 %-99 %] 99 % (06/26 0900) Weight:  [70.2 kg (154 lb 12.2 oz)-70.8 kg (156 lb 1.4 oz)] 70.2 kg (154 lb 12.2 oz) (06/26 0618) Weight change: -0.331 kg (-11.7 oz)  Intake/Output from previous day: 06/25 0701 - 06/26 0700 In: 410 [P.O.:410] Out: 2500 [Urine:200] Intake/Output this shift: Total I/O In: 240 [P.O.:240] Out: 0  BP 131/64  Pulse 71  Temp(Src) 97.8 F (36.6 C) (Oral)  Resp 19  Ht 5' 5"  (1.651 m)  Wt 70.2 kg (154 lb 12.2 oz)  BMI 25.75 kg/m2  SpO2 99% Wearing O2 and currently breathing OK  Right TDC with dressing intact/non-tender Right AVF (2nd stage BVT done 6/19) with good bruit and wounds dry Lungs grossly clear anteriorly/reduced BS posteriorly with some base crackles S1S2 No S3 GI: soft, non-tender; bowel sounds normal; no masses,  no organomegaly Trace  edema of LE's Flexiseal with watery stool  Recent Labs  09/06/13 0530 09/07/13 0745  WBC 14.9* 14.4*  HGB 8.9* 8.3*  HCT 27.4* 26.0*  PLT 179 199   Results for Scott, [name] (MRN [id number]) as of 08/29/2013 14:55  Ref. Range 08/29/2013 03:14  Iron Latest Range: 42-135 ug/dL <10 (L)  UIBC Latest Range: 125-400 ug/dL 90 (L)  TIBC Latest Range: 215-435 ug/dL Not calculated due to Iron <10.  Saturation Ratios Latest Range: 20-55 % Not calculated due to Iron <10.  Ferritin Latest Range: 22-322 ng/mL 812 (H)   Results for Scott, [name] (MRN [id number]) as of 08/31/2013 12:41  Ref. Range 08/29/2013 03:14  PTH Latest Range: 14.0-72.0 pg/mL 166.5 (H)    Recent Labs  09/07/13 0449 09/08/13 0305  NA 138 138  K 3.9 4.1  CL 99 100  CO2 29 28  GLUCOSE 69* 73  BUN 12 5*  CREATININE 3.06* 2.07*  CALCIUM 8.0* 7.9*  Phos                                1.9                                  Studies/Results: No results found.  Scheduled: . acyclovir  400 mg Oral Daily  . amiodarone  200 mg Oral Daily  . benzonatate  100 mg Oral TID  . darbepoetin (ARANESP) injection - DIALYSIS  60 mcg Intravenous Q Tue-HD  . ferric gluconate (FERRLECIT/NULECIT) IV  125 mg Intravenous Q T,Th,Sa-HD  . megestrol  800 mg Oral Daily  . midodrine  10 mg Oral TID WC  . multivitamin  1 tablet Oral QHS  . sodium chloride  3 mL Intravenous Q12H  . vancomycin  500 mg Oral 4 times per day     LOS: 16 days   Scott [name] 09/08/2013,10:37 AM

## 2013-09-08 NOTE — Progress Notes (Signed)
Pt. Refused cpap for tonight. RT informed pt. To notify if he changes his mind.  

## 2013-09-08 NOTE — Clinical Social Work Placement (Addendum)
Clinical Social Work Department CLINICAL SOCIAL WORK PLACEMENT NOTE 09/08/2013  Patient:  Scott [name], Scott [name]  Account Number:  [id number] [place] date:  08/23/2013  Clinical Social Worker:  Dat Derksen Givens, LCSW  Date/time:  09/08/2013 03:54 AM  Clinical Social Work is seeking post-discharge placement for this patient at the following level of care:   SKILLED NURSING   (*CSW will update this form in Epic as items are completed)   09/04/2013  Patient/family provided with [facility] Department of Clinical Social Work's list of facilities offering this level of care within the geographic area requested by the patient (or if unable, by the patient's family).  09/04/2013  Patient/family informed of their freedom to choose among providers that offer the needed level of care, that participate in Medicare, Medicaid or managed care program needed by the patient, have an available bed and are willing to accept the patient.    Patient/family informed of [facility]' ownership interest in [facility], as well as of the fact that they are under no obligation to receive care at this facility.  PASARR submitted to EDS in 2014  PASARR number received in 2014   FL2 transmitted to all facilities in geographic area requested by pt/family on  09/07/2013 FL2 transmitted to all facilities within larger geographic area on   Patient informed that his/her managed care company has contracts with or will negotiate with  certain facilities, including the following:     Patient/family informed of bed offers received:  09/08/2013 Patient chooses bed at [facility] Physician recommends and patient chooses bed at    Patient to be transferred to [place] on  09/08/2013 Patient to be transferred to facility by ambulance Patient and family notified of transfer on 09/08/2013 Name of family member notified:  [name], wife.  The following physician request were entered in  Epic:   Additional Comments:

## 2013-09-08 NOTE — Discharge Summary (Addendum)
Physician Discharge Summary  Scott [name] MRN: [id number] DOB/AGE: 07-26-1940 73 y.o.  PCP: Bufford Buttner, MD   Admit date: 08/23/2013 Discharge date: 09/08/2013  Discharge Diagnoses:      Multiple myeloma   HCAP (healthcare-associated pneumonia)   Pacemaker   Acute respiratory failure   Renal failure (ARF), acute on chronic   Diarrhea   Hypokalemia   Anemia   C. difficile colitis  Follow up recommendations #1 follow up with PCP in 1 week #2 BMP and CBC in one week    Medication List    STOP taking these medications       furosemide 80 MG tablet  Commonly known as:  LASIX      TAKE these medications       ACCU-CHEK AVIVA PLUS test strip  Generic drug:  glucose blood     ACCU-CHEK SOFTCLIX LANCETS lancets     acyclovir 400 MG tablet  Commonly known as:  ZOVIRAX  Take 400 mg by mouth daily.     amiodarone 200 MG tablet  Commonly known as:  PACERONE  Take 200 mg by mouth daily.     dexamethasone 4 MG tablet  Commonly known as:  DECADRON  Take 40 mg by mouth every 7 (seven) days. Every Tuesday.     ferrous sulfate 325 (65 FE) MG tablet  Take 325 mg by mouth daily with breakfast.     guaiFENesin-codeine 100-10 MG/5ML syrup  Take 5 mLs by mouth every 6 (six) hours as needed for cough.     ipratropium-albuterol 0.5-2.5 (3) MG/3ML Soln  Commonly known as:  DUONEB  Take 3 mLs by nebulization every 4 (four) hours as needed.     megestrol 40 MG/ML suspension  Commonly known as:  MEGACE  Take 800 mg by mouth daily.     midodrine 10 MG tablet  Commonly known as:  PROAMATINE  Take 1 tablet (10 mg total) by mouth 3 (three) times daily with meals.     tamsulosin 0.4 MG Caps capsule  Commonly known as:  FLOMAX  Take 0.4 mg by mouth daily after breakfast.     traMADol 50 MG tablet  Commonly known as:  ULTRAM  Take 1 tablet (50 mg total) by mouth every 6 (six) hours as needed for moderate pain.     vancomycin 50 mg/mL oral solution  Commonly  known as:  VANCOCIN  vancomycing 521m QID x 10days (currently day 6 of 10), then taper to 1258mQID x 5 days, then 12539mID x 5 days, then 125m60mD x 5 days, then 125mg54mly x 5 days then stop.        Discharge Condition: Stable   Disposition: 01-Home or Self Care   Consults:     Significant Diagnostic Studies: Dg Chest 1 View  08/28/2013   CLINICAL DATA:  Central catheter placement  EXAM: CHEST - 1 VIEW  COMPARISON:  Study obtained earlier in the day  FINDINGS: Dual-lumen central catheter tip is at the cavoatrial junction. No pneumothorax. There is moderate generalized interstitial edema with patchy alveolar edema in the bases. Heart is mildly enlarged with mild pulmonary venous hypertension. No adenopathy.  IMPRESSION: Central catheter tip at cavoatrial junction. No pneumothorax. Underlying congestive heart failure. Superimposed pneumonia in the bases cannot be excluded radiographically.   Electronically Signed   By: WilliLowella Grip   On: 08/28/2013 12:28   Dg Chest 2 View  09/04/2013   CLINICAL DATA:  Pneumonia  EXAM: CHEST  2 VIEW  COMPARISON:  08/31/2013  FINDINGS: Cardiac shadow is stable. A pacing device and jugular dialysis catheter are again noted. Improved aeration is noted bilaterally with some persistent infiltrate in the left base.  IMPRESSION: Persistent left basilar changes.  Overall improved aeration when compare with the prior study.   Electronically Signed   By: Inez Catalina M.D.   On: 09/04/2013 08:03   Dg Chest 2 View  08/30/2013   CLINICAL DATA:  Multiple myeloma and pneumonia.  EXAM: CHEST  2 VIEW  COMPARISON:  August 28, 2013 11:56 a.m.  FINDINGS: The heart size and mediastinal contours are stable. The heart size is enlarged. Cardiac pacemaker and dual lumen right central venous line are unchanged. There are consolidation of bilateral lung bases with small bilateral pleural effusions. There is mild central pulmonary vascular congestion. The visualized skeletal  structures are stable.  IMPRESSION: Bibasilar pneumonias with small bilateral pleural effusions. Mild pulmonary vascular congestion.   Electronically Signed   By: Abelardo Diesel M.D.   On: 08/30/2013 17:28   Dg Chest Port 1 View  09/01/2013   CLINICAL DATA:  Cough.  Short of breath.  EXAM: PORTABLE CHEST - 1 VIEW  COMPARISON:  08/30/2013.  08/28/2013.  FINDINGS: Right IJ dialysis catheter is present, unchanged. Two lead left subclavian pacemaker leads are also unchanged there is moderate volume overload/ CHF. Dense retrocardiac opacity likely representing a combination of effusion, atelectasis and edema. Bilateral basilar airspace disease and atelectasis is present. The airspace disease is compatible with pulmonary edema.  When comparing to the prior chest radiograph of 08/28/2013, pulmonary edema is minimally improved.  IMPRESSION: Mildly improved volume overload/ CHF, still moderate. Prominent basilar opacity represents a combination of pleural effusions, atelectasis and airspace disease/edema  These results were called by telephone at the time of interpretation on 09/01/2013 at 12:57 AM to Dr. Chaney Malling , who verbally acknowledged these results.   Electronically Signed   By: Dereck Ligas M.D.   On: 09/01/2013 00:57   Dg Chest Port 1 View  08/28/2013   CLINICAL DATA:  Shortness of breath.  EXAM: PORTABLE CHEST - 1 VIEW  COMPARISON:  Chest radiograph from 08/23/2013  FINDINGS: The lungs are well-aerated. Bibasilar airspace opacification may reflect multifocal pneumonia or pulmonary edema. Small bilateral pleural effusions are suspected, though the costophrenic angles are incompletely imaged on this study. No pneumothorax is seen.  The cardiomediastinal silhouette is borderline normal in size. A pacemaker is noted overlying the left chest wall, with leads ending overlying the right atrium and right ventricle. No acute osseous abnormalities are seen.  IMPRESSION: Bibasilar airspace opacification may  reflect multifocal pneumonia or pulmonary edema. Suspect small bilateral pleural effusions.   Electronically Signed   By: Garald Balding M.D.   On: 08/28/2013 03:58   Dg Chest Port 1 View  08/24/2013   CLINICAL DATA:  Shortness of breath.  Infiltrates.  EXAM: PORTABLE CHEST - 1 VIEW  COMPARISON:  08/23/2013  FINDINGS: Two lead cardiac pacemaker without change in position. Cardiac enlargement with mild vascular congestion and interstitial edema. There seems to be mild progression since prior study. Atelectasis in the left lung base. No pneumothorax.  IMPRESSION: Cardiac enlargement with mild vascular congestion and interstitial edema, demonstrating some progression since prior study. Probable atelectasis in the left lung base.   Electronically Signed   By: Lucienne Capers M.D.   On: 08/24/2013 03:05   Dg Abd Portable 1v  08/24/2013   CLINICAL DATA:  Abdominal pain.  Blood in stools.  EXAM: PORTABLE ABDOMEN - 1 VIEW  COMPARISON:  CT abdomen and pelvis 12/1912.  FINDINGS: Examination is technically limited due to field of view. Portions of the abdomen and pelvis are not included. The bowel gas pattern is normal. No radio-opaque calculi or other significant radiographic abnormality are seen.  IMPRESSION: No evidence of bowel obstruction.   Electronically Signed   By: Lucienne Capers M.D.   On: 08/24/2013 03:06   Dg Fluoro Guide Cv Line-no Report  08/28/2013   CLINICAL DATA: insertion of dialysis catheter   FLOURO GUIDE CV LINE  Fluoroscopy was utilized by the requesting physician.  No radiographic  interpretation.       Microbiology: No results found for this or any previous visit (from the past 240 hour(s)).   Labs: Results for orders placed during the hospital encounter of 08/23/13 (from the past 48 hour(s))  GLUCOSE, CAPILLARY     Status: None   Collection Time    09/06/13  4:35 PM      Result Value Ref Range   Glucose-Capillary 86  70 - 99 mg/dL   Comment 1 Notify RN     Comment 2  Documented in Chart    GLUCOSE, CAPILLARY     Status: Abnormal   Collection Time    09/06/13  9:11 PM      Result Value Ref Range   Glucose-Capillary 134 (*) 70 - 99 mg/dL  BASIC METABOLIC PANEL     Status: Abnormal   Collection Time    09/07/13  4:49 AM      Result Value Ref Range   Sodium 138  137 - 147 mEq/L   Potassium 3.9  3.7 - 5.3 mEq/L   Chloride 99  96 - 112 mEq/L   CO2 29  19 - 32 mEq/L   Glucose, Bld 69 (*) 70 - 99 mg/dL   BUN 12  6 - 23 mg/dL   Creatinine, Ser 3.06 (*) 0.50 - 1.35 mg/dL   Calcium 8.0 (*) 8.4 - 10.5 mg/dL   GFR calc non Af Amer 19 (*) >90 mL/min   GFR calc Af Amer 22 (*) >90 mL/min   Comment: (NOTE)     The eGFR has been calculated using the CKD EPI equation.     This calculation has not been validated in all clinical situations.     eGFR's persistently <90 mL/min signify possible Chronic Kidney     Disease.  CBC     Status: Abnormal   Collection Time    09/07/13  7:45 AM      Result Value Ref Range   WBC 14.4 (*) 4.0 - 10.5 K/uL   RBC 2.87 (*) 4.22 - 5.81 MIL/uL   Hemoglobin 8.3 (*) 13.0 - 17.0 g/dL   HCT 26.0 (*) 39.0 - 52.0 %   MCV 90.6  78.0 - 100.0 fL   MCH 28.9  26.0 - 34.0 pg   MCHC 31.9  30.0 - 36.0 g/dL   RDW 16.2 (*) 11.5 - 15.5 %   Platelets 199  150 - 400 K/uL  GLUCOSE, CAPILLARY     Status: None   Collection Time    09/07/13 11:57 AM      Result Value Ref Range   Glucose-Capillary 73  70 - 99 mg/dL  GLUCOSE, CAPILLARY     Status: None   Collection Time    09/07/13  4:36 PM      Result Value Ref Range   Glucose-Capillary 90  70 - 99 mg/dL   Comment 1 Notify RN     Comment 2 Documented in Chart    GLUCOSE, CAPILLARY     Status: None   Collection Time    09/07/13 10:04 PM      Result Value Ref Range   Glucose-Capillary 97  70 - 99 mg/dL  BASIC METABOLIC PANEL     Status: Abnormal   Collection Time    09/08/13  3:05 AM      Result Value Ref Range   Sodium 138  137 - 147 mEq/L   Potassium 4.1  3.7 - 5.3 mEq/L   Chloride  100  96 - 112 mEq/L   CO2 28  19 - 32 mEq/L   Glucose, Bld 73  70 - 99 mg/dL   BUN 5 (*) 6 - 23 mg/dL   Creatinine, Ser 2.07 (*) 0.50 - 1.35 mg/dL   Calcium 7.9 (*) 8.4 - 10.5 mg/dL   GFR calc non Af Amer 31 (*) >90 mL/min   GFR calc Af Amer 35 (*) >90 mL/min   Comment: (NOTE)     The eGFR has been calculated using the CKD EPI equation.     This calculation has not been validated in all clinical situations.     eGFR's persistently <90 mL/min signify possible Chronic Kidney     Disease.  GLUCOSE, CAPILLARY     Status: None   Collection Time    09/08/13  8:03 AM      Result Value Ref Range   Glucose-Capillary 71  70 - 99 mg/dL  GLUCOSE, CAPILLARY     Status: None   Collection Time    09/08/13 12:08 PM      Result Value Ref Range   Glucose-Capillary 86  70 - 99 mg/dL     73 y.o. male with history of multiple myeloma receiving chemotherapy every Tuesday (last tx day bfr admit), chronic kidney disease, anemia, and s/p pacemaker placement who had been experiencing diarrhea for one week.  Patient became short of breath and also noticed swelling in his lower extremities.  In the ER at [facility] the patient's chest x-ray showed vascular congestion. Patient was found to be hypotensive and creatinine was found to be increased from baseline (as per our records patient's creatinine was around 3.5 last March). Patient was transferred to [facility] due to his worsening renal function. Patient was admitted to step down unit.  Nephrology was consulted (Dr Florene Glen, Dr Lorrene Reid) and AKI on CKD was thought likely related to ischemic ATN in the setting of volume depletion from diarrhea and hypotension. Due to no significant improvement in renal function, right IJ hemodialysis catheter was placed and dialysis was initiated on 08/28/13. Patient has been tolerating hemodialysis and has been approved for outpatient dialysis center.    C. difficile was positive and patient was started on oral vancomycin.    On 6/19 patient was found to have hypoxic respiratory failure in the setting of persistent pulmonary edema vs possible bilateral infiltrates, patient was transferred back to step down unit and placed on BiPAP. He was also placed on IV antibiotics for assumed HCAP.   Difficult situation with the IV antibiotics and C. difficile colitis, patient has been having intractable diarrhea, flexiseal was placed. ID consult was obtained and patient was seen by Dr. Karolee Ohs who recommended 5 days sufficient for HCAP treatment.   IV antibiotics discontinued on 6/22 to effectively treat C. difficile infection.   Patient is also having  significant anxiety issues hence xanax was started at that time as needed.      Detailed hospital course:  Principal Problem:  Acute respiratory failure with Healthcare associated pneumonia/bilateral pneumonia chest x-ray/ leukocytosis  - Treated in step down on BiPAP, currently off BiPAP , , patient reports significant improvement in his symptoms and anxiety with xanax low dose.  - Repeat chest x-ray 6/22 shows significant improvement .  - As per ID recommendations Dr. Graylon Good, recommended 5 days of total IV antibiotics for pneumonia, discontinued IV antibiotics 6/22    Hypo-tension  Stopped Flomax , systolic blood pressure has now range from 100 to 130s Added midodrine to facilitate vol removal with HD but pt still with edema in his lower extremities    C. difficile colitis : patient was on IV antibiotics for pneumonia,  Removed flexiseal 3 days ago and diarrhea improving Diarrhea improved after discontinuation of IV antibiotics on 6/22  Infectious disease recommends high dose of vancomycing 561m QID x 10days (currently day 7 of 10), then taper to 1241mQID x 5 days, then 12556mID x 5 days, then 125m7mD x 5 days, then 125mg26mly x 5 days then stop  .  Acute renal failure on chronic kidney disease  Baseline crt ~3.5 - likely related to ischemic ATN in  setting of volume depletion and hypotension - care as per Nephrology - now s/p HD #1 on 6/15, 6/16, 6/18, 6/20  - Patient approved for outpatient dialysis center[facility] for [facility]  [facility]  - Status post right IJ hemodialysis catheter, Brachiobasilic AVF 4/27/0/17/51ascular surgery following, Second stage right upper arm basilic vein transposition done on  6/19 by Dr. early     Metabolic acidosis resolved, likely due to renal failure + profuse diarrhea, HD ongoing   Multiple myeloma  - Per nephrology, recently finished chemotherapy cycle, next cycle due but should be delayed due to acute infection    Multifactorial chronic anemia  no evidence of acute blood loss - labs confirm signif Fe deficiency -  Aranesp 60 QTues started; iron load ongoing. Will need more aranesp at discharge. He received 1 unit PRBC's in HD 6/20    Severe Hypokalemia  Due to GI losses + poor intake, currently improved , most recent potassium was 4.1  Thrombocytopenia  No evidence of bleeding - follow trend , Currently improving      Discharge Exam:   Blood pressure 131/64, pulse 71, temperature 97.8 F (36.6 C), temperature source Oral, resp. rate 19, height 5' 5"  (1.651 m), weight 70.2 kg (154 lb 12.2 oz), SpO2 99.00%.  General: Alert and awake, oriented x3, NAD  CVS: S1-S2 clear, no murmur rubs or gallops  Chest: CTA B  Abdomen: soft nontender, nondistended, normal bowel sounds  Extremities: no cyanosis, clubbing or edema noted bilaterally  flexiseal +         Discharge Instructions   Diet - low sodium heart healthy    Complete by:  As directed      Diet - low sodium heart healthy    Complete by:  As directed      Increase activity slowly    Complete by:  As directed      Increase activity slowly    Complete by:  As directed            Follow-up Information   Follow up with WILLIBufford Buttner  MD In 1 week.   Specialty:  Family Medicine   Contact information:   [place] [address] [phone]       Signed: Reyne Dumas 09/08/2013, 2:01 PM

## 2013-09-08 NOTE — Progress Notes (Signed)
Called report to Ermalene Postin, [place].

## 2013-10-03 ENCOUNTER — Ambulatory Visit (HOSPITAL_COMMUNITY)
Admission: RE | Admit: 2013-10-03 | Discharge: 2013-10-03 | Disposition: A | Discharge status: Home / Self Care | Payer: Medicare HMO | Source: Ambulatory Visit | Attending: Vascular Surgery | Admitting: Vascular Surgery

## 2013-10-03 DIAGNOSIS — Z4931 Encounter for adequacy testing for hemodialysis: Secondary | ICD-10-CM | POA: Insufficient documentation

## 2013-10-03 DIAGNOSIS — N186 End stage renal disease: Secondary | ICD-10-CM | POA: Insufficient documentation

## 2013-10-09 ENCOUNTER — Telehealth: Payer: Self-pay

## 2013-10-09 ENCOUNTER — Encounter: Payer: Self-pay | Admitting: Vascular Surgery

## 2013-10-09 NOTE — Telephone Encounter (Signed)
rec'd phone call from Registered Nurse with [facility].  Reported pt. Has "pus drainage" @ site of (R) 2nd stage BVT.  Reported there is no fever, and no redness/ tenderness @ the site.  Stated pt. has appt. tomorrow, but questioned if he could be evaluated today?   Wife was on conference call, and stated the pt. had Hemodialysis @ 11:45-4:00 PM., today.  [employer] nurse stated that the wife should report pt's symptoms to the nurse at the kidney center, and have them assess the site.  Advised that [facility] will await phonecall from the dialysis center, otherwise will see pt. @ 8:30 AM 7/28.

## 2013-10-10 ENCOUNTER — Ambulatory Visit (INDEPENDENT_AMBULATORY_CARE_PROVIDER_SITE_OTHER): Payer: Medicare HMO | Admitting: Vascular Surgery

## 2013-10-10 ENCOUNTER — Encounter: Payer: Self-pay | Admitting: Vascular Surgery

## 2013-10-10 VITALS — BP 94/41 | HR 69 | Ht 65.0 in | Wt 154.0 lb

## 2013-10-10 DIAGNOSIS — N186 End stage renal disease: Secondary | ICD-10-CM | POA: Insufficient documentation

## 2013-10-10 DIAGNOSIS — Z992 Dependence on renal dialysis: Secondary | ICD-10-CM

## 2013-10-10 NOTE — Progress Notes (Signed)
Here today for followup of his second stage similar vein transposition. Had undergone first stage procedure by Dr. Bridgett Larsson in April. Underwent a second stage mobilization by myself on 09/01/2013. He continues to have a poor failing health. Has multiple questions regarding Y. he remains tired all the time with a diminished appetite. I've asked him to discuss this with the nephrologist at dialysis.  On physical exam he does have a stitch abscess area the antecubital space. This was debrided in the office and there is no evidence of deep infection. I instructed his family on local wound care of this. The other incisions are healing quite nicely. He does have a good flow through the basilic vein transposition fistula. It is of good size and is very superficial. It appears to be very thin walled.  He underwent duplex of this on 10/03/2013. The stitches good size maturation.  Impression and plan stable status post second stage of basilic vein transposition on 09/01/2013. He does have good size maturation a good flow. Does appear to be very thin walled from physical exam standpoint. I would wait a total of 3 months following mobilization to give the best chance to not have problems with infiltration of his basilic vein transposition fistula. Should be okay to begin using this in mid September unless he has trouble with his catheter. If so I would be able to use this for several weeks before that time frame at the end of August. He will see [place] again on an as-needed basis

## 2013-10-16 ENCOUNTER — Emergency Department (HOSPITAL_COMMUNITY): Payer: Medicare HMO

## 2013-10-16 ENCOUNTER — Encounter (HOSPITAL_COMMUNITY): Payer: Self-pay | Admitting: Emergency Medicine

## 2013-10-16 ENCOUNTER — Emergency Department (HOSPITAL_COMMUNITY)
Admission: EM | Admit: 2013-10-16 | Discharge: 2013-10-16 | Disposition: A | Discharge status: Home / Self Care | Payer: Medicare HMO | Attending: Emergency Medicine | Admitting: Emergency Medicine

## 2013-10-16 DIAGNOSIS — E119 Type 2 diabetes mellitus without complications: Secondary | ICD-10-CM | POA: Insufficient documentation

## 2013-10-16 DIAGNOSIS — J069 Acute upper respiratory infection, unspecified: Secondary | ICD-10-CM | POA: Insufficient documentation

## 2013-10-16 DIAGNOSIS — N186 End stage renal disease: Secondary | ICD-10-CM | POA: Diagnosis not present

## 2013-10-16 DIAGNOSIS — M129 Arthropathy, unspecified: Secondary | ICD-10-CM | POA: Diagnosis not present

## 2013-10-16 DIAGNOSIS — Z950 Presence of cardiac pacemaker: Secondary | ICD-10-CM | POA: Insufficient documentation

## 2013-10-16 DIAGNOSIS — Z8669 Personal history of other diseases of the nervous system and sense organs: Secondary | ICD-10-CM | POA: Diagnosis not present

## 2013-10-16 DIAGNOSIS — L8994 Pressure ulcer of unspecified site, stage 4: Secondary | ICD-10-CM | POA: Diagnosis not present

## 2013-10-16 DIAGNOSIS — M549 Dorsalgia, unspecified: Secondary | ICD-10-CM | POA: Insufficient documentation

## 2013-10-16 DIAGNOSIS — Z79899 Other long term (current) drug therapy: Secondary | ICD-10-CM | POA: Diagnosis not present

## 2013-10-16 DIAGNOSIS — L89109 Pressure ulcer of unspecified part of back, unspecified stage: Secondary | ICD-10-CM | POA: Diagnosis not present

## 2013-10-16 DIAGNOSIS — Z85828 Personal history of other malignant neoplasm of skin: Secondary | ICD-10-CM | POA: Insufficient documentation

## 2013-10-16 DIAGNOSIS — F17200 Nicotine dependence, unspecified, uncomplicated: Secondary | ICD-10-CM | POA: Diagnosis not present

## 2013-10-16 DIAGNOSIS — IMO0002 Reserved for concepts with insufficient information to code with codable children: Secondary | ICD-10-CM | POA: Insufficient documentation

## 2013-10-16 DIAGNOSIS — C9000 Multiple myeloma not having achieved remission: Secondary | ICD-10-CM | POA: Diagnosis not present

## 2013-10-16 DIAGNOSIS — I959 Hypotension, unspecified: Secondary | ICD-10-CM | POA: Diagnosis not present

## 2013-10-16 DIAGNOSIS — L98499 Non-pressure chronic ulcer of skin of other sites with unspecified severity: Secondary | ICD-10-CM | POA: Diagnosis present

## 2013-10-16 DIAGNOSIS — Z8701 Personal history of pneumonia (recurrent): Secondary | ICD-10-CM | POA: Diagnosis not present

## 2013-10-16 DIAGNOSIS — Z992 Dependence on renal dialysis: Secondary | ICD-10-CM | POA: Diagnosis not present

## 2013-10-16 DIAGNOSIS — R609 Edema, unspecified: Secondary | ICD-10-CM | POA: Insufficient documentation

## 2013-10-16 DIAGNOSIS — L89154 Pressure ulcer of sacral region, stage 4: Secondary | ICD-10-CM

## 2013-10-16 LAB — CBC WITH DIFFERENTIAL/PLATELET
BASOS PCT: 0 % (ref 0–1)
Basophils Absolute: 0 10*3/uL (ref 0.0–0.1)
Eosinophils Absolute: 0.1 10*3/uL (ref 0.0–0.7)
Eosinophils Relative: 0 % (ref 0–5)
HCT: 29.3 % — ABNORMAL LOW (ref 39.0–52.0)
HEMOGLOBIN: 9.5 g/dL — AB (ref 13.0–17.0)
LYMPHS PCT: 4 % — AB (ref 12–46)
Lymphs Abs: 0.7 10*3/uL (ref 0.7–4.0)
MCH: 28.3 pg (ref 26.0–34.0)
MCHC: 32.4 g/dL (ref 30.0–36.0)
MCV: 87.2 fL (ref 78.0–100.0)
MONO ABS: 0.7 10*3/uL (ref 0.1–1.0)
MONOS PCT: 4 % (ref 3–12)
NEUTROS ABS: 14.5 10*3/uL — AB (ref 1.7–7.7)
NEUTROS PCT: 92 % — AB (ref 43–77)
Platelets: 186 10*3/uL (ref 150–400)
RBC: 3.36 MIL/uL — ABNORMAL LOW (ref 4.22–5.81)
RDW: 19 % — ABNORMAL HIGH (ref 11.5–15.5)
WBC: 16 10*3/uL — ABNORMAL HIGH (ref 4.0–10.5)

## 2013-10-16 LAB — COMPREHENSIVE METABOLIC PANEL
ALBUMIN: 1.8 g/dL — AB (ref 3.5–5.2)
ALK PHOS: 78 U/L (ref 39–117)
ALT: 9 U/L (ref 0–53)
ANION GAP: 16 — AB (ref 5–15)
AST: 14 U/L (ref 0–37)
BILIRUBIN TOTAL: 0.7 mg/dL (ref 0.3–1.2)
BUN: 35 mg/dL — ABNORMAL HIGH (ref 6–23)
CHLORIDE: 100 meq/L (ref 96–112)
CO2: 26 meq/L (ref 19–32)
CREATININE: 4.59 mg/dL — AB (ref 0.50–1.35)
Calcium: 7.7 mg/dL — ABNORMAL LOW (ref 8.4–10.5)
GFR calc Af Amer: 14 mL/min — ABNORMAL LOW (ref >90)
GFR, EST NON AFRICAN AMERICAN: 12 mL/min — AB (ref >90)
Glucose, Bld: 114 mg/dL — ABNORMAL HIGH (ref 70–99)
POTASSIUM: 3.1 meq/L — AB (ref 3.7–5.3)
Sodium: 142 mEq/L (ref 137–147)
Total Protein: 5.1 g/dL — ABNORMAL LOW (ref 6.0–8.3)

## 2013-10-16 MED ORDER — ALBUTEROL SULFATE (2.5 MG/3ML) 0.083% IN NEBU
2.5000 mg | INHALATION_SOLUTION | Freq: Once | RESPIRATORY_TRACT | Status: AC
  Administered 2013-10-16: 2.5 mg via RESPIRATORY_TRACT
  Filled 2013-10-16: qty 3

## 2013-10-16 MED ORDER — MOXIFLOXACIN HCL 400 MG PO TABS: 400.0000 mg | ORAL_TABLET | Freq: Every day | ORAL | Status: DC

## 2013-10-16 NOTE — ED Notes (Signed)
Pt here for check of decubitus ulcer on bottom, sob, and hypotension, pt is dilaysis pt.

## 2013-10-16 NOTE — Progress Notes (Signed)
  CARE MANAGEMENT ED NOTE 10/16/2013  Patient:  KIPP, SHANK   Account Number:  [id number]  Date Initiated:  10/16/2013  Documentation initiated by:  Edwyna Shell  Subjective/Objective Assessment:   73 yo male presenting to the ED with sacral wound discomfort     Subjective/Objective Assessment Detail:     Action/Plan:   Patient will follow up with the the [facility] on 8/13 at 10:00 and [facility] services with [facility]   Action/Plan Detail:   Anticipated DC Date:       Status Recommendation to Physician:   Result of Recommendation:  Agreed    DC Planning Services  CM consult  Follow-up appt scheduled    Choice offered to / List presented to:            Status of service:  Completed, signed off  ED Comments:   ED Comments Detail:  CM consulted to assist with wound care management. This CM spoke with the EDP and he requested a wound center follow up for the sacral decubitus. This CM spoke with the patient and his spouse and they stated that they have home health with [facility]. This CM called and spoke with Chrys Racer with [facility] and updated her with the information that the patient has an appointment at the [facility]. She stated that to have the patient make sure that the Wound Care center is aware of [facility] services to fax information to the [facility] for follow up care. This CM informed the patient and spouse to provide the [facility] provider copies of the wound care center orders for follow up care. Provided patient and family appointment information. EDP aware of appointment and information provided. Patient and family verbalized understanding and had no other questions or concerns.

## 2013-10-16 NOTE — Discharge Instructions (Signed)
Cough, Adult  A cough is a reflex that helps clear your throat and airways. It can help heal the body or may be a reaction to an irritated airway. A cough may only last 2 or 3 weeks (acute) or may last more than 8 weeks (chronic).  CAUSES Acute cough:  Viral or bacterial infections. Chronic cough:  Infections.  Allergies.  Asthma.  Post-nasal drip.  Smoking.  Heartburn or acid reflux.  Some medicines.  Chronic lung problems (COPD).  Cancer. SYMPTOMS   Cough.  Fever.  Chest pain.  Increased breathing rate.  High-pitched whistling sound when breathing (wheezing).  Colored mucus that you cough up (sputum). TREATMENT   A bacterial cough may be treated with antibiotic medicine.  A viral cough must run its course and will not respond to antibiotics.  Your caregiver may recommend other treatments if you have a chronic cough. HOME CARE INSTRUCTIONS   Only take over-the-counter or prescription medicines for pain, discomfort, or fever as directed by your caregiver. Use cough suppressants only as directed by your caregiver.  Use a cold steam vaporizer or humidifier in your bedroom or home to help loosen secretions.  Sleep in a semi-upright position if your cough is worse at night.  Rest as needed.  Stop smoking if you smoke. SEEK IMMEDIATE MEDICAL CARE IF:   You have pus in your sputum.  Your cough starts to worsen.  You cannot control your cough with suppressants and are losing sleep.  You begin coughing up blood.  You have difficulty breathing.  You develop pain which is getting worse or is uncontrolled with medicine.  You have a fever. MAKE SURE YOU:   Understand these instructions.  Will watch your condition.  Will get help right away if you are not doing well or get worse. Document Released: 08/29/2010 Document Revised: 05/25/2011 Document Reviewed: 08/29/2010 [facility] Patient Information 2015 [place], [place]. This information is not intended  to replace advice given to you by your health care provider. Make sure you discuss any questions you have with your health care provider.  Pressure Ulcer A pressure ulcer is a sore that has formed from the breakdown of skin and exposure of deeper layers of tissue. It develops in areas of the body where there is unrelieved pressure. Pressure ulcers are usually found over a bony area, such as the shoulder blades, spine, lower back, hips, knees, ankles, and heels. Pressure ulcers vary in severity. Your health care provider may determine the severity (stage) of your pressure ulcer. The stages include:  Stage I--The skin is red, and when the skin is pressed, it stays red.  Stage II--The top layer of skin is gone, and there is a shallow, pink ulcer.  Stage III--The ulcer becomes deeper, and it is more difficult to see the whole wound. Also, there may be yellow or brown parts, as well as pink and red parts.  Stage IV--The ulcer may be deep and red, pink, brown, white, or yellow. Bone or muscle may be seen.  Unstageable pressure ulcer--The ulcer is covered almost completely with black, brown, or yellow tissue. It is not known how deep the ulcer is or what stage it is until this covering comes off.  Suspected deep tissue injury--A person's skin can be injured from pressure or pulling on the skin when his or her position is changed. The skin appears purple or maroon. There may not be an opening in the skin, but there could be a blood-filled blister. This deep tissue injury  is often difficult to see in people with darker skin tones. The site may open and become deeper in time. However, early interventions will help the area heal and may prevent the area from opening. CAUSES  Pressure ulcers are caused by pressure against the skin that limits the flow of blood to the skin and nearby tissues. There are many risk factors that can lead to pressure sores. RISK FACTORS  Decreased ability to move.  Decreased  ability to feel pain or discomfort.  Excessive skin moisture from urine, stool, sweat, or secretions.  Poor nutrition.  Dehydration.  Tobacco, drug, or alcohol abuse.  Having someone pull on bedsheets that are under you, such as when health care workers are changing your position in a hospital bed.  Obesity.  Increased adult age.  Hospitalization in a critical care unit for longer than 4 days with use of medical devices.  Prolonged use of medical devices.  Critical illness.  Anemia.  Traumatic brain injury.  Spinal cord injury.  Stroke.  Diabetes.  Poor blood glucose control.  Low blood pressure (hypotension).  Low oxygen levels.  Medicines that reduce blood flow.  Infection. DIAGNOSIS  Your health care provider will diagnose your pressure ulcer based on its appearance. The health care provider may determine the stage of your pressure ulcer as well. Tests may be done to check for infection, to assess your circulation, or to check for other diseases, such as diabetes. TREATMENT  Treatment of your pressure ulcer begins with determining what stage the ulcer is in. Your treatment team may include your health care provider, a wound care specialist, a nutritionist, a physical therapist, and a Psychologist, sport and exercise. Possible treatments may include:   Moving or repositioning every 1-2 hours.  Using beds or mattresses to shift your body weight and pressure points frequently.  Improving your diet.  Cleaning and bandaging (dressing) the open wound.  Giving antibiotic medicines.  Removing damaged tissue.  Surgery and sometimes skin grafts. HOME CARE INSTRUCTIONS  If you were hospitalized, follow the care plan that was started in the hospital.  Avoid staying in the same position for more than 2 hours. Use padding, devices, or mattresses to cushion your pressure points as directed by your health care provider.  Eat a well-balanced diet. Take nutritional supplements and vitamins  as directed by your health care provider.  Keep all follow-up appointments.  Only take over-the-counter or prescription medicines for pain, fever, or discomfort as directed by your health care provider. SEEK MEDICAL CARE IF:   Your pressure ulcer is not improving.  You do not know how to care for your pressure ulcer.  You notice other areas of redness on your skin.  You have a fever. SEEK IMMEDIATE MEDICAL CARE IF:   You have increasing redness, swelling, or pain in your pressure ulcer.  You notice pus coming from your pressure ulcer.  You notice a bad smell coming from the wound or dressing.  Your pressure ulcer opens up again. Document Released: 03/02/2005 Document Revised: 03/07/2013 Document Reviewed: 11/07/2012 [facility] Patient Information 2015 [place], [place]. This information is not intended to replace advice given to you by your health care provider. Make sure you discuss any questions you have with your health care provider.

## 2013-10-16 NOTE — ED Provider Notes (Signed)
CSN: [id number]     Arrival date & time 10/16/13  1044 History   First MD Initiated Contact with Patient 10/16/13 1155     Chief Complaint  Patient presents with  . Hypotension  . Skin Ulcer     (Consider location/radiation/quality/duration/timing/severity/associated sxs/prior Treatment) Patient is a 73 y.o. male presenting with back pain. The history is provided by the patient and the spouse.  Back Pain Location:  Sacro-iliac joint Quality:  Aching Radiates to:  Does not radiate Pain severity:  Mild Worse during: only when sitting on sacral ulcer. Onset quality:  Gradual Duration: 5-6 weeks. Timing:  Constant Progression:  Unchanged Chronicity:  Recurrent Context comment:  After recent hospitalization Relieved by:  Nothing Worsened by:  Nothing tried Ineffective treatments:  None tried Associated symptoms: no abdominal pain, no chest pain (chronic), no dysuria, no fever, no headaches and no numbness     Past Medical History  Diagnosis Date  . Renal disorder   . Pacemaker   . Shortness of breath     "when I take one of my chemo pills" (03/17/2013)  . Protein calorie malnutrition   . Multiple myeloma   . Skin cancer of face     "once; left side of my face" (03/17/2013)  . Pneumonia 06/09/2013  . Diabetes mellitus without complication     no meds  . Arthritis     "little in my right shoulder" (03/17/2013)  . History of blood transfusion   . Daily headache     denies  . Cataract    Past Surgical History  Procedure Laterality Date  . Appendectomy    . Kidney surgery Right 2000's    "cut out ~ 1/2"   . Insert / replace / remove pacemaker    . Bascilic vein transposition Right 07/10/2013    Procedure: BASILIC VEIN TRANSPOSITION- FIRST STAGE ;  Surgeon: Conrad [place]re, MD;  Location: [facility];  Service: Vascular;  Laterality: Right;  . Insertion of dialysis catheter Right 08/28/2013    Procedure: INSERTION OF DIALYSIS CATHETER in Right Internal Jugular;  Surgeon: [name],  MD;  Location: [facility];  Service: Vascular;  Laterality: Right;  . Bascilic vein transposition Right 09/01/2013    Procedure: BASILIC VEIN TRANSPOSITION - 2ND STAGE AVF CREATION;  Surgeon: [name], MD;  Location: [facility];  Service: Vascular;  Laterality: Right;  . Av fistula placement     Family History  Problem Relation Age of Onset  . Diabetes Mellitus II Father    History  Substance Use Topics  . Smoking status: Current Every Day Smoker -- 0.25 packs/day for 60 years    Types: Cigarettes  . Smokeless tobacco: Never Used     Comment: 03/17/2013 "smoked a cigarette yesterday; sometimes none"  . Alcohol Use: No    Review of Systems  Constitutional: Negative for fever.  HENT: Negative for drooling and rhinorrhea.   Eyes: Negative for pain.  Respiratory: Positive for cough. Negative for chest tightness.   Cardiovascular: Positive for leg swelling. Negative for chest pain (chronic).  Gastrointestinal: Negative for nausea, vomiting, abdominal pain and diarrhea.  Genitourinary: Negative for dysuria and hematuria.  Musculoskeletal: Positive for back pain. Negative for gait problem and neck pain.  Skin: Negative for color change.  Neurological: Negative for numbness and headaches.  Hematological: Negative for adenopathy.  Psychiatric/Behavioral: Negative for behavioral problems.  All other systems reviewed and are negative.     Allergies  Review of patient's allergies indicates no  known allergies.  Home Medications   Prior to Admission medications   Medication Sig Start Date End Date Taking? Authorizing Provider  ACCU-CHEK AVIVA PLUS test strip 1 each by Other route See admin instructions. Check blood sugar once daily. 05/31/13  Yes Historical Provider, MD  ACCU-CHEK SOFTCLIX LANCETS lancets 1 each by Other route See admin instructions. Check blood sugar once daily. 05/31/13  Yes Historical Provider, MD  acyclovir (ZOVIRAX) 400 MG tablet Take 400 mg by mouth daily.   Yes Historical  Provider, MD  amiodarone (PACERONE) 200 MG tablet Take 200 mg by mouth daily.   Yes Historical Provider, MD  dexamethasone (DECADRON) 4 MG tablet Take 40 mg by mouth every 7 (seven) days. Every Tuesday.   Yes Historical Provider, MD  ferrous sulfate 325 (65 FE) MG tablet Take 325 mg by mouth daily with breakfast.   Yes Historical Provider, MD  ipratropium-albuterol (DUONEB) 0.5-2.5 (3) MG/3ML SOLN Take 3 mLs by nebulization every 4 (four) hours as needed (for shortness of breath).   Yes Historical Provider, MD  megestrol (MEGACE) 40 MG/ML suspension Take 800 mg by mouth daily.   Yes Historical Provider, MD  Menthol-Methyl Salicylate (MUSCLE RUB EX) Apply 1 application topically as needed. For muscle cramps 07/24/13  Yes Historical Provider, MD  midodrine (PROAMATINE) 10 MG tablet Take 1 tablet (10 mg total) by mouth 3 (three) times daily with meals. 09/07/13  Yes Reyne Dumas, MD  neomycin-bacitracin-polymyxin (NEOSPORIN) ointment Apply 1 application topically as needed for wound care.   Yes Historical Provider, MD  NON FORMULARY Place 1 patch onto the skin 2 (two) times a week.   Yes Historical Provider, MD  tamsulosin (FLOMAX) 0.4 MG CAPS capsule Take 0.4 mg by mouth daily after breakfast.   Yes Historical Provider, MD  traMADol (ULTRAM) 50 MG tablet Take 1 tablet (50 mg total) by mouth every 6 (six) hours as needed for moderate pain. 09/07/13  Yes Reyne Dumas, MD   BP 106/48  Pulse 66  Temp(Src) 99 F (37.2 C) (Rectal)  Resp 25  SpO2 98% Physical Exam  Nursing note and vitals reviewed. Constitutional: He is oriented to person, place, and time. He appears well-developed and well-nourished.  HENT:  Head: Normocephalic and atraumatic.  Right Ear: External ear normal.  Left Ear: External ear normal.  Nose: Nose normal.  Mouth/Throat: Oropharynx is clear and moist. No oropharyngeal exudate.  Eyes: Conjunctivae and EOM are normal. Pupils are equal, round, and reactive to light.  Neck: Normal  range of motion. Neck supple.  Cardiovascular: Normal rate, regular rhythm, normal heart sounds and intact distal pulses.  Exam reveals no gallop and no friction rub.   No murmur heard. Pulmonary/Chest: Effort normal. No respiratory distress. He has wheezes.  Mild end expiratory wheeze heard bilaterally. Mild rhonchi heard  bilaterally.  Abdominal: Soft. Bowel sounds are normal. He exhibits no distension. There is no tenderness. There is no rebound and no guarding.  Musculoskeletal: Normal range of motion. He exhibits edema (moderate pitting edema extending up to the thighs bilaterally. Not significant change from baseline per wife.). He exhibits no tenderness.  Mild size stage IV sacral ulcer with evidence of tunneling. There is a scant amount of yellowish/brown drainage from the small central tunnel.   AV fistula in RUE w/ palpable thrill and auscultated bruit.   Catheter to right upper chest w/ mild erythema and excoriation surrounding.  Neurological: He is alert and oriented to person, place, and time.  Skin: Skin is warm and  dry.  Psychiatric: He has a normal mood and affect. His behavior is normal.    ED Course  Procedures (including critical care time) Labs Review Labs Reviewed  CBC WITH DIFFERENTIAL - Abnormal; Notable for the following:    WBC 16.0 (*)    RBC 3.36 (*)    Hemoglobin 9.5 (*)    HCT 29.3 (*)    RDW 19.0 (*)    Neutrophils Relative % 92 (*)    Neutro Abs 14.5 (*)    Lymphocytes Relative 4 (*)    All other components within normal limits  COMPREHENSIVE METABOLIC PANEL - Abnormal; Notable for the following:    Potassium 3.1 (*)    Glucose, Bld 114 (*)    BUN 35 (*)    Creatinine, Ser 4.59 (*)    Calcium 7.7 (*)    Total Protein 5.1 (*)    Albumin 1.8 (*)    GFR calc non Af Amer 12 (*)    GFR calc Af Amer 14 (*)    Anion gap 16 (*)    All other components within normal limits    Imaging Review Dg Chest 2 View  10/16/2013   CLINICAL DATA:  Cough and  wheezing  EXAM: CHEST  2 VIEW  COMPARISON:  09/04/2013  FINDINGS: Right-sided dual lumen dialysis catheter is in place with tips over the right atrium. Heart size is mildly enlarged. There is persistent left greater than right lower lobe pulmonary parenchymal opacification, but increased since the prior exam. A few possible interstitial Kerley B-lines are noted at the lung periphery. Small left and trace right pleural effusions are noted. Allowing for differences in technique, there is no significant interval change. Left-sided pacer in place.  IMPRESSION: Increased bibasilar opacities which could indicate superimposed alveolar edema with coexistent interstitial edema and effusions.   Electronically Signed   By: Conchita Paris M.D.   On: 10/16/2013 13:19     EKG Interpretation   Date/Time:  Monday October 16 2013 11:36:08 EDT Ventricular Rate:  72 PR Interval:    QRS Duration: 121 QT Interval:  531 QTC Calculation: 581 R Axis:   68 Text Interpretation:  sinus rhythm Nonspecific intraventricular conduction  delay Anterior infarct, old Borderline repolarization abnormality No  significant change since last tracing Confirmed by Dreamer Carillo  MD, Morad Tal  ([id number]) on 10/16/2013 12:42:35 PM        MDM   Final diagnoses:  URI (upper respiratory infection)  Sacral ulcer, stage IV    12:35 PM 73 y.o. male w hx of ESRD on HD (MWF), pacemaker, DM who presents with request for evaluation of his decubitus ulcer. The patient has also had a cough productive of yellow-green sputum for the last week. He no shortness of breath mostly when outside but denies this on exam. He has some mild expiratory wheezing noted but is in no acute distress and has no appreciable increased worker breathing. He denies any fevers at home. He is afebrile and has a soft blood pressure here. He does have a history of low blood pressures. Will get screening labs and imaging.  3:37 PM: Pt well appearing on exam. No pna on CXR. Pt is  not hypoxic on RA. Not febrile w/ rectal temp. He does have an elev wbc count, but it appears that he always has an elev wbc count. Borderline BP's have resolved w/out tx. I do not think his sacral ulcer is infected, but he should f/u w/ the wound center. I had care mgmt  get him an appt next week. Will start him on an antibiotic for this cough given hx of copd and comorbidities. Pt's K is slightly low. Will have him call for HD tomorrow. I spoke w/ the Renal PA who gave me a number to call about scheduling HD, but no one answered.  I have discussed the diagnosis/risks/treatment options with the patient/wife and believe the pt to be eligible for discharge home to follow-up with HD tomorrow and wound center next week. We also discussed returning to the ED immediately if new or worsening sx occur. We discussed the sx which are most concerning (e.g., fever, inc sob, vomiting, diarrhea) that necessitate immediate return. Medications administered to the patient during their visit and any new prescriptions provided to the patient are listed below.  Medications given during this visit Medications  albuterol (PROVENTIL) (2.5 MG/3ML) 0.083% nebulizer solution 2.5 mg (2.5 mg Nebulization Given 10/16/13 1235)    New Prescriptions   MOXIFLOXACIN (AVELOX) 400 MG TABLET    Take 1 tablet (400 mg total) by mouth daily at 8 pm.     Blanchard Kelch, MD 10/16/13 [id number]

## 2013-10-17 NOTE — Progress Notes (Signed)
CM received phone call from [facility] pharmacist Junior. He stated that the patient is currently on amiodarone and Avalox is contraindicated due to increased risk for arrythmia's. This Cm then spoke with ED PA [facility] and she prescribed a different antibiotic, a Zpak (azithromycin). This CM called Peterson Ao, pharmacist at [phone] and verbally gave the new antibiotic order. No other questions or concerns at this time. Edwyna Shell, RN, BSN, Case Manager 10/17/2013 8:42 AM

## 2013-10-26 ENCOUNTER — Encounter (HOSPITAL_BASED_OUTPATIENT_CLINIC_OR_DEPARTMENT_OTHER): Payer: Medicare HMO | Attending: Internal Medicine

## 2013-10-26 DIAGNOSIS — L8993 Pressure ulcer of unspecified site, stage 3: Secondary | ICD-10-CM | POA: Insufficient documentation

## 2013-10-26 DIAGNOSIS — Z992 Dependence on renal dialysis: Secondary | ICD-10-CM | POA: Insufficient documentation

## 2013-10-26 DIAGNOSIS — I129 Hypertensive chronic kidney disease with stage 1 through stage 4 chronic kidney disease, or unspecified chronic kidney disease: Secondary | ICD-10-CM | POA: Diagnosis not present

## 2013-10-26 DIAGNOSIS — N189 Chronic kidney disease, unspecified: Secondary | ICD-10-CM | POA: Insufficient documentation

## 2013-10-26 DIAGNOSIS — L89109 Pressure ulcer of unspecified part of back, unspecified stage: Secondary | ICD-10-CM | POA: Diagnosis not present

## 2013-10-26 DIAGNOSIS — Z85820 Personal history of malignant melanoma of skin: Secondary | ICD-10-CM | POA: Insufficient documentation

## 2013-10-26 DIAGNOSIS — E119 Type 2 diabetes mellitus without complications: Secondary | ICD-10-CM | POA: Insufficient documentation

## 2013-10-30 NOTE — Progress Notes (Signed)
[facility]  NAME:  Scott [name], Scott [name]                     ACCOUNT NO.:  MEDICAL RECORD NO.:  [id number]      DATE OF BIRTH:  12/22/1940  PHYSICIAN:  Judene Companion, M.D.           VISIT DATE:                                  OFFICE VISIT   [name] is a gentleman who has had a great deal of problems with his health recently, this man is a diabetic.  He has renal failure and he is on dialysis.  He also has a history of metastatic melanoma.  He comes here with a pressure ulcer on his sacrum which has a small opening, but after I took the necrotic tissue away, it has an opening over 1 cm, but a lot of undermining up to 3 cm.  We felt like we packed this with Iodoform and we are going to order him a wound VAC and we talked to Henrene Pastor of the wound VAC people, and he is going to take care of getting this ordered for [place].  I think with the combination of the negative pressure and the foam, we can help this become smaller.  He does not ambulate very well, although he can move his legs.  He lays in bed most of the time.  Today, we put a DuoDERM on here, and we are going to try to keep pressure off.  This man also has hypertension, and he is on amiodarone, acyclovir, ferrous sulfate.  He is also on Flomax and Ultram.  We found that he had vital signs with a blood pressure of 135/70, temperature 98, pulse 70, he weighs 142 pounds.  He is with his wife and he is very weak and very sick gentleman, and we are going to have him to come back here in a week.     Judene Companion, M.D.     PP/MEDQ  D:  10/26/2013  T:  10/27/2013  Job:  [id number]

## 2013-11-02 DIAGNOSIS — E119 Type 2 diabetes mellitus without complications: Secondary | ICD-10-CM | POA: Diagnosis not present

## 2013-11-02 DIAGNOSIS — L8993 Pressure ulcer of unspecified site, stage 3: Secondary | ICD-10-CM | POA: Diagnosis not present

## 2013-11-02 DIAGNOSIS — Z992 Dependence on renal dialysis: Secondary | ICD-10-CM | POA: Diagnosis not present

## 2013-11-02 DIAGNOSIS — L89109 Pressure ulcer of unspecified part of back, unspecified stage: Secondary | ICD-10-CM | POA: Diagnosis not present

## 2013-11-02 DIAGNOSIS — Z85820 Personal history of malignant melanoma of skin: Secondary | ICD-10-CM | POA: Diagnosis not present

## 2013-11-02 DIAGNOSIS — I129 Hypertensive chronic kidney disease with stage 1 through stage 4 chronic kidney disease, or unspecified chronic kidney disease: Secondary | ICD-10-CM | POA: Diagnosis not present

## 2013-11-02 DIAGNOSIS — N189 Chronic kidney disease, unspecified: Secondary | ICD-10-CM | POA: Diagnosis not present

## 2013-11-04 ENCOUNTER — Inpatient Hospital Stay (HOSPITAL_COMMUNITY)
Admission: EM | Admit: 2013-11-04 | Discharge: 2013-11-08 | DRG: 871 | Disposition: A | Discharge status: Home / Self Care | Payer: Medicare HMO | Attending: Internal Medicine | Admitting: Internal Medicine

## 2013-11-04 ENCOUNTER — Emergency Department (HOSPITAL_COMMUNITY): Payer: Medicare HMO

## 2013-11-04 ENCOUNTER — Encounter (HOSPITAL_COMMUNITY): Payer: Self-pay | Admitting: Emergency Medicine

## 2013-11-04 DIAGNOSIS — Z6825 Body mass index (BMI) 25.0-25.9, adult: Secondary | ICD-10-CM

## 2013-11-04 DIAGNOSIS — F17200 Nicotine dependence, unspecified, uncomplicated: Secondary | ICD-10-CM | POA: Diagnosis present

## 2013-11-04 DIAGNOSIS — L89109 Pressure ulcer of unspecified part of back, unspecified stage: Secondary | ICD-10-CM | POA: Diagnosis present

## 2013-11-04 DIAGNOSIS — Z791 Long term (current) use of non-steroidal anti-inflammatories (NSAID): Secondary | ICD-10-CM

## 2013-11-04 DIAGNOSIS — L89154 Pressure ulcer of sacral region, stage 4: Secondary | ICD-10-CM | POA: Diagnosis present

## 2013-11-04 DIAGNOSIS — L89309 Pressure ulcer of unspecified buttock, unspecified stage: Secondary | ICD-10-CM | POA: Diagnosis present

## 2013-11-04 DIAGNOSIS — D649 Anemia, unspecified: Secondary | ICD-10-CM | POA: Diagnosis present

## 2013-11-04 DIAGNOSIS — Z833 Family history of diabetes mellitus: Secondary | ICD-10-CM

## 2013-11-04 DIAGNOSIS — R6520 Severe sepsis without septic shock: Secondary | ICD-10-CM | POA: Diagnosis present

## 2013-11-04 DIAGNOSIS — A419 Sepsis, unspecified organism: Secondary | ICD-10-CM | POA: Diagnosis not present

## 2013-11-04 DIAGNOSIS — J9601 Acute respiratory failure with hypoxia: Secondary | ICD-10-CM

## 2013-11-04 DIAGNOSIS — IMO0002 Reserved for concepts with insufficient information to code with codable children: Secondary | ICD-10-CM

## 2013-11-04 DIAGNOSIS — J189 Pneumonia, unspecified organism: Secondary | ICD-10-CM | POA: Diagnosis present

## 2013-11-04 DIAGNOSIS — E119 Type 2 diabetes mellitus without complications: Secondary | ICD-10-CM | POA: Diagnosis present

## 2013-11-04 DIAGNOSIS — N186 End stage renal disease: Secondary | ICD-10-CM | POA: Diagnosis present

## 2013-11-04 DIAGNOSIS — M949 Disorder of cartilage, unspecified: Secondary | ICD-10-CM

## 2013-11-04 DIAGNOSIS — I959 Hypotension, unspecified: Secondary | ICD-10-CM

## 2013-11-04 DIAGNOSIS — Z79899 Other long term (current) drug therapy: Secondary | ICD-10-CM

## 2013-11-04 DIAGNOSIS — M899 Disorder of bone, unspecified: Secondary | ICD-10-CM | POA: Diagnosis present

## 2013-11-04 DIAGNOSIS — L8993 Pressure ulcer of unspecified site, stage 3: Secondary | ICD-10-CM | POA: Diagnosis present

## 2013-11-04 DIAGNOSIS — L8994 Pressure ulcer of unspecified site, stage 4: Secondary | ICD-10-CM | POA: Diagnosis present

## 2013-11-04 DIAGNOSIS — R6521 Severe sepsis with septic shock: Secondary | ICD-10-CM

## 2013-11-04 DIAGNOSIS — C9000 Multiple myeloma not having achieved remission: Secondary | ICD-10-CM | POA: Diagnosis present

## 2013-11-04 DIAGNOSIS — N2581 Secondary hyperparathyroidism of renal origin: Secondary | ICD-10-CM | POA: Diagnosis present

## 2013-11-04 DIAGNOSIS — Z992 Dependence on renal dialysis: Secondary | ICD-10-CM

## 2013-11-04 DIAGNOSIS — D696 Thrombocytopenia, unspecified: Secondary | ICD-10-CM | POA: Diagnosis present

## 2013-11-04 LAB — CBC WITH DIFFERENTIAL/PLATELET
BASOS ABS: 0.1 10*3/uL (ref 0.0–0.1)
BASOS PCT: 0 % (ref 0–1)
EOS PCT: 2 % (ref 0–5)
Eosinophils Absolute: 0.2 10*3/uL (ref 0.0–0.7)
HEMATOCRIT: 36.6 % — AB (ref 39.0–52.0)
Hemoglobin: 11.2 g/dL — ABNORMAL LOW (ref 13.0–17.0)
Lymphocytes Relative: 6 % — ABNORMAL LOW (ref 12–46)
Lymphs Abs: 0.8 10*3/uL (ref 0.7–4.0)
MCH: 26.5 pg (ref 26.0–34.0)
MCHC: 30.6 g/dL (ref 30.0–36.0)
MCV: 86.5 fL (ref 78.0–100.0)
Monocytes Absolute: 0.8 10*3/uL (ref 0.1–1.0)
Monocytes Relative: 6 % (ref 3–12)
Neutro Abs: 11.8 10*3/uL — ABNORMAL HIGH (ref 1.7–7.7)
Neutrophils Relative %: 86 % — ABNORMAL HIGH (ref 43–77)
PLATELETS: 139 10*3/uL — AB (ref 150–400)
RBC: 4.23 MIL/uL (ref 4.22–5.81)
RDW: 20.2 % — AB (ref 11.5–15.5)
WBC: 13.7 10*3/uL — ABNORMAL HIGH (ref 4.0–10.5)

## 2013-11-04 LAB — COMPREHENSIVE METABOLIC PANEL
ALBUMIN: 2 g/dL — AB (ref 3.5–5.2)
ALK PHOS: 76 U/L (ref 39–117)
ALT: 12 U/L (ref 0–53)
AST: 14 U/L (ref 0–37)
Anion gap: 22 — ABNORMAL HIGH (ref 5–15)
BUN: 25 mg/dL — ABNORMAL HIGH (ref 6–23)
CHLORIDE: 97 meq/L (ref 96–112)
CO2: 21 mEq/L (ref 19–32)
Calcium: 8.3 mg/dL — ABNORMAL LOW (ref 8.4–10.5)
Creatinine, Ser: 3.67 mg/dL — ABNORMAL HIGH (ref 0.50–1.35)
GFR calc Af Amer: 18 mL/min — ABNORMAL LOW (ref >90)
GFR calc non Af Amer: 15 mL/min — ABNORMAL LOW (ref >90)
Glucose, Bld: 100 mg/dL — ABNORMAL HIGH (ref 70–99)
POTASSIUM: 3.6 meq/L — AB (ref 3.7–5.3)
Sodium: 140 mEq/L (ref 137–147)
TOTAL PROTEIN: 5.5 g/dL — AB (ref 6.0–8.3)
Total Bilirubin: 0.7 mg/dL (ref 0.3–1.2)

## 2013-11-04 LAB — TROPONIN I

## 2013-11-04 MED ORDER — PIPERACILLIN-TAZOBACTAM IN DEX 2-0.25 GM/50ML IV SOLN
2.2500 g | Freq: Once | INTRAVENOUS | Status: AC
  Administered 2013-11-05: 2.25 g via INTRAVENOUS
  Filled 2013-11-04: qty 50

## 2013-11-04 MED ORDER — SODIUM CHLORIDE 0.9 % IV BOLUS (SEPSIS)
500.0000 mL | Freq: Once | INTRAVENOUS | Status: AC
  Administered 2013-11-04: 500 mL via INTRAVENOUS

## 2013-11-04 MED ORDER — VANCOMYCIN HCL IN DEXTROSE 1-5 GM/200ML-% IV SOLN
1000.0000 mg | Freq: Once | INTRAVENOUS | Status: AC
  Administered 2013-11-04: 1000 mg via INTRAVENOUS
  Filled 2013-11-04: qty 200

## 2013-11-04 NOTE — ED Notes (Signed)
Phlebotomy at bedside drawing blood. 

## 2013-11-04 NOTE — ED Provider Notes (Signed)
CSN: [id number]     Arrival date & time 11/04/13  2152 History   First MD Initiated Contact with Patient 11/04/13 2302     Chief Complaint  Patient presents with  . Chest Pain     (Consider location/radiation/quality/duration/timing/severity/associated sxs/prior Treatment) HPI 73 yo dialysis patient with permacath, history of multiple myeloma, DM and overall deconditioning here with malaise, chills. He denies cough but feels intermittently SOB. No chest pain. He was last dialyzed on Friday and received full run. He began to feel poorly today.   He denies pain in any region. He has a sacral ulcer which is followed at the [facility]. The wife has been changing the packing and has not noticed an odor or change to the wound.    Past Medical History  Diagnosis Date  . Renal disorder   . Pacemaker   . Shortness of breath     "when I take one of my chemo pills" (03/17/2013)  . Protein calorie malnutrition   . Multiple myeloma   . Skin cancer of face     "once; left side of my face" (03/17/2013)  . Pneumonia 06/09/2013  . Diabetes mellitus without complication     no meds  . Arthritis     "little in my right shoulder" (03/17/2013)  . History of blood transfusion   . Daily headache     denies  . Cataract    Past Surgical History  Procedure Laterality Date  . Appendectomy    . Kidney surgery Right 2000's    "cut out ~ 1/2"   . Insert / replace / remove pacemaker    . Bascilic vein transposition Right 07/10/2013    Procedure: BASILIC VEIN TRANSPOSITION- FIRST STAGE ;  Surgeon: Conrad [place], MD;  Location: [facility];  Service: Vascular;  Laterality: Right;  . Insertion of dialysis catheter Right 08/28/2013    Procedure: INSERTION OF DIALYSIS CATHETER in Right Internal Jugular;  Surgeon: [name], MD;  Location: [facility];  Service: Vascular;  Laterality: Right;  . Bascilic vein transposition Right 09/01/2013    Procedure: BASILIC VEIN TRANSPOSITION - 2ND STAGE AVF CREATION;  Surgeon: [name], MD;  Location: [place];  Service: Vascular;  Laterality: Right;  . Av fistula placement     Family History  Problem Relation Age of Onset  . Diabetes Mellitus II Father    History  Substance Use Topics  . Smoking status: Current Every Day Smoker -- 0.25 packs/day for 60 years    Types: Cigarettes  . Smokeless tobacco: Never Used     Comment: 03/17/2013 "smoked a cigarette yesterday; sometimes none"  . Alcohol Use: No    Review of Systems  10 point review of symptoms obtained and is negative with the exceptions of symptoms noted abov.e    Allergies  Review of patient's allergies indicates no known allergies.  Home Medications   Prior to Admission medications   Medication Sig Start Date End Date Taking? Authorizing Provider  ACCU-CHEK AVIVA PLUS test strip 1 each by Other route See admin instructions. Check blood sugar once daily. 05/31/13  Yes Historical Provider, MD  ACCU-CHEK SOFTCLIX LANCETS lancets 1 each by Other route See admin instructions. Check blood sugar once daily. 05/31/13  Yes Historical Provider, MD  acyclovir (ZOVIRAX) 400 MG tablet Take 400 mg by mouth daily.   Yes Historical Provider, MD  amiodarone (PACERONE) 200 MG tablet Take 200 mg by mouth daily.   Yes Historical Provider, MD  dexamethasone (DECADRON) 4 MG tablet Take 40 mg by mouth every 7 (seven) days. Every Tuesday.   Yes Historical Provider, MD  ferrous sulfate 325 (65 FE) MG tablet Take 325 mg by mouth daily with breakfast.   Yes Historical Provider, MD  ibuprofen (ADVIL,MOTRIN) 200 MG tablet Take 200 mg by mouth every 6 (six) hours as needed.   Yes Historical Provider, MD  ipratropium-albuterol (DUONEB) 0.5-2.5 (3) MG/3ML SOLN Take 3 mLs by nebulization every 4 (four) hours as needed (for shortness of breath).   Yes Historical Provider, MD  megestrol (MEGACE) 40 MG/ML suspension Take 800 mg by mouth daily.   Yes Historical Provider, MD  Menthol-Methyl Salicylate (MUSCLE RUB EX) Apply 1 application  topically as needed. For muscle cramps 07/24/13  Yes Historical Provider, MD  midodrine (PROAMATINE) 10 MG tablet Take 1 tablet (10 mg total) by mouth 3 (three) times daily with meals. 09/07/13  Yes Reyne Dumas, MD  traMADol (ULTRAM) 50 MG tablet Take 1 tablet (50 mg total) by mouth every 6 (six) hours as needed for moderate pain. 09/07/13  Yes Reyne Dumas, MD   BP 92/49  Pulse 86  Temp(Src) 98.1 F (36.7 C)  Resp 22  SpO2 99% Physical Exam  Gen: well nourished and well developed appearing, acutely and chronically ill appearing.  Head: NCAT Ears: normal to inspection Nose: normal to inspection, no epistaxis or drainage Mouth: oral mucsoa is well hydrated appearing, normal posterior oropharynx Neck: supple, no stridor CV: rapid and regular, pulse 112 bpm, no murmur, palpable peripheral pulses Resp: RR 32/min, lung sounds are clear to auscultation bilaterally, no wheeing or rhonchi or rales, normal respiratory effort.  Abd: soft, nontender, nondistended Back: kyphosis, no midline ttp, approx 45m wide sacral decubitus ulcer with approx 1cm margin of erythema without ttp, wound is packed, no drainage, no foul smell.  Extremities: normal to inspection, mild and symmetric nonpitting edema both LE.  Skin: warm and dry Neuro: CN ii - XII, no focal deficitis Psyche; flat  affect, cooperative.   ED Course  Procedures (including critical care time) Labs Review  Results for orders placed during the hospital encounter of 11/04/13 (from the past 24 hour(s))  CBC WITH DIFFERENTIAL     Status: Abnormal   Collection Time    11/04/13 10:35 PM      Result Value Ref Range   WBC 13.7 (*) 4.0 - 10.5 K/uL   RBC 4.23  4.22 - 5.81 MIL/uL   Hemoglobin 11.2 (*) 13.0 - 17.0 g/dL   HCT 36.6 (*) 39.0 - 52.0 %   MCV 86.5  78.0 - 100.0 fL   MCH 26.5  26.0 - 34.0 pg   MCHC 30.6  30.0 - 36.0 g/dL   RDW 20.2 (*) 11.5 - 15.5 %   Platelets 139 (*) 150 - 400 K/uL   Neutrophils Relative % 86 (*) 43 - 77 %    Neutro Abs 11.8 (*) 1.7 - 7.7 K/uL   Lymphocytes Relative 6 (*) 12 - 46 %   Lymphs Abs 0.8  0.7 - 4.0 K/uL   Monocytes Relative 6  3 - 12 %   Monocytes Absolute 0.8  0.1 - 1.0 K/uL   Eosinophils Relative 2  0 - 5 %   Eosinophils Absolute 0.2  0.0 - 0.7 K/uL   Basophils Relative 0  0 - 1 %   Basophils Absolute 0.1  0.0 - 0.1 K/uL  COMPREHENSIVE METABOLIC PANEL     Status: Abnormal   Collection Time  11/04/13 10:35 PM      Result Value Ref Range   Sodium 140  137 - 147 mEq/L   Potassium 3.6 (*) 3.7 - 5.3 mEq/L   Chloride 97  96 - 112 mEq/L   CO2 21  19 - 32 mEq/L   Glucose, Bld 100 (*) 70 - 99 mg/dL   BUN 25 (*) 6 - 23 mg/dL   Creatinine, Ser 3.67 (*) 0.50 - 1.35 mg/dL   Calcium 8.3 (*) 8.4 - 10.5 mg/dL   Total Protein 5.5 (*) 6.0 - 8.3 g/dL   Albumin 2.0 (*) 3.5 - 5.2 g/dL   AST 14  0 - 37 U/L   ALT 12  0 - 53 U/L   Alkaline Phosphatase 76  39 - 117 U/L   Total Bilirubin 0.7  0.3 - 1.2 mg/dL   GFR calc non Af Amer 15 (*) >90 mL/min   GFR calc Af Amer 18 (*) >90 mL/min   Anion gap 22 (*) 5 - 15  TROPONIN I     Status: None   Collection Time    11/04/13 10:36 PM      Result Value Ref Range   Troponin I <0.30  <0.30 ng/mL  PRO B NATRIURETIC PEPTIDE     Status: Abnormal   Collection Time    11/04/13 11:28 PM      Result Value Ref Range   Pro B Natriuretic peptide (BNP) 13759.0 (*) 0 - 125 pg/mL  MAGNESIUM     Status: None   Collection Time    11/04/13 11:28 PM      Result Value Ref Range   Magnesium 1.8  1.5 - 2.5 mg/dL  PHOSPHORUS     Status: None   Collection Time    11/04/13 11:28 PM      Result Value Ref Range   Phosphorus 3.4  2.3 - 4.6 mg/dL  I-STAT TROPOININ, ED     Status: None   Collection Time    11/05/13 12:06 AM      Result Value Ref Range   Troponin i, poc 0.06  0.00 - 0.08 ng/mL   Comment 3           I-STAT CHEM 8, ED     Status: Abnormal   Collection Time    11/05/13 12:08 AM      Result Value Ref Range   Sodium 137  137 - 147 mEq/L   Potassium  3.3 (*) 3.7 - 5.3 mEq/L   Chloride 102  96 - 112 mEq/L   BUN 23  6 - 23 mg/dL   Creatinine, Ser 3.90 (*) 0.50 - 1.35 mg/dL   Glucose, Bld 89  70 - 99 mg/dL   Calcium, Ion 0.99 (*) 1.13 - 1.30 mmol/L   TCO2 22  0 - 100 mmol/L   Hemoglobin 13.3  13.0 - 17.0 g/dL   HCT 39.0  39.0 - 52.0 %   DG Chest Portable 1 View (Final result)  Result time: 11/04/13 23:59:59    Final result by Rad Results In Interface (11/04/13 23:59:59)    Narrative:   CLINICAL DATA: Chest pain  EXAM: PORTABLE CHEST - 1 VIEW  COMPARISON: 10/16/2013  FINDINGS: Dual-chamber pacer leads from the left are in unremarkable position. There is a right IJ dialysis catheter, tip at the upper right atrium.  Normal heart size and negative upper mediastinal contours. There is a trace left pleural effusion with overlying bandlike opacity suggesting atelectasis. The right chest is clear. No  pneumothorax.  IMPRESSION: Small left pleural effusion with atelectasis, decreased from 10/16/2013.   Electronically Signed By: Jorje Guild M.D. On: 11/04/2013 23:59           EKG Interpretation   Date/Time:  Saturday November 04 2013 22:03:39 EDT Ventricular Rate:  122 PR Interval:    QRS Duration: 124 QT Interval:  350 QTC Calculation: 498 R Axis:   94 Text Interpretation:  Atrial fibrillation with rapid ventricular response  Rightward axis Anteroseptal infarct , age undetermined Marked ST  abnormality, possible inferior subendocardial injury Abnormal ECG  Confirmed by Christy Gentles  MD, DONALD ([id number]) on 11/04/2013 10:39:48 PM      CRITICAL CARE Performed by: Elyn Peers   Total critical care time:71m Critical care time was exclusive of separately billable procedures and treating other patients.  Critical care was necessary to treat or prevent imminent or life-threatening deterioration.  Critical care was time spent personally by me on the following activities: development of treatment plan with patient  and/or surrogate as well as nursing, discussions with consultants, evaluation of patient's response to treatment, examination of patient, obtaining history from patient or surrogate, ordering and performing treatments and interventions, ordering and review of laboratory studies, ordering and review of radiographic studies, pulse oximetry and re-evaluation of patient's condition.   MDM   Line sepsis, urosepsis, pneumonia, wound infection.   Good response to IVF. BP now acceptable although the patient remains tachycardic.   The patient is hypotensive and we are treating with volume resuscitation along with empiric antibiotics - Vanc and Zosyn. Code Sepsis alert initiated. Plan to admit.   Case discussed with Dr. JJennette Kettlewho will admit to the SDU.     JElyn Peers MD 11/05/13 [phone]

## 2013-11-04 NOTE — ED Notes (Signed)
The pt is c/o chest pain sob chills  Since yesterday he is dialysis and he was dialyzed Friday.  Fistula in his rt arm but he is being dialyzed through his dialysis cath.  The family member irep[orts that he feels worse today

## 2013-11-05 ENCOUNTER — Encounter (HOSPITAL_COMMUNITY): Payer: Self-pay | Admitting: *Deleted

## 2013-11-05 DIAGNOSIS — N2581 Secondary hyperparathyroidism of renal origin: Secondary | ICD-10-CM | POA: Diagnosis present

## 2013-11-05 DIAGNOSIS — N186 End stage renal disease: Secondary | ICD-10-CM

## 2013-11-05 DIAGNOSIS — Z79899 Other long term (current) drug therapy: Secondary | ICD-10-CM | POA: Diagnosis not present

## 2013-11-05 DIAGNOSIS — M949 Disorder of cartilage, unspecified: Secondary | ICD-10-CM | POA: Diagnosis present

## 2013-11-05 DIAGNOSIS — IMO0002 Reserved for concepts with insufficient information to code with codable children: Secondary | ICD-10-CM | POA: Diagnosis not present

## 2013-11-05 DIAGNOSIS — L89109 Pressure ulcer of unspecified part of back, unspecified stage: Secondary | ICD-10-CM | POA: Diagnosis present

## 2013-11-05 DIAGNOSIS — R6520 Severe sepsis without septic shock: Secondary | ICD-10-CM

## 2013-11-05 DIAGNOSIS — M899 Disorder of bone, unspecified: Secondary | ICD-10-CM | POA: Diagnosis present

## 2013-11-05 DIAGNOSIS — E119 Type 2 diabetes mellitus without complications: Secondary | ICD-10-CM | POA: Diagnosis present

## 2013-11-05 DIAGNOSIS — A419 Sepsis, unspecified organism: Secondary | ICD-10-CM | POA: Diagnosis present

## 2013-11-05 DIAGNOSIS — D696 Thrombocytopenia, unspecified: Secondary | ICD-10-CM | POA: Diagnosis present

## 2013-11-05 DIAGNOSIS — I959 Hypotension, unspecified: Secondary | ICD-10-CM | POA: Diagnosis present

## 2013-11-05 DIAGNOSIS — Z6825 Body mass index (BMI) 25.0-25.9, adult: Secondary | ICD-10-CM | POA: Diagnosis not present

## 2013-11-05 DIAGNOSIS — L8994 Pressure ulcer of unspecified site, stage 4: Secondary | ICD-10-CM | POA: Diagnosis present

## 2013-11-05 DIAGNOSIS — Z833 Family history of diabetes mellitus: Secondary | ICD-10-CM | POA: Diagnosis not present

## 2013-11-05 DIAGNOSIS — Z791 Long term (current) use of non-steroidal anti-inflammatories (NSAID): Secondary | ICD-10-CM | POA: Diagnosis not present

## 2013-11-05 DIAGNOSIS — D649 Anemia, unspecified: Secondary | ICD-10-CM | POA: Diagnosis present

## 2013-11-05 DIAGNOSIS — C9000 Multiple myeloma not having achieved remission: Secondary | ICD-10-CM | POA: Diagnosis present

## 2013-11-05 DIAGNOSIS — L89309 Pressure ulcer of unspecified buttock, unspecified stage: Secondary | ICD-10-CM | POA: Diagnosis present

## 2013-11-05 DIAGNOSIS — F17200 Nicotine dependence, unspecified, uncomplicated: Secondary | ICD-10-CM | POA: Diagnosis present

## 2013-11-05 DIAGNOSIS — J189 Pneumonia, unspecified organism: Secondary | ICD-10-CM | POA: Diagnosis present

## 2013-11-05 DIAGNOSIS — L8993 Pressure ulcer of unspecified site, stage 3: Secondary | ICD-10-CM | POA: Diagnosis present

## 2013-11-05 LAB — URINALYSIS, ROUTINE W REFLEX MICROSCOPIC
Bilirubin Urine: NEGATIVE
GLUCOSE, UA: NEGATIVE mg/dL
KETONES UR: 15 mg/dL — AB
NITRITE: POSITIVE — AB
Protein, ur: 100 mg/dL — AB
Specific Gravity, Urine: 1.015 (ref 1.005–1.030)
UROBILINOGEN UA: 1 mg/dL (ref 0.0–1.0)
pH: 7.5 (ref 5.0–8.0)

## 2013-11-05 LAB — I-STAT CHEM 8, ED
BUN: 23 mg/dL (ref 6–23)
Calcium, Ion: 0.99 mmol/L — ABNORMAL LOW (ref 1.13–1.30)
Chloride: 102 mEq/L (ref 96–112)
Creatinine, Ser: 3.9 mg/dL — ABNORMAL HIGH (ref 0.50–1.35)
Glucose, Bld: 89 mg/dL (ref 70–99)
HCT: 39 % (ref 39.0–52.0)
HEMOGLOBIN: 13.3 g/dL (ref 13.0–17.0)
Potassium: 3.3 mEq/L — ABNORMAL LOW (ref 3.7–5.3)
SODIUM: 137 meq/L (ref 137–147)
TCO2: 22 mmol/L (ref 0–100)

## 2013-11-05 LAB — CBC
HEMATOCRIT: 31.4 % — AB (ref 39.0–52.0)
HEMOGLOBIN: 9.9 g/dL — AB (ref 13.0–17.0)
MCH: 27.3 pg (ref 26.0–34.0)
MCHC: 31.5 g/dL (ref 30.0–36.0)
MCV: 86.7 fL (ref 78.0–100.0)
Platelets: DECREASED 10*3/uL (ref 150–400)
RBC: 3.62 MIL/uL — AB (ref 4.22–5.81)
RDW: 20.4 % — ABNORMAL HIGH (ref 11.5–15.5)
WBC: 14.5 10*3/uL — ABNORMAL HIGH (ref 4.0–10.5)

## 2013-11-05 LAB — BASIC METABOLIC PANEL
Anion gap: 14 (ref 5–15)
BUN: 28 mg/dL — AB (ref 6–23)
CO2: 24 meq/L (ref 19–32)
Calcium: 7.8 mg/dL — ABNORMAL LOW (ref 8.4–10.5)
Chloride: 102 mEq/L (ref 96–112)
Creatinine, Ser: 3.91 mg/dL — ABNORMAL HIGH (ref 0.50–1.35)
GFR calc Af Amer: 16 mL/min — ABNORMAL LOW (ref >90)
GFR, EST NON AFRICAN AMERICAN: 14 mL/min — AB (ref >90)
GLUCOSE: 72 mg/dL (ref 70–99)
POTASSIUM: 3.7 meq/L (ref 3.7–5.3)
Sodium: 140 mEq/L (ref 137–147)

## 2013-11-05 LAB — PHOSPHORUS: Phosphorus: 3.4 mg/dL (ref 2.3–4.6)

## 2013-11-05 LAB — URINE MICROSCOPIC-ADD ON

## 2013-11-05 LAB — LACTIC ACID, PLASMA: LACTIC ACID, VENOUS: 5.9 mmol/L — AB (ref 0.5–2.2)

## 2013-11-05 LAB — MAGNESIUM: Magnesium: 1.8 mg/dL (ref 1.5–2.5)

## 2013-11-05 LAB — MRSA PCR SCREENING: MRSA by PCR: POSITIVE — AB

## 2013-11-05 LAB — PRO B NATRIURETIC PEPTIDE: PRO B NATRI PEPTIDE: 13759 pg/mL — AB (ref 0–125)

## 2013-11-05 LAB — I-STAT TROPONIN, ED: Troponin i, poc: 0.06 ng/mL (ref 0.00–0.08)

## 2013-11-05 MED ORDER — DEXAMETHASONE 6 MG PO TABS: 40.0000 mg | ORAL_TABLET | ORAL | Status: DC

## 2013-11-05 MED ORDER — VANCOMYCIN HCL 500 MG IV SOLR
500.0000 mg | Freq: Once | INTRAVENOUS | Status: AC
  Administered 2013-11-05: 500 mg via INTRAVENOUS
  Filled 2013-11-05: qty 500

## 2013-11-05 MED ORDER — ACYCLOVIR 200 MG PO CAPS
400.0000 mg | ORAL_CAPSULE | Freq: Every day | ORAL | Status: DC
  Administered 2013-11-05 – 2013-11-08 (×4): 400 mg via ORAL
  Filled 2013-11-05 (×4): qty 2

## 2013-11-05 MED ORDER — HEPARIN SODIUM (PORCINE) 5000 UNIT/ML IJ SOLN
5000.0000 [IU] | Freq: Three times a day (TID) | INTRAMUSCULAR | Status: DC
  Administered 2013-11-05 – 2013-11-08 (×11): 5000 [IU] via SUBCUTANEOUS
  Filled 2013-11-05 (×12): qty 1

## 2013-11-05 MED ORDER — AMIODARONE HCL 200 MG PO TABS
200.0000 mg | ORAL_TABLET | Freq: Every day | ORAL | Status: DC
  Administered 2013-11-05 – 2013-11-08 (×4): 200 mg via ORAL
  Filled 2013-11-05 (×4): qty 1

## 2013-11-05 MED ORDER — PIPERACILLIN-TAZOBACTAM IN DEX 2-0.25 GM/50ML IV SOLN
2.2500 g | Freq: Three times a day (TID) | INTRAVENOUS | Status: DC
  Administered 2013-11-05 – 2013-11-08 (×10): 2.25 g via INTRAVENOUS
  Filled 2013-11-05 (×12): qty 50

## 2013-11-05 MED ORDER — FERROUS SULFATE 325 (65 FE) MG PO TABS
325.0000 mg | ORAL_TABLET | Freq: Every day | ORAL | Status: DC
  Administered 2013-11-05 – 2013-11-08 (×4): 325 mg via ORAL
  Filled 2013-11-05 (×5): qty 1

## 2013-11-05 MED ORDER — DARBEPOETIN ALFA-POLYSORBATE 200 MCG/0.4ML IJ SOLN
160.0000 ug | INTRAMUSCULAR | Status: DC
  Filled 2013-11-05: qty 0.4

## 2013-11-05 MED ORDER — IBUPROFEN 200 MG PO TABS
200.0000 mg | ORAL_TABLET | Freq: Four times a day (QID) | ORAL | Status: DC | PRN
  Filled 2013-11-05: qty 1

## 2013-11-05 MED ORDER — TRAMADOL HCL 50 MG PO TABS: 50.0000 mg | ORAL_TABLET | Freq: Two times a day (BID) | ORAL | Status: DC | PRN

## 2013-11-05 MED ORDER — CHLORHEXIDINE GLUCONATE CLOTH 2 % EX PADS
6.0000 | MEDICATED_PAD | Freq: Every day | CUTANEOUS | Status: DC
  Administered 2013-11-06 – 2013-11-07 (×2): 6 via TOPICAL

## 2013-11-05 MED ORDER — SODIUM CHLORIDE 0.9 % IV SOLN
62.5000 mg | INTRAVENOUS | Status: DC
  Administered 2013-11-08: 62.5 mg via INTRAVENOUS
  Filled 2013-11-05: qty 5

## 2013-11-05 MED ORDER — ACYCLOVIR 400 MG PO TABS
400.0000 mg | ORAL_TABLET | Freq: Every day | ORAL | Status: DC
  Filled 2013-11-05: qty 1

## 2013-11-05 MED ORDER — RENA-VITE PO TABS
1.0000 | ORAL_TABLET | Freq: Every day | ORAL | Status: DC
  Administered 2013-11-05 – 2013-11-07 (×3): 1 via ORAL
  Filled 2013-11-05 (×4): qty 1

## 2013-11-05 MED ORDER — SODIUM CHLORIDE 0.9 % IJ SOLN
3.0000 mL | Freq: Two times a day (BID) | INTRAMUSCULAR | Status: DC
  Administered 2013-11-05 – 2013-11-07 (×6): 3 mL via INTRAVENOUS

## 2013-11-05 MED ORDER — VANCOMYCIN HCL IN DEXTROSE 750-5 MG/150ML-% IV SOLN
750.0000 mg | INTRAVENOUS | Status: DC
Start: 2013-11-06 — End: 2013-11-08
  Administered 2013-11-06: 750 mg via INTRAVENOUS
  Filled 2013-11-05 (×3): qty 150

## 2013-11-05 MED ORDER — TRAMADOL HCL 50 MG PO TABS: 50.0000 mg | ORAL_TABLET | Freq: Four times a day (QID) | ORAL | Status: DC | PRN

## 2013-11-05 MED ORDER — DEXAMETHASONE 6 MG PO TABS
40.0000 mg | ORAL_TABLET | ORAL | Status: DC
  Administered 2013-11-07: 40 mg via ORAL
  Filled 2013-11-05: qty 1

## 2013-11-05 MED ORDER — SODIUM CHLORIDE 0.9 % IV SOLN
INTRAVENOUS | Status: DC
  Administered 2013-11-05: 03:00:00 via INTRAVENOUS

## 2013-11-05 MED ORDER — IPRATROPIUM-ALBUTEROL 0.5-2.5 (3) MG/3ML IN SOLN: 3.0000 mL | RESPIRATORY_TRACT | Status: DC | PRN

## 2013-11-05 MED ORDER — MIDODRINE HCL 5 MG PO TABS
10.0000 mg | ORAL_TABLET | Freq: Three times a day (TID) | ORAL | Status: DC
  Administered 2013-11-05 – 2013-11-08 (×9): 10 mg via ORAL
  Filled 2013-11-05 (×15): qty 2

## 2013-11-05 MED ORDER — MEGESTROL ACETATE 40 MG/ML PO SUSP
800.0000 mg | Freq: Every day | ORAL | Status: DC
  Administered 2013-11-05 – 2013-11-08 (×4): 800 mg via ORAL
  Filled 2013-11-05 (×4): qty 20

## 2013-11-05 MED ORDER — MUPIROCIN 2 % EX OINT
1.0000 "application " | TOPICAL_OINTMENT | Freq: Two times a day (BID) | CUTANEOUS | Status: DC
  Administered 2013-11-05 – 2013-11-08 (×7): 1 via NASAL
  Filled 2013-11-05: qty 22

## 2013-11-05 NOTE — ED Notes (Signed)
Pt remains alert and stable- denies any complaints at this time.

## 2013-11-05 NOTE — ED Notes (Signed)
Pt not able to void at this time pt is aware of needed void

## 2013-11-05 NOTE — ED Notes (Signed)
Spoke to [place] with patient placement, anticipating room assignment within the next hour.

## 2013-11-05 NOTE — Progress Notes (Signed)
ANTIBIOTIC CONSULT NOTE - INITIAL  Pharmacy Consult for Vancomycin and Zosyn  Indication: rule out sepsis  No Known Allergies  Patient Measurements: Height: 5' 4.96" (165 cm) Weight: 154 lb 5.2 oz (70 kg) IBW/kg (Calculated) : 61.41  Vital Signs: Temp: 97.6 F (36.4 C) (08/23 0009) Temp src: Oral (08/23 0009) BP: 91/56 mmHg (08/23 0025) Pulse Rate: 105 (08/23 0017) Intake/Output from previous day:   Intake/Output from this shift:    Labs:  Recent Labs  11/04/13 2235 11/05/13 0008  WBC 13.7*  --   HGB 11.2* 13.3  PLT 139*  --   CREATININE 3.67* 3.90*   Estimated Creatinine Clearance: 15.1 ml/min (by C-G formula based on Cr of 3.9). No results found for this basename: VANCOTROUGH, VANCOPEAK, VANCORANDOM, GENTTROUGH, GENTPEAK, GENTRANDOM, TOBRATROUGH, TOBRAPEAK, TOBRARND, AMIKACINPEAK, AMIKACINTROU, AMIKACIN,  in the last 72 hours   Microbiology: No results found for this or any previous visit (from the past 720 hour(s)).  Medical History: Past Medical History  Diagnosis Date  . Renal disorder   . Pacemaker   . Shortness of breath     "when I take one of my chemo pills" (03/17/2013)  . Protein calorie malnutrition   . Multiple myeloma   . Skin cancer of face     "once; left side of my face" (03/17/2013)  . Pneumonia 06/09/2013  . Diabetes mellitus without complication     no meds  . Arthritis     "little in my right shoulder" (03/17/2013)  . History of blood transfusion   . Daily headache     denies  . Cataract    Medications:  Acyclovir  Amiodarone  Dexamethasone  Iron  Duoneb  Megace  Midodrine  Ultram  Assessment: 73 yo male with fever/chills/hypotension, possible sepsis, for empiric antibiotics  Vancomycin 1 g IV given in ED at 2330  Goal of Therapy:  Vancomycin pre-HD Level 15-25  Plan:  Vancomycin 500 mg IV  for total of 1500 mg IV tonight, then 750 mg IV after qHD  Zosyn 2.25 g IV q8h  Caryl Pina 11/05/2013,12:36 AM

## 2013-11-05 NOTE — Progress Notes (Signed)
Offered Pt a bath, Pt. declined

## 2013-11-05 NOTE — H&P (Signed)
[facility] History and Physical  Scott [name] FYB:[id number] DOB: [birth year] DOA: 11/04/2013  Referring physician: EDP PCP: Bufford Buttner, MD   Chief Complaint: Fever, chills   HPI: Scott [name] is a 73 y.o. 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  Denies cough but does have intermittent SOB.  No chest pain.  Was dialized on Friday (next scheduled dialysis is Monday), had full run of dialysis on Friday.  Dialysis access is via a temporary vas cath in his RIJ which is being used while a R brachial fistula matures.  He began to feel poorly today at home.  He does have some lower back pain but other than that denies pain anywhere else.  Does have a sacral decubitus ulcer which is followed by wound care clinic.  Wife has been changing dressing and has not noticed any odor nor change in the wound.  In ED initially SBP was running as low as in the 60s, this improved to the low 100s after just 500cc bolus of NS.  BCx obtained, and empiric vanc and zosyn started.  Review of Systems: Systems reviewed.  As above, otherwise negative  Past Medical History  Diagnosis Date  . Renal disorder   . Pacemaker   . Shortness of breath     "when I take one of my chemo pills" (03/17/2013)  . Protein calorie malnutrition   . Multiple myeloma   . Skin cancer of face     "once; left side of my face" (03/17/2013)  . Pneumonia 06/09/2013  . Diabetes mellitus without complication     no meds  . Arthritis     "little in my right shoulder" (03/17/2013)  . History of blood transfusion   . Daily headache     denies  . Cataract    Past Surgical History  Procedure Laterality Date  . Appendectomy    . Kidney surgery Right 2000's    "cut out ~ 1/2"   . Insert / replace / remove pacemaker    . Bascilic vein transposition Right 07/10/2013    Procedure: BASILIC VEIN TRANSPOSITION- FIRST STAGE ;  Surgeon: Conrad [place], MD;  Location: [facility];  Service:  Vascular;  Laterality: Right;  . Insertion of dialysis catheter Right 08/28/2013    Procedure: INSERTION OF DIALYSIS CATHETER in Right Internal Jugular;  Surgeon: [name], MD;  Location: [facility];  Service: Vascular;  Laterality: Right;  . Bascilic vein transposition Right 09/01/2013    Procedure: BASILIC VEIN TRANSPOSITION - 2ND STAGE AVF CREATION;  Surgeon: [name], MD;  Location: [facility];  Service: Vascular;  Laterality: Right;  . Av fistula placement     Social History:  reports that he has been smoking Cigarettes.  He has a 15 pack-year smoking history. He has never used smokeless tobacco. He reports that he does not drink alcohol or use illicit drugs.  No Known Allergies  Family History  Problem Relation Age of Onset  . Diabetes Mellitus II Father      Prior to Admission medications   Medication Sig Start Date End Date Taking? Authorizing Provider  ACCU-CHEK AVIVA PLUS test strip 1 each by Other route See admin instructions. Check blood sugar once daily. 05/31/13  Yes Historical Provider, MD  ACCU-CHEK SOFTCLIX LANCETS lancets 1 each by Other route See admin instructions. Check blood sugar once daily. 05/31/13  Yes Historical Provider, MD  acyclovir (ZOVIRAX) 400 MG tablet Take 400  mg by mouth daily.   Yes Historical Provider, MD  amiodarone (PACERONE) 200 MG tablet Take 200 mg by mouth daily.   Yes Historical Provider, MD  dexamethasone (DECADRON) 4 MG tablet Take 40 mg by mouth every 7 (seven) days. Every Tuesday.   Yes Historical Provider, MD  ferrous sulfate 325 (65 FE) MG tablet Take 325 mg by mouth daily with breakfast.   Yes Historical Provider, MD  ibuprofen (ADVIL,MOTRIN) 200 MG tablet Take 200 mg by mouth every 6 (six) hours as needed.   Yes Historical Provider, MD  ipratropium-albuterol (DUONEB) 0.5-2.5 (3) MG/3ML SOLN Take 3 mLs by nebulization every 4 (four) hours as needed (for shortness of breath).   Yes Historical Provider, MD  megestrol (MEGACE) 40 MG/ML suspension  Take 800 mg by mouth daily.   Yes Historical Provider, MD  Menthol-Methyl Salicylate (MUSCLE RUB EX) Apply 1 application topically as needed. For muscle cramps 07/24/13  Yes Historical Provider, MD  midodrine (PROAMATINE) 10 MG tablet Take 1 tablet (10 mg total) by mouth 3 (three) times daily with meals. 09/07/13  Yes Reyne Dumas, MD  traMADol (ULTRAM) 50 MG tablet Take 1 tablet (50 mg total) by mouth every 6 (six) hours as needed for moderate pain. 09/07/13  Yes Reyne Dumas, MD   Physical Exam: Filed Vitals:   11/05/13 0037  BP: 102/60  Pulse:   Temp:   Resp: 18    BP 102/60  Pulse 105  Temp(Src) 97.6 F (36.4 C) (Oral)  Resp 18  Ht 5' 4.96" (1.65 m)  Wt 70 kg (154 lb 5.2 oz)  BMI 25.71 kg/m2  SpO2 99%  General Appearance:    Alert, oriented, no distress, appears stated age  Head:    Normocephalic, atraumatic  Eyes:    PERRL, EOMI, sclera non-icteric        Nose:   Nares without drainage or epistaxis. Mucosa, turbinates normal  Throat:   Moist mucous membranes. Oropharynx without erythema or exudate.  Neck:   Supple. No carotid bruits.  No thyromegaly.  No lymphadenopathy.   Back:     No CVA tenderness, no spinal tenderness  Lungs:     Clear to auscultation bilaterally, without wheezes, rhonchi or rales  Chest wall:    No tenderness to palpitation, no erythema about vas cath, vascath site is non tender.  Heart:    Regular rate and rhythm without murmurs, gallops, rubs  Abdomen:     Soft, non-tender, nondistended, normal bowel sounds, no organomegaly  Genitalia:    deferred  Rectal:    deferred  Extremities:   No clubbing, cyanosis or edema. Fistula has palpable thrill, non-tender, no erythema or signs of infection.  Pulses:   2+ and symmetric all extremities  Skin:   Skin color, texture, turgor normal, no rashes or lesions, sacral decubitus dressing is C/D/I, very mild surrounding erythema, does not appear grossly infected.  Lymph nodes:   Cervical, supraclavicular, and  axillary nodes normal  Neurologic:   CNII-XII intact. Normal strength, sensation and reflexes      throughout    Labs on Admission:  Basic Metabolic Panel:  Recent Labs Lab 11/04/13 2235 11/04/13 2328 11/05/13 0008  NA 140  --  137  K 3.6*  --  3.3*  CL 97  --  102  CO2 21  --   --   GLUCOSE 100*  --  89  BUN 25*  --  23  CREATININE 3.67*  --  3.90*  CALCIUM 8.3*  --   --  MG  --  1.8  --   PHOS  --  3.4  --    Liver Function Tests:  Recent Labs Lab 11/04/13 2235  AST 14  ALT 12  ALKPHOS 76  BILITOT 0.7  PROT 5.5*  ALBUMIN 2.0*   No results found for this basename: LIPASE, AMYLASE,  in the last 168 hours No results found for this basename: AMMONIA,  in the last 168 hours CBC:  Recent Labs Lab 11/04/13 2235 11/05/13 0008  WBC 13.7*  --   NEUTROABS 11.8*  --   HGB 11.2* 13.3  HCT 36.6* 39.0  MCV 86.5  --   PLT 139*  --    Cardiac Enzymes:  Recent Labs Lab 11/04/13 2236  TROPONINI <0.30    BNP (last 3 results)  Recent Labs  06/08/13 2145 11/04/13 2328  PROBNP 16467.0* 13759.0*   CBG: No results found for this basename: GLUCAP,  in the last 168 hours  Radiological Exams on Admission: Dg Chest Portable 1 View  11/04/2013   CLINICAL DATA:  Chest pain  EXAM: PORTABLE CHEST - 1 VIEW  COMPARISON:  10/16/2013  FINDINGS: Dual-chamber pacer leads from the left are in unremarkable position. There is a right IJ dialysis catheter, tip at the upper right atrium.  Normal heart size and negative upper mediastinal contours. There is a trace left pleural effusion with overlying bandlike opacity suggesting atelectasis. The right chest is clear. No pneumothorax.  IMPRESSION: Small left pleural effusion with atelectasis, decreased from 10/16/2013.   Electronically Signed   By: Jorje Guild M.D.   On: 11/04/2013 23:59    EKG: Independently reviewed.  Assessment/Plan Principal Problem:   Severe sepsis Active Problems:   Multiple myeloma   End stage renal  disease   Hypotension    Severe sepsis causing hypotension - vanc and zosyn empirically for now, BCx pending, line sepsis is a strong possibility, graft giving no indication of infection, sacral decubitus is possible but does not appear grossly infected, no PNA on CXR nor cough at this time to suggest PNA as the source, no headache nor abdominal pain to suggest another location of a source at this time.  IVF at 50 cc/hr for now, bolus PRN for hypotension.  ESRD - next dialysis due on Monday, contact nephrology for routine consult then.  Multiple myeloma - continue dexamethasone Q tuesdays.    Code Status: Full Code  Family Communication: Wife at bedside Disposition Plan: Admit to inpatient   Time spent: 74 min  Daschel Roughton M. [facility] Pager [phone]  If 7AM-7PM, please contact the day team taking care of the patient [email or web address] Password [facility] 11/05/2013, 12:54 AM

## 2013-11-05 NOTE — Progress Notes (Signed)
Patient admitted after midnight by Dr. Alcario Drought.  Please see H&P.  Patient due for dialysis on Monday- renal notified. A/P Severe sepsis causing hypotension -  -vanc and zosyn empirically for now -BCx pending -line sepsis is a strong possibility, graft giving no indication of infection, sacral decubitus is possible but does not appear grossly infected, no PNA on CXR nor cough at this time to suggest PNA as the source, no headache nor abdominal pain to suggest another location of a source at this time.   ESRD - next dialysis due on Monday  Multiple myeloma - continue dexamethasone Q tuesdays.    Scott Bear DO

## 2013-11-05 NOTE — ED Notes (Signed)
Pt alert and speaking in full sentences- no distress noted at this time.

## 2013-11-05 NOTE — Consult Note (Signed)
Indication for Consultation:  Management of ESRD/hemodialysis; anemia, hypertension/volume and secondary hyperparathyroidism  HPI: Scott [name] is a 73 y.o. male who presented to the ED last night with complaints of chills and sob. He receives HD MWF in [place], history of DM and multiple myeloma. He reports he was feeling well until Friday after HD he was very fatigued.  He began experiencing chills yesterday and sob that was not relived by albuterol so he came to the ED. He has been following with the wound care center weekly for a sacral decub, wife does daily dressing changes. Will arrange for HD tomorrow.  Past Medical History  Diagnosis Date  . Renal disorder   . Pacemaker   . Shortness of breath     "when I take one of my chemo pills" (03/17/2013)  . Protein calorie malnutrition   . Multiple myeloma   . Skin cancer of face     "once; left side of my face" (03/17/2013)  . Pneumonia 06/09/2013  . Diabetes mellitus without complication     no meds  . Arthritis     "little in my right shoulder" (03/17/2013)  . History of blood transfusion   . Daily headache     denies  . Cataract    Past Surgical History  Procedure Laterality Date  . Appendectomy    . Kidney surgery Right 2000's    "cut out ~ 1/2"   . Insert / replace / remove pacemaker    . Bascilic vein transposition Right 07/10/2013    Procedure: BASILIC VEIN TRANSPOSITION- FIRST STAGE ;  Surgeon: [name], MD;  Location: [facility];  Service: Vascular;  Laterality: Right;  . Insertion of dialysis catheter Right 08/28/2013    Procedure: INSERTION OF DIALYSIS CATHETER in Right Internal Jugular;  Surgeon: [name], MD;  Location: [facility];  Service: Vascular;  Laterality: Right;  . Bascilic vein transposition Right 09/01/2013    Procedure: BASILIC VEIN TRANSPOSITION - 2ND STAGE AVF CREATION;  Surgeon: [name], MD;  Location: [facility];  Service: Vascular;  Laterality: Right;  . Av fistula placement     Family History  Problem  Relation Age of Onset  . Diabetes Mellitus II Father    Social History:  reports that he has been smoking Cigarettes.  He has a 15 pack-year smoking history. He has never used smokeless tobacco. He reports that he does not drink alcohol or use illicit drugs. No Known Allergies Prior to Admission medications   Medication Sig Start Date End Date Taking? Authorizing Provider  ACCU-CHEK AVIVA PLUS test strip 1 each by Other route See admin instructions. Check blood sugar once daily. 05/31/13  Yes Historical Provider, MD  ACCU-CHEK SOFTCLIX LANCETS lancets 1 each by Other route See admin instructions. Check blood sugar once daily. 05/31/13  Yes Historical Provider, MD  acyclovir (ZOVIRAX) 400 MG tablet Take 400 mg by mouth daily.   Yes Historical Provider, MD  amiodarone (PACERONE) 200 MG tablet Take 200 mg by mouth daily.   Yes Historical Provider, MD  dexamethasone (DECADRON) 4 MG tablet Take 40 mg by mouth every 7 (seven) days. Every Tuesday.   Yes Historical Provider, MD  ferrous sulfate 325 (65 FE) MG tablet Take 325 mg by mouth daily with breakfast.   Yes Historical Provider, MD  ibuprofen (ADVIL,MOTRIN) 200 MG tablet Take 200 mg by mouth every 6 (six) hours as needed.   Yes Historical Provider, MD  ipratropium-albuterol (DUONEB) 0.5-2.5 (3) MG/3ML SOLN Take  3 mLs by nebulization every 4 (four) hours as needed (for shortness of breath).   Yes Historical Provider, MD  megestrol (MEGACE) 40 MG/ML suspension Take 800 mg by mouth daily.   Yes Historical Provider, MD  Menthol-Methyl Salicylate (MUSCLE RUB EX) Apply 1 application topically as needed. For muscle cramps 07/24/13  Yes Historical Provider, MD  midodrine (PROAMATINE) 10 MG tablet Take 1 tablet (10 mg total) by mouth 3 (three) times daily with meals. 09/07/13  Yes Reyne Dumas, MD  traMADol (ULTRAM) 50 MG tablet Take 1 tablet (50 mg total) by mouth every 6 (six) hours as needed for moderate pain. 09/07/13  Yes Reyne Dumas, MD   Current  Facility-Administered Medications  Medication Dose Route Frequency Provider Last Rate Last Dose  . acyclovir (ZOVIRAX) 200 MG capsule 400 mg  400 mg Oral Daily Etta Quill, DO   400 mg at 11/05/13 1037  . amiodarone (PACERONE) tablet 200 mg  200 mg Oral Daily Etta Quill, DO   200 mg at 11/05/13 1022  . Chlorhexidine Gluconate Cloth 2 % PADS 6 each  6 each Topical Q0600 Etta Quill, DO      . [START ON 11/07/2013] dexamethasone (DECADRON) tablet 40 mg  40 mg Oral Q7 days Etta Quill, DO      . ferrous sulfate tablet 325 mg  325 mg Oral Q breakfast Etta Quill, DO   325 mg at 11/05/13 1022  . heparin injection 5,000 Units  5,000 Units Subcutaneous 3 times per day Etta Quill, DO   5,000 Units at 11/05/13 0500  . ibuprofen (ADVIL,MOTRIN) tablet 200 mg  200 mg Oral Q6H PRN Etta Quill, DO      . ipratropium-albuterol (DUONEB) 0.5-2.5 (3) MG/3ML nebulizer solution 3 mL  3 mL Nebulization Q4H PRN Etta Quill, DO      . megestrol (MEGACE) 40 MG/ML suspension 800 mg  800 mg Oral Daily Etta Quill, DO   800 mg at 11/05/13 1020  . midodrine (PROAMATINE) tablet 10 mg  10 mg Oral TID WC Etta Quill, DO   10 mg at 11/05/13 1022  . mupirocin ointment (BACTROBAN) 2 % 1 application  1 application Nasal BID Etta Quill, DO   1 application at 92/42/68 1020  . piperacillin-tazobactam (ZOSYN) IVPB 2.25 g  2.25 g Intravenous 3 times per day Etta Quill, DO   2.25 g at 11/05/13 0500  . sodium chloride 0.9 % injection 3 mL  3 mL Intravenous Q12H Etta Quill, DO   3 mL at 11/05/13 0301  . traMADol (ULTRAM) tablet 50 mg  50 mg Oral Q12H PRN Etta Quill, DO      . [START ON 11/06/2013] vancomycin (VANCOCIN) IVPB 750 mg/150 ml premix  750 mg Intravenous Q M,W,F-HD Etta Quill, DO       Labs: Basic Metabolic Panel:  Recent Labs Lab 11/04/13 2235 11/04/13 2328 11/05/13 0008 11/05/13 0418  NA 140  --  137 140  K 3.6*  --  3.3* 3.7  CL 97  --  102 102  CO2 21   --   --  24  GLUCOSE 100*  --  89 72  BUN 25*  --  23 28*  CREATININE 3.67*  --  3.90* 3.91*  CALCIUM 8.3*  --   --  7.8*  PHOS  --  3.4  --   --    Liver Function Tests:  Recent Labs Lab  11/04/13 2235  AST 14  ALT 12  ALKPHOS 76  BILITOT 0.7  PROT 5.5*  ALBUMIN 2.0*   No results found for this basename: LIPASE, AMYLASE,  in the last 168 hours No results found for this basename: AMMONIA,  in the last 168 hours CBC:  Recent Labs Lab 11/04/13 2235 11/05/13 0008 11/05/13 0418  WBC 13.7*  --  14.5*  NEUTROABS 11.8*  --   --   HGB 11.2* 13.3 9.9*  HCT 36.6* 39.0 31.4*  MCV 86.5  --  86.7  PLT 139*  --  PLATELET CLUMPS NOTED ON SMEAR, COUNT APPEARS DECREASED   Cardiac Enzymes:  Recent Labs Lab 11/04/13 2236  TROPONINI <0.30   CBG: No results found for this basename: GLUCAP,  in the last 168 hours Iron Studies: No results found for this basename: IRON, TIBC, TRANSFERRIN, FERRITIN,  in the last 72 hours Studies/Results: Dg Chest Portable 1 View  11/04/2013   CLINICAL DATA:  Chest pain  EXAM: PORTABLE CHEST - 1 VIEW  COMPARISON:  10/16/2013  FINDINGS: Dual-chamber pacer leads from the left are in unremarkable position. There is a right IJ dialysis catheter, tip at the upper right atrium.  Normal heart size and negative upper mediastinal contours. There is a trace left pleural effusion with overlying bandlike opacity suggesting atelectasis. The right chest is clear. No pneumothorax.  IMPRESSION: Small left pleural effusion with atelectasis, decreased from 10/16/2013.   Electronically Signed   By: Jorje Guild M.D.   On: 11/04/2013 23:59   Review of Systems: Gen: Reports chills, fatigue. Denies anorexia, weakness, malaise, weight loss, and sleep disorder HEENT: No visual complaints, No history of Retinopathy. Normal external appearance No Epistaxis or Sore throat. No sinusitis.   CV: Denies chest pain, angina, palpitations, syncope, orthopnea, PND, peripheral edema, and  claudication. Resp: Reports intermittent dyspnea at rest, dyspnea with exercise, cough, sputum.  Denies wheezing, coughing up blood, and pleurisy. GI: Denies vomiting blood, jaundice, and fecal incontinence.   Denies dysphagia or odynophagia. GU : Reports urinary burning. Denies blood in urine, urinary frequency, urinary hesitancy, nocturnal urination, and urinary incontinence.  No renal calculi. MS: Denies joint pain, limitation of movement, and swelling, stiffness, low back pain, extremity pain. Denies muscle weakness, cramps, atrophy.  No use of non steroidal antiinflammatory drugs. Derm: Denies rash, itching, dry skin, hives, moles, warts. Positive for sacral decub and left arm bruising Psych: Denies depression, anxiety, memory loss, suicidal ideation, hallucinations, paranoia, and confusion. Heme: Denies bruising, bleeding, and enlarged lymph nodes. Neuro: No headache.  No diplopia. No dysarthria.  No dysphasia.  No history of CVA.  No Seizures. No paresthesias.  No weakness. Endocrine   No Thyroid disease.  No Adrenal disease.  Physical Exam: Filed Vitals:   11/05/13 0245 11/05/13 0300 11/05/13 0400 11/05/13 0817  BP: 92/53 87/53 113/44 147/50  Pulse: 64 105 78 68  Temp:    98 F (36.7 C)  TempSrc:    Oral  Resp: 17 20 17 17   Height:      Weight:      SpO2: 99% 98% 96% 97%     General: Well developed, well nourished, in no acute distress. Head: Normocephalic, atraumatic, sclera non-icteric, mucus membranes are moist Neck: Supple. JVD not elevated. RIJ TDC without purulent drainage from exit site. Lungs: Clear w dim bases..Breathing is unlabored. Heart: RRR with S1 S2. No murmurs, rubs, or gallops appreciated. Abdomen: Soft, non-tender, non-distended with normoactive bowel sounds. No rebound/guarding. No obvious abdominal masses.  M-S:  Strength and tone appear normal for age. Lower extremities: +3 bilat LE edema Skin: sacral decub, OTA, no drainage. Mild erythema but no signs of  gross infection Neuro: Alert and oriented X 3. Moves all extremities spontaneously. Psych:  Responds to questions appropriately with a normal affect. Dialysis Access: R AVF maturing +bruit/thrill. R IJ cath  Dialysis Orders:  MWF [facility] . 63.5kgs  3K/2.25Ca 4hrs 2000 Heparin R IJ 400/1.5  No Vit D  Aranesp 160 q week  Venofer 50 q week  Assessment/Plan: 1.  sepsis- management per primary. vanc and zosyn per pharm. WBC 14.5 afebrile. Blood cultures pending. UA pending 2.  ESRD -  MWF [facility] [place], HD pending tomorrow. K+3.7 3.  Hypertension/volume  - 147/50. Midodrine. 6.5kgs over edw. +edema. EDW has been lowered outpt, cont to challenge.  Runs full tx but only about 1.2 removed and getting to edw. May need xtra tx here for volume. Improved left pleural effusion on xray (from 8/3).  4.  Anemia  - hgb 9.9. Cont ESA and FE, last tsat 26 (7/22) 5.  Metabolic bone disease -  Ca+ 7.8. Phos 3.4 Last PTH 211 (7/22) No Vit D, no binders 6.  Nutrition - Alb 2. renal diet. Megace. multivit 7. Multple myelome- dexamathasone q Tuesday 8. Perm access- R BVT ([address] 4/27, [address] St 6/19), may use in Sept per Dr. Vernie Ammons, NP [facility] [phone] 11/05/2013, 11:08 AM       I have seen and examined this patient and agree with plan as outlined by B. Orvil Feil, NP.  Pt with SIRS without clear etiology.  Possibly related to [facility] but no pururlent drainage or tenderness at exit site.Marland Kitchen Damari Hiltz A,MD 11/05/2013 1:54 PM

## 2013-11-05 NOTE — Progress Notes (Signed)
2nd attempt to assist with bath. Pt stated he will get wife to give him a bath.

## 2013-11-06 DIAGNOSIS — J189 Pneumonia, unspecified organism: Secondary | ICD-10-CM

## 2013-11-06 DIAGNOSIS — J9600 Acute respiratory failure, unspecified whether with hypoxia or hypercapnia: Secondary | ICD-10-CM

## 2013-11-06 LAB — BASIC METABOLIC PANEL
Anion gap: 15 (ref 5–15)
BUN: 40 mg/dL — ABNORMAL HIGH (ref 6–23)
CALCIUM: 7.8 mg/dL — AB (ref 8.4–10.5)
CO2: 23 mEq/L (ref 19–32)
CREATININE: 4.81 mg/dL — AB (ref 0.50–1.35)
Chloride: 101 mEq/L (ref 96–112)
GFR calc Af Amer: 13 mL/min — ABNORMAL LOW (ref >90)
GFR, EST NON AFRICAN AMERICAN: 11 mL/min — AB (ref >90)
GLUCOSE: 66 mg/dL — AB (ref 70–99)
Potassium: 3.7 mEq/L (ref 3.7–5.3)
SODIUM: 139 meq/L (ref 137–147)

## 2013-11-06 LAB — CBC
HCT: 30.4 % — ABNORMAL LOW (ref 39.0–52.0)
HEMOGLOBIN: 9.8 g/dL — AB (ref 13.0–17.0)
MCH: 27.5 pg (ref 26.0–34.0)
MCHC: 32.2 g/dL (ref 30.0–36.0)
MCV: 85.2 fL (ref 78.0–100.0)
Platelets: 104 10*3/uL — ABNORMAL LOW (ref 150–400)
RBC: 3.57 MIL/uL — ABNORMAL LOW (ref 4.22–5.81)
RDW: 20.1 % — ABNORMAL HIGH (ref 11.5–15.5)
WBC: 12 10*3/uL — ABNORMAL HIGH (ref 4.0–10.5)

## 2013-11-06 LAB — GLUCOSE, CAPILLARY: Glucose-Capillary: 77 mg/dL (ref 70–99)

## 2013-11-06 MED ORDER — LIDOCAINE HCL (PF) 1 % IJ SOLN: 5.0000 mL | INTRAMUSCULAR | Status: DC | PRN

## 2013-11-06 MED ORDER — HEPARIN SODIUM (PORCINE) 1000 UNIT/ML DIALYSIS: 1000.0000 [IU] | INTRAMUSCULAR | Status: DC | PRN

## 2013-11-06 MED ORDER — SODIUM CHLORIDE 0.9 % IV SOLN: 100.0000 mL | INTRAVENOUS | Status: DC | PRN

## 2013-11-06 MED ORDER — PENTAFLUOROPROP-TETRAFLUOROETH EX AERO: 1.0000 "application " | INHALATION_SPRAY | CUTANEOUS | Status: DC | PRN

## 2013-11-06 MED ORDER — NEPRO/CARBSTEADY PO LIQD
237.0000 mL | ORAL | Status: DC | PRN
  Filled 2013-11-06: qty 237

## 2013-11-06 MED ORDER — LIDOCAINE-PRILOCAINE 2.5-2.5 % EX CREA
1.0000 "application " | TOPICAL_CREAM | CUTANEOUS | Status: DC | PRN
  Filled 2013-11-06: qty 5

## 2013-11-06 MED ORDER — IPRATROPIUM-ALBUTEROL 0.5-2.5 (3) MG/3ML IN SOLN
3.0000 mL | Freq: Three times a day (TID) | RESPIRATORY_TRACT | Status: DC
  Administered 2013-11-07 – 2013-11-08 (×4): 3 mL via RESPIRATORY_TRACT
  Filled 2013-11-06 (×4): qty 3

## 2013-11-06 MED ORDER — IPRATROPIUM-ALBUTEROL 0.5-2.5 (3) MG/3ML IN SOLN: 3.0000 mL | Freq: Four times a day (QID) | RESPIRATORY_TRACT | Status: DC | PRN

## 2013-11-06 MED ORDER — IPRATROPIUM-ALBUTEROL 0.5-2.5 (3) MG/3ML IN SOLN
3.0000 mL | Freq: Four times a day (QID) | RESPIRATORY_TRACT | Status: DC
  Administered 2013-11-06 (×2): 3 mL via RESPIRATORY_TRACT
  Filled 2013-11-06 (×2): qty 3

## 2013-11-06 MED ORDER — ALTEPLASE 2 MG IJ SOLR
2.0000 mg | Freq: Once | INTRAMUSCULAR | Status: DC | PRN
  Filled 2013-11-06: qty 2

## 2013-11-06 MED ORDER — HEPARIN SODIUM (PORCINE) 1000 UNIT/ML DIALYSIS: 2000.0000 [IU] | Freq: Once | INTRAMUSCULAR | Status: DC

## 2013-11-06 MED ORDER — HEPARIN SODIUM (PORCINE) 1000 UNIT/ML DIALYSIS: 20.0000 [IU]/kg | INTRAMUSCULAR | Status: DC | PRN

## 2013-11-06 NOTE — Progress Notes (Signed)
Subjective:  Seen on dialysis, no complaints, feeling better  Objective: Vital signs in last 24 hours: Temp:  [96.9 F (36.1 C)-98.3 F (36.8 C)] 97.9 F (36.6 C) (08/24 0823) Pulse Rate:  [59-83] 68 (08/24 0930) Resp:  [14-26] 14 (08/24 0930) BP: (80-149)/(34-93) 102/34 mmHg (08/24 0930) SpO2:  [95 %-100 %] 96 % (08/24 0823) Weight:  [63.2 kg (139 lb 5.3 oz)-64.4 kg (141 lb 15.6 oz)] 63.2 kg (139 lb 5.3 oz) (08/24 0823) Weight change: -5.6 kg (-12 lb 5.5 oz)  Intake/Output from previous day: 08/23 0701 - 08/24 0700 In: 876 [P.O.:720; I.V.:6; IV Piggyback:150] Out: 350 [Urine:350]   Lab Results:  Recent Labs  11/05/13 0418 11/06/13 0327  WBC 14.5* 12.0*  HGB 9.9* 9.8*  HCT 31.4* 30.4*  PLT PLATELET CLUMPS NOTED ON SMEAR, COUNT APPEARS DECREASED 104*   BMET:  Recent Labs  11/04/13 2235  11/05/13 0418 11/06/13 0327  NA 140  < > 140 139  K 3.6*  < > 3.7 3.7  CL 97  < > 102 101  CO2 21  --  24 23  GLUCOSE 100*  < > 72 66*  BUN 25*  < > 28* 40*  CREATININE 3.67*  < > 3.91* 4.81*  CALCIUM 8.3*  --  7.8* 7.8*  ALBUMIN 2.0*  --   --   --   < > = values in this interval not displayed. No results found for this basename: PTH,  in the last 72 hours Iron Studies: No results found for this basename: IRON, TIBC, TRANSFERRIN, FERRITIN,  in the last 72 hours  Studies/Results: No results found.  EXAM: General appearance:  Alert, in no apparent distress Resp:  Decreased BS @ bases; otherwise, clear Cardio:  RRR without murmur or rub GI:  + BS, soft and nontender Extremities: No edema Access:  R IJ catheter with BFR 400 cc/min; maturing AVF @ RUA with + bruit  Dialysis Orders: MWF [place] .  63.5kgs 3K/2.25Ca 4hrs 2000 Heparin R IJ 400/1.5  No Vit D Aranesp 160 q week Venofer 50 q week  Assessment/Plan: 1. Fever/weakness - ? Sepsis with BCs pending (with R IJ catheter), UA + with culture pending, sacral ulcer less likely source; on Vancomycin & Zosyn. 2. ESRD - HD on  MWF @ [facility],  3. HTN/Volume - BP 102/34 on Midodrine 10 mg tid, worsened by infection; @ EDW, not tolerating UF goal of 3 L. 4. Anemia - Hgb 9.8, Aranesp 160 mcg on Wed, weekly Fe. 5. 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 6. Nutrition - renal diet, on Megace, multivitamin. 7. Dialysis access - R BVT (1st St 4/27, 2nd St 6/19), may use in Sept per Dr. Donnetta Hutching.   LOS: 2 days   LYLES,CHARLES 11/06/2013,9:43 AM  Pt seen, examined and agree w A/P as above.  Kelly Splinter MD pager [phone]    cell [phone] 11/06/2013, 2:56 PM

## 2013-11-06 NOTE — Progress Notes (Addendum)
PROGRESS NOTE  Scott [name] MIW:[id number] DOB: October 27, 1940 DOA: 11/04/2013 PCP: Bufford Buttner, MD  Assessment/Plan: Severe sepsis causing hypotension -  -vanc and zosyn empirically for now  -BCx pending  -line sepsis is a strong possibility, graft giving no indication of infection, sacral decubitus is possible but does not appear grossly infected -patient now coughing up greenish sputum- so suspect PNA as source- he also still smokes -nebs  ESRD - dialysis due on Monday   Multiple myeloma - continue dexamethasone Q tuesdays  Thrombocytopenia- watch -may need to change zosyn  Tobacco abuse  Leukocytosis -improving -trend   Code Status: full Family Communication: patient and wife Disposition Plan: tx to floor after dialysis if BP remains stable   Consultants:  neprho  Procedures:      HPI/Subjective: +cough with sputum production   Objective: Filed Vitals:   11/06/13 0433  BP: 149/93  Pulse: 64  Temp: 98 F (36.7 C)  Resp: 17    Intake/Output Summary (Last 24 hours) at 11/06/13 0756 Last data filed at 11/06/13 0515  Gross per 24 hour  Intake    876 ml  Output    350 ml  Net    526 ml   Filed Weights   11/05/13 0025 11/06/13 0433  Weight: 70 kg (154 lb 5.2 oz) 64.4 kg (141 lb 15.6 oz)    Exam:   General:  A+Ox3, NAD  Cardiovascular: rrr  Respiratory: clear  Abdomen: +BS, soft  Musculoskeletal: moves all 4 ext   Data Reviewed: Basic Metabolic Panel:  Recent Labs Lab 11/04/13 2235 11/04/13 2328 11/05/13 0008 11/05/13 0418 11/06/13 0327  NA 140  --  137 140 139  K 3.6*  --  3.3* 3.7 3.7  CL 97  --  102 102 101  CO2 21  --   --  24 23  GLUCOSE 100*  --  89 72 66*  BUN 25*  --  23 28* 40*  CREATININE 3.67*  --  3.90* 3.91* 4.81*  CALCIUM 8.3*  --   --  7.8* 7.8*  MG  --  1.8  --   --   --   PHOS  --  3.4  --   --   --    Liver Function Tests:  Recent Labs Lab 11/04/13 2235  AST 14  ALT 12  ALKPHOS 76  BILITOT  0.7  PROT 5.5*  ALBUMIN 2.0*   No results found for this basename: LIPASE, AMYLASE,  in the last 168 hours No results found for this basename: AMMONIA,  in the last 168 hours CBC:  Recent Labs Lab 11/04/13 2235 11/05/13 0008 11/05/13 0418 11/06/13 0327  WBC 13.7*  --  14.5* 12.0*  NEUTROABS 11.8*  --   --   --   HGB 11.2* 13.3 9.9* 9.8*  HCT 36.6* 39.0 31.4* 30.4*  MCV 86.5  --  86.7 85.2  PLT 139*  --  PLATELET CLUMPS NOTED ON SMEAR, COUNT APPEARS DECREASED 104*   Cardiac Enzymes:  Recent Labs Lab 11/04/13 2236  TROPONINI <0.30   BNP (last 3 results)  Recent Labs  06/08/13 2145 11/04/13 2328  PROBNP 16467.0* 13759.0*   CBG:  Recent Labs Lab 11/06/13 0651  GLUCAP 77    Recent Results (from the past 240 hour(s))  CULTURE, BLOOD (ROUTINE X 2)     Status: None   Collection Time    11/04/13 11:28 PM      Result Value Ref Range Status   Specimen Description BLOOD  LEFT ANTECUBITAL   Final   Special Requests BOTTLES DRAWN AEROBIC AND ANAEROBIC 6CC EA   Final   Culture  Setup Time     Final   Value: 11/05/2013 13:51     Performed at [facility]   Culture     Final   Value:        BLOOD CULTURE RECEIVED NO GROWTH TO DATE CULTURE WILL BE HELD FOR 5 DAYS BEFORE ISSUING A FINAL NEGATIVE REPORT     Performed at [facility]   Report Status PENDING   Incomplete  CULTURE, BLOOD (ROUTINE X 2)     Status: None   Collection Time    11/04/13 11:32 PM      Result Value Ref Range Status   Specimen Description BLOOD LEFT HAND   Final   Special Requests BOTTLES DRAWN AEROBIC ONLY 2CC   Final   Culture  Setup Time     Final   Value: 11/05/2013 13:51     Performed at [facility]   Culture     Final   Value:        BLOOD CULTURE RECEIVED NO GROWTH TO DATE CULTURE WILL BE HELD FOR 5 DAYS BEFORE ISSUING A FINAL NEGATIVE REPORT     Performed at [facility]   Report Status PENDING   Incomplete  MRSA PCR SCREENING     Status: Abnormal    Collection Time    11/05/13  4:01 AM      Result Value Ref Range Status   MRSA by PCR POSITIVE (*) NEGATIVE Final   Comment:            The GeneXpert MRSA Assay (FDA     approved for NASAL specimens     only), is one component of a     comprehensive MRSA colonization     surveillance program. It is not     intended to diagnose MRSA     infection nor to guide or     monitor treatment for     MRSA infections.     RESULT CALLED TO, READ BACK BY AND VERIFIED WITH:     DUFFY,S RN [id number] 11/05/13 MITCHELL,L     Studies: Dg Chest Portable 1 View  11/04/2013   CLINICAL DATA:  Chest pain  EXAM: PORTABLE CHEST - 1 VIEW  COMPARISON:  10/16/2013  FINDINGS: Dual-chamber pacer leads from the left are in unremarkable position. There is a right IJ dialysis catheter, tip at the upper right atrium.  Normal heart size and negative upper mediastinal contours. There is a trace left pleural effusion with overlying bandlike opacity suggesting atelectasis. The right chest is clear. No pneumothorax.  IMPRESSION: Small left pleural effusion with atelectasis, decreased from 10/16/2013.   Electronically Signed   By: Jorje Guild M.D.   On: 11/04/2013 23:59    Scheduled Meds: . acyclovir  400 mg Oral Daily  . amiodarone  200 mg Oral Daily  . Chlorhexidine Gluconate Cloth  6 each Topical Q0600  . [START ON 11/08/2013] darbepoetin (ARANESP) injection - DIALYSIS  160 mcg Intravenous Q Wed-HD  . [START ON 11/07/2013] dexamethasone  40 mg Oral Q7 days  . [START ON 11/08/2013] ferric gluconate (FERRLECIT/NULECIT) IV  62.5 mg Intravenous Weekly  . ferrous sulfate  325 mg Oral Q breakfast  . heparin  5,000 Units Subcutaneous 3 times per day  . megestrol  800 mg Oral Daily  . midodrine  10 mg Oral TID WC  .  multivitamin  1 tablet Oral QHS  . mupirocin ointment  1 application Nasal BID  . piperacillin-tazobactam (ZOSYN)  IV  2.25 g Intravenous 3 times per day  . sodium chloride  3 mL Intravenous Q12H  . vancomycin  750  mg Intravenous Q M,W,F-HD   Continuous Infusions:  Antibiotics Given (last 72 hours)   Date/Time Action Medication Dose Rate   11/05/13 0452 Given   vancomycin (VANCOCIN) 500 mg in sodium chloride 0.9 % 100 mL IVPB 500 mg 100 mL/hr   11/05/13 0500 Given   piperacillin-tazobactam (ZOSYN) IVPB 2.25 g 2.25 g 100 mL/hr   11/05/13 1037 Given   acyclovir (ZOVIRAX) 200 MG capsule 400 mg 400 mg    11/05/13 1313 Given   piperacillin-tazobactam (ZOSYN) IVPB 2.25 g 2.25 g 100 mL/hr   11/05/13 2139 Given   piperacillin-tazobactam (ZOSYN) IVPB 2.25 g 2.25 g 100 mL/hr   11/06/13 0515 Given   piperacillin-tazobactam (ZOSYN) IVPB 2.25 g 2.25 g 100 mL/hr      Principal Problem:   Severe sepsis Active Problems:   Multiple myeloma   End stage renal disease   Hypotension    Time spent: 35 min    VANN, [facility] Pager [phone]. If 7PM-7AM, please contact night-coverage at [email or web address], password [facility] 11/06/2013, 7:56 AM  LOS: 2 days

## 2013-11-06 NOTE — Clinical Documentation Improvement (Signed)
"  Sacral Decubitus which is followed by wound care clinic" is documented in H&P.  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  If you agree with the [place] Nurse assessment, please document the Stage of the Sacral Decubitus and also the presence and stage of the left buttock pressure ulcer in the progress notes and discharge summary.    Thank You, Erling Conte ,RN Clinical Documentation Specialist:  [phone] [place] Information Management

## 2013-11-06 NOTE — Consult Note (Signed)
WOC wound consult note Reason for Consult: evaluation for sacral pressure ulcer.  Pt with history of PU since hospital admission in January 2014.  Followed by the [facility] wound care center.  Wife and HHRN have been changing dressings at home.  She reports they were planning to start NPWT dressing this week at home.  Wife reports new pressure redistribution cushion obtained for his WC at home and new air mattress in the home on his hospital bed.  Wound type:  Stage IV pressure ulcer on the sacrum; Stage III pressure ulcer left buttock  Pressure Ulcer POA: Yes x 2 Measurement: sacrum: 1.5cm x 1.0cm x 1.0 with 1.5cm tunnel at 7 o'clock;  Left buttock 1.5cm x 1.0 x 0.2cm  Wound bed: sacrum: bone palpable, clean, pale moist wound base;  Left buttock: clean, pink with epithelial buds over the wound bed Drainage (amount, consistency, odor) minimal, serosanguinous at both sites.  A little thicker from the sacrum, but no odor. Periwound: intact with blanchable redness at the sacrum.  Dressing procedure/placement/frequency: Continue saline gauze packing BID. The opening of the ulcer is not very large and with the size of this I am concerned the NPWT foam dressing may cause more damage to the surrounding skin than benefit.  I am not sure of the wound bed interface planned at home but based on the wife report of the DME provider it may be one that has a gauze interface which would be more optimal for this patient.  I have added an air mattress for pressure redistribution.   Discussed POC with patient and bedside nurse.  Re consult if needed, will not follow at this time. Thanks  Quantina Dershem Kellogg, [place] [phone])

## 2013-11-07 DIAGNOSIS — C9000 Multiple myeloma not having achieved remission: Secondary | ICD-10-CM

## 2013-11-07 DIAGNOSIS — L8994 Pressure ulcer of unspecified site, stage 4: Secondary | ICD-10-CM

## 2013-11-07 DIAGNOSIS — L89109 Pressure ulcer of unspecified part of back, unspecified stage: Secondary | ICD-10-CM

## 2013-11-07 DIAGNOSIS — L89154 Pressure ulcer of sacral region, stage 4: Secondary | ICD-10-CM | POA: Diagnosis present

## 2013-11-07 LAB — BASIC METABOLIC PANEL
Anion gap: 12 (ref 5–15)
BUN: 15 mg/dL (ref 6–23)
CHLORIDE: 99 meq/L (ref 96–112)
CO2: 27 meq/L (ref 19–32)
Calcium: 7.5 mg/dL — ABNORMAL LOW (ref 8.4–10.5)
Creatinine, Ser: 2.65 mg/dL — ABNORMAL HIGH (ref 0.50–1.35)
GFR calc Af Amer: 26 mL/min — ABNORMAL LOW (ref >90)
GFR calc non Af Amer: 23 mL/min — ABNORMAL LOW (ref >90)
Glucose, Bld: 59 mg/dL — ABNORMAL LOW (ref 70–99)
Potassium: 3.5 mEq/L — ABNORMAL LOW (ref 3.7–5.3)
Sodium: 138 mEq/L (ref 137–147)

## 2013-11-07 LAB — URINE CULTURE: Colony Count: 40000

## 2013-11-07 LAB — GLUCOSE, CAPILLARY
GLUCOSE-CAPILLARY: 228 mg/dL — AB (ref 70–99)
Glucose-Capillary: 55 mg/dL — ABNORMAL LOW (ref 70–99)
Glucose-Capillary: 63 mg/dL — ABNORMAL LOW (ref 70–99)

## 2013-11-07 LAB — CBC
HEMATOCRIT: 31.8 % — AB (ref 39.0–52.0)
HEMOGLOBIN: 10.1 g/dL — AB (ref 13.0–17.0)
MCH: 27.3 pg (ref 26.0–34.0)
MCHC: 31.8 g/dL (ref 30.0–36.0)
MCV: 85.9 fL (ref 78.0–100.0)
Platelets: 130 10*3/uL — ABNORMAL LOW (ref 150–400)
RBC: 3.7 MIL/uL — AB (ref 4.22–5.81)
RDW: 20.2 % — AB (ref 11.5–15.5)
WBC: 8.4 10*3/uL (ref 4.0–10.5)

## 2013-11-07 NOTE — Progress Notes (Signed)
Subjective:  Feeling better, more alert, breathing well after nebulizer treatment  Objective: Vital signs in last 24 hours: Temp:  [97.9 F (36.6 C)-98.3 F (36.8 C)] 98 F (36.7 C) (08/25 0647) Pulse Rate:  [64-82] 64 (08/25 0647) Resp:  [14-23] 18 (08/25 0647) BP: (80-132)/(34-72) 115/42 mmHg (08/25 0647) SpO2:  [94 %-100 %] 95 % (08/25 0647) Weight:  [61.199 kg (134 lb 14.7 oz)-63.2 kg (139 lb 5.3 oz)] 61.199 kg (134 lb 14.7 oz) (08/24 2319) Weight change: -1.2 kg (-2 lb 10.3 oz)  Intake/Output from previous day: 08/24 0701 - 08/25 0700 In: 0  Out: 1959 [Urine:200] Intake/Output this shift:   Lab Results:  Recent Labs  11/06/13 0327 11/07/13 0611  WBC 12.0* 8.4  HGB 9.8* 10.1*  HCT 30.4* 31.8*  PLT 104* 130*   BMET:  Recent Labs  11/04/13 2235  11/06/13 0327 11/07/13 0611  NA 140  < > 139 138  K 3.6*  < > 3.7 3.5*  CL 97  < > 101 99  CO2 21  < > 23 27  GLUCOSE 100*  < > 66* 59*  BUN 25*  < > 40* 15  CREATININE 3.67*  < > 4.81* 2.65*  CALCIUM 8.3*  < > 7.8* 7.5*  ALBUMIN 2.0*  --   --   --   < > = values in this interval not displayed. No results found for this basename: PTH,  in the last 72 hours Iron Studies: No results found for this basename: IRON, TIBC, TRANSFERRIN, FERRITIN,  in the last 72 hours  Studies/Results: No results found.  EXAM:  General appearance: Alert, in no apparent distress  Resp:  CTA without rales, rhonchi, or wheezes Cardio: RRR without murmur or rub  GI: + BS, soft and nontender  Extremities: No edema  Access: R IJ catheter with BFR 400 cc/min; maturing AVF @ RUA with + bruit   Dialysis Orders: MWF [place] .  63.5kgs 3K/2.25Ca 4hrs 2000 Heparin R IJ 400/1.5  No Vit D Aranesp 160 q week Venofer 50 q week  Assessment/Plan: 1. Fever/weakness - Improving, ? Sepsis, but BCs negative so far (with R IJ catheter), urine culture + for E coli, sacral ulcer less likely source; on Vancomycin & Zosyn.  2. ESRD - HD on MWF @ [facility], K  3.5.  Next HD tomorrow. 3. HTN/Volume - BP 115/42 on Midodrine 10 mg tid, worsened by infection; wt 61.2 kg s/p net UF 1.8 L yesterday, did not tolerate well.  4. Anemia - Hgb 10.1, Aranesp 160 mcg on Wed, weekly Fe.  5. Sec HPT - Ca 7.5; no Hectorol or binders.  6. Nutrition - renal diet, on Megace, multivitamin.  7. Dialysis access - R BVT ([address] 4/27, [address] St 6/19), may use in Sept per Dr. Donnetta Hutching.    LOS: 3 days   LYLES,CHARLES 11/07/2013,7:50 AM   I have seen and examined this patient and agree with plan as outlined by C. Lyles, PA-C. Channing Savich A,MD 11/07/2013 1:17 PM

## 2013-11-07 NOTE — Evaluation (Signed)
Physical Therapy Evaluation Patient Details Name: Scott [name] MRN: [id number] DOB: 1940-08-31 Today's Date: 11/07/2013   History of Present Illness  Scott [name] is a 73 y.o. 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  Denies cough but does have intermittent SOB.  No chest pain.  Was dialized on Friday (next scheduled dialysis is Monday), had full run of dialysis on Friday.  Dialysis access is via a temporary vas cath in his RIJ which is being used while a R brachial fistula matures.  He began to feel poorly today at home.  He does have some lower back pain but other than that denies pain anywhere else.  Does have a sacral decubitus ulcer which is followed by wound care clinic.  Wife has been changing dressing and has not noticed any odor nor change in the wound.; admitting diagnosis sepsis; suspected pna as source  Clinical Impression   Pt admitted with above. Pt currently with functional limitations due to the deficits listed below (see PT Problem List).  Pt will benefit from skilled PT to increase their independence and safety with mobility to allow discharge to the venue listed below.    Would like for pt to be able to tolerate more activity prior to dc home, however, he can dc home at mostly wheelchair transfer level, and he [place] wants to go home.      Follow Up Recommendations Home health PT;Supervision/Assistance - 24 hour (HHOT and RN; and does pt qualify for Aide?)    Equipment Recommendations  None recommended by PT    Recommendations for Other Services OT consult     Precautions / Restrictions Precautions Precautions: Fall Precaution Comments: Reinforced fall prec and OOB with assist      Mobility  Bed Mobility Overal bed mobility: Needs Assistance Bed Mobility: Sidelying to Sit   Sidelying to sit: Min assist       General bed mobility comments: Cues for technique, and min assist to acheive upright  sitting  Transfers Overall transfer level: Needs assistance Equipment used: Rolling walker (2 wheeled) Transfers: Sit to/from Stand Sit to Stand: Min assist         General transfer comment: Min antigravity assist to acheive fully upright standing  Ambulation/Gait Ambulation/Gait assistance: Min assist Ambulation Distance (Feet):  (2-3 forward steps, then needed to sit, then pivot steps to recliner) Assistive device: Rolling walker (2 wheeled) Gait Pattern/deviations: Decreased step length - right;Decreased step length - left     General Gait Details: Fatigued very quickly and needed to sit  Financial trader [employer] (Stroke Patients Only)       Balance                                             Pertinent Vitals/Pain Pain Assessment: No/denies pain    Home Living Family/patient expects to be discharged to:: Private residence Living Arrangements: Spouse/significant other Available Help at Discharge: Family;Available 24 hours/day Type of Home: Mobile home Home Access: Stairs to enter;Ramped entrance Entrance Stairs-Rails: Right;Left;Can reach both Entrance Stairs-Number of Steps: 4 Home Layout: One level Home Equipment: Walker - 2 wheels;Wheelchair - manual;Bedside commode Additional Comments: pt has Pana    Prior Function Level of Independence: Needs assistance  Gait / Transfers Assistance Needed: Wife reports pt. was able to ambulate the length of the home with 2 standing rests as recently as 2 months ago.  Recently he has needed assist for transfers due to weakness from diarrhea. and assist for cleaning following BMs  ADL's / Homemaking Assistance Needed: pt. reports he takes a sponge bath        Hand Dominance        Extremity/Trunk Assessment   Upper Extremity Assessment: Generalized weakness           Lower Extremity Assessment: Generalized weakness         Communication    Communication: No difficulties  Cognition Arousal/Alertness: Awake/alert Behavior During Therapy: WFL for tasks assessed/performed Overall Cognitive Status: Within Functional Limits for tasks assessed                      General Comments      Exercises        Assessment/Plan    PT Assessment Patient needs continued PT services  PT Diagnosis Difficulty walking;Generalized weakness   PT Problem List Decreased strength;Decreased activity tolerance;Decreased balance;Decreased mobility;Decreased knowledge of use of DME;Decreased skin integrity;Decreased knowledge of precautions  PT Treatment Interventions DME instruction;Gait training;Functional mobility training;Therapeutic activities;Therapeutic exercise;Patient/family education   PT Goals (Current goals can be found in the Care Plan section) Acute Rehab PT Goals Patient Stated Goal: REALLY wants to go home tomorrow PT Goal Formulation: With patient Time For Goal Achievement: 11/14/13 Potential to Achieve Goals: Good    Frequency Min 3X/week   Barriers to discharge   Would like for pt to be able to tolerate more activity prior to dc home, however, he can dc home at mostly wheelchair transfer level    Co-evaluation               End of Session   Activity Tolerance: Patient limited by fatigue Patient left: in chair;with call bell/phone within reach Nurse Communication: Mobility status         Time: 9381-8299 PT Time Calculation (min): 18 min   Charges:   PT Evaluation $Initial PT Evaluation Tier I: 1 Procedure PT Treatments $Therapeutic Activity: 8-22 mins   PT G Codes:          Quin Hoop 11/07/2013, 10:59 AM Roney Marion, [place] Pager [phone] Office [phone]

## 2013-11-07 NOTE — Progress Notes (Signed)
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 -wound care  Code Status: full Family Communication: patient and wife Disposition Plan: home tomm after dialysis- family already has home health- will resume-- patient can not walk long distances at base line   Consultants:  nephro  Procedures:      HPI/Subjective: Feeling better, wants to get up   Objective: Filed Vitals:   11/07/13 0647  BP: 115/42  Pulse: 64  Temp: 98 F (36.7 C)  Resp: 18    Intake/Output Summary (Last 24 hours) at 11/07/13 0747 Last data filed at 11/07/13 8675  Gross per 24 hour  Intake      0 ml  Output   1959 ml  Net  -1959 ml   Filed Weights   11/06/13 0823 11/06/13 1300 11/06/13 2319  Weight: 63.2 kg (139 lb 5.3 oz) 61.2 kg (134 lb 14.7 oz) 61.199 kg (134 lb 14.7 oz)    Exam:   General:  A+Ox3, NAD  Cardiovascular: rrr  Respiratory: clear  Abdomen: +BS, soft  Musculoskeletal: moves all 4 ext   Data Reviewed: Basic Metabolic Panel:  Recent Labs Lab 11/04/13 2235 11/04/13 2328 11/05/13 0008 11/05/13 0418 11/06/13 0327 11/07/13 0611  NA 140  --  137 140 139 138  K 3.6*  --  3.3* 3.7 3.7 3.5*  CL 97  --  102 102 101 99  CO2 21  --   --  _0 GLUCOSE 100*  --  89 72 66* 59*  BUN 25*  --  23 28* 40* 15  CREATININE 3.67*  --  3.90* 3.91* 4.81* 2.65*  CALCIUM 8.3*  --   --  7.8* 7.8* 7.5*  MG  --  1.8  --   --   --   --   PHOS  --  3.4  --   --    --   --    Liver Function Tests:  Recent Labs Lab 11/04/13 2235  AST 14  ALT 12  ALKPHOS 76  BILITOT 0.7  PROT 5.5*  ALBUMIN 2.0*   No results found for this basename: LIPASE, AMYLASE,  in the last 168 hours No results found for this basename: AMMONIA,  in the last 168 hours CBC:  Recent Labs Lab 11/04/13 2235 11/05/13 0008 11/05/13 0418 11/06/13 0327 11/07/13 0611  WBC 13.7*  --  14.5* 12.0* 8.4  NEUTROABS 11.8*  --   --   --   --   HGB 11.2* 13.3 9.9* 9.8* 10.1*  HCT 36.6* 39.0 31.4* 30.4* 31.8*  MCV 86.5  --  86.7 85.2 85.9  PLT 139*  --  PLATELET CLUMPS NOTED ON SMEAR, COUNT APPEARS DECREASED 104* 130*   Cardiac Enzymes:  Recent Labs Lab 11/04/13 2236  TROPONINI <0.30   BNP (last 3 results)  Recent Labs  06/08/13 2145 11/04/13 2328  PROBNP 16467.0* 13759.0*   CBG:  Recent Labs Lab 11/06/13 0651  GLUCAP 77    Recent Results (from the past 240 hour(s))  CULTURE, BLOOD (ROUTINE X 2)     Status: None   Collection Time    11/04/13 11:28 PM      Result Value Ref Range Status   Specimen Description BLOOD LEFT ANTECUBITAL   Final   Special Requests BOTTLES DRAWN AEROBIC AND ANAEROBIC 6CC EA   Final   Culture  Setup Time     Final   Value: 11/05/2013 13:51     Performed at [facility]   Culture     Final   Value:        BLOOD CULTURE RECEIVED NO GROWTH TO DATE CULTURE WILL BE HELD FOR 5 DAYS BEFORE ISSUING A FINAL NEGATIVE REPORT     Performed at [facility]   Report Status PENDING   Incomplete  CULTURE, BLOOD (ROUTINE X 2)     Status: None   Collection Time    11/04/13 11:32 PM      Result Value Ref Range Status   Specimen Description BLOOD LEFT HAND   Final   Special Requests BOTTLES DRAWN AEROBIC ONLY 2CC   Final   Culture  Setup Time     Final   Value: 11/05/2013 13:51     Performed at [facility]   Culture     Final   Value:        BLOOD CULTURE RECEIVED NO GROWTH TO DATE CULTURE WILL BE HELD FOR 5 DAYS BEFORE  ISSUING A FINAL NEGATIVE REPORT     Performed at [facility]   Report Status PENDING   Incomplete  MRSA PCR SCREENING     Status: Abnormal   Collection Time    11/05/13  4:01 AM      Result Value Ref Range Status   MRSA by PCR POSITIVE (*) NEGATIVE Final   Comment:            The GeneXpert MRSA Assay (FDA     approved for NASAL specimens     only), is one component of a     comprehensive MRSA colonization     surveillance program. It is not     intended to diagnose MRSA     infection nor to guide or     monitor treatment for     MRSA infections.     RESULT CALLED TO, READ BACK BY AND VERIFIED WITH:     DUFFY,S RN [phone] 11/05/13 MITCHELL,L  URINE CULTURE     Status: None   Collection Time    11/05/13  6:25 PM      Result Value Ref Range Status   Specimen Description URINE, RANDOM   Final   Special Requests Immunocompromised   Final   Culture  Setup Time     Final   Value: 11/06/2013 02:05     Performed at [place]     Final   Value: 40,000 COLONIES/ML     Performed at [facility]   Culture     Final   Value: ESCHERICHIA COLI     Performed at [facility]   Report Status PENDING   Incomplete     Studies: No results found.  Scheduled Meds: . acyclovir  400 mg Oral Daily  . amiodarone  200 mg Oral Daily  . Chlorhexidine Gluconate Cloth  6 each Topical Q0600  . [START ON 11/08/2013] darbepoetin (ARANESP) injection - DIALYSIS  160 mcg Intravenous Q Wed-HD  . dexamethasone  40 mg Oral Q7  days  . [START ON 11/08/2013] ferric gluconate (FERRLECIT/NULECIT) IV  62.5 mg Intravenous Weekly  . ferrous sulfate  325 mg Oral Q breakfast  . heparin  5,000 Units Subcutaneous 3 times per day  . ipratropium-albuterol  3 mL Nebulization TID  . megestrol  800 mg Oral Daily  . midodrine  10 mg Oral TID WC  . multivitamin  1 tablet Oral QHS  . mupirocin ointment  1 application Nasal BID  . piperacillin-tazobactam (ZOSYN)  IV  2.25 g  Intravenous 3 times per day  . sodium chloride  3 mL Intravenous Q12H  . vancomycin  750 mg Intravenous Q M,W,F-HD   Continuous Infusions:  Antibiotics Given (last 72 hours)   Date/Time Action Medication Dose Rate   11/05/13 0452 Given   vancomycin (VANCOCIN) 500 mg in sodium chloride 0.9 % 100 mL IVPB 500 mg 100 mL/hr   11/05/13 0500 Given   piperacillin-tazobactam (ZOSYN) IVPB 2.25 g 2.25 g 100 mL/hr   11/05/13 1037 Given   acyclovir (ZOVIRAX) 200 MG capsule 400 mg 400 mg    11/05/13 1313 Given   piperacillin-tazobactam (ZOSYN) IVPB 2.25 g 2.25 g 100 mL/hr   11/05/13 2139 Given   piperacillin-tazobactam (ZOSYN) IVPB 2.25 g 2.25 g 100 mL/hr   11/06/13 0515 Given   piperacillin-tazobactam (ZOSYN) IVPB 2.25 g 2.25 g 100 mL/hr   11/06/13 1342 Given  [given  on hd]   vancomycin (VANCOCIN) IVPB 750 mg/150 ml premix 750 mg 150 mL/hr   11/06/13 1449 Given   acyclovir (ZOVIRAX) 200 MG capsule 400 mg 400 mg    11/06/13 1453 Given   piperacillin-tazobactam (ZOSYN) IVPB 2.25 g 2.25 g 100 mL/hr   11/06/13 2330 Given   piperacillin-tazobactam (ZOSYN) IVPB 2.25 g 2.25 g 100 mL/hr   11/07/13 0606 Given   piperacillin-tazobactam (ZOSYN) IVPB 2.25 g 2.25 g 100 mL/hr      Principal Problem:   Severe sepsis Active Problems:   Multiple myeloma   HCAP (healthcare-associated pneumonia)   PNA (pneumonia)   End stage renal disease   Hypotension    Time spent: 35 min    Scott [name], [facility] Pager [phone]. If 7PM-7AM, please contact night-coverage at [email or web address], password [facility] 11/07/2013, 7:47 AM  LOS: 3 days

## 2013-11-08 LAB — RENAL FUNCTION PANEL
ANION GAP: 17 — AB (ref 5–15)
Albumin: 1.7 g/dL — ABNORMAL LOW (ref 3.5–5.2)
BUN: 31 mg/dL — ABNORMAL HIGH (ref 6–23)
CALCIUM: 8.1 mg/dL — AB (ref 8.4–10.5)
CHLORIDE: 96 meq/L (ref 96–112)
CO2: 23 meq/L (ref 19–32)
Creatinine, Ser: 4.03 mg/dL — ABNORMAL HIGH (ref 0.50–1.35)
GFR calc non Af Amer: 14 mL/min — ABNORMAL LOW (ref >90)
GFR, EST AFRICAN AMERICAN: 16 mL/min — AB (ref >90)
GLUCOSE: 178 mg/dL — AB (ref 70–99)
POTASSIUM: 4 meq/L (ref 3.7–5.3)
Phosphorus: 4.8 mg/dL — ABNORMAL HIGH (ref 2.3–4.6)
Sodium: 136 mEq/L — ABNORMAL LOW (ref 137–147)

## 2013-11-08 LAB — CBC
HEMATOCRIT: 29.4 % — AB (ref 39.0–52.0)
HEMOGLOBIN: 9.6 g/dL — AB (ref 13.0–17.0)
MCH: 27.4 pg (ref 26.0–34.0)
MCHC: 32.7 g/dL (ref 30.0–36.0)
MCV: 84 fL (ref 78.0–100.0)
Platelets: 114 10*3/uL — ABNORMAL LOW (ref 150–400)
RBC: 3.5 MIL/uL — ABNORMAL LOW (ref 4.22–5.81)
RDW: 19.5 % — ABNORMAL HIGH (ref 11.5–15.5)
WBC: 9.6 10*3/uL (ref 4.0–10.5)

## 2013-11-08 LAB — GLUCOSE, CAPILLARY: GLUCOSE-CAPILLARY: 194 mg/dL — AB (ref 70–99)

## 2013-11-08 MED ORDER — DARBEPOETIN ALFA-POLYSORBATE 100 MCG/0.5ML IJ SOLN
INTRAMUSCULAR | Status: AC
  Administered 2013-11-08: 100 ug
  Filled 2013-11-08: qty 1

## 2013-11-08 MED ORDER — PENTAFLUOROPROP-TETRAFLUOROETH EX AERO: 1.0000 "application " | INHALATION_SPRAY | CUTANEOUS | Status: DC | PRN

## 2013-11-08 MED ORDER — SODIUM CHLORIDE 0.9 % IV SOLN: 100.0000 mL | INTRAVENOUS | Status: DC | PRN

## 2013-11-08 MED ORDER — DARBEPOETIN ALFA-POLYSORBATE 60 MCG/0.3ML IJ SOLN
INTRAMUSCULAR | Status: AC
  Administered 2013-11-08: 60 ug
  Filled 2013-11-08: qty 0.3

## 2013-11-08 MED ORDER — ALTEPLASE 2 MG IJ SOLR
2.0000 mg | Freq: Once | INTRAMUSCULAR | Status: DC | PRN
  Filled 2013-11-08: qty 2

## 2013-11-08 MED ORDER — LIDOCAINE-PRILOCAINE 2.5-2.5 % EX CREA
1.0000 "application " | TOPICAL_CREAM | CUTANEOUS | Status: DC | PRN
  Filled 2013-11-08: qty 5

## 2013-11-08 MED ORDER — CEFPODOXIME PROXETIL 200 MG PO TABS
200.0000 mg | ORAL_TABLET | ORAL | Status: DC
Start: 2013-11-08 — End: 2013-11-25

## 2013-11-08 MED ORDER — NEPRO/CARBSTEADY PO LIQD
237.0000 mL | ORAL | Status: DC | PRN
  Filled 2013-11-08: qty 237

## 2013-11-08 MED ORDER — CEFPODOXIME PROXETIL 200 MG PO TABS
200.0000 mg | ORAL_TABLET | ORAL | Status: DC
  Administered 2013-11-08: 200 mg via ORAL
  Filled 2013-11-08: qty 1

## 2013-11-08 MED ORDER — LIDOCAINE HCL (PF) 1 % IJ SOLN: 5.0000 mL | INTRAMUSCULAR | Status: DC | PRN

## 2013-11-08 MED ORDER — HEPARIN SODIUM (PORCINE) 1000 UNIT/ML DIALYSIS: 1000.0000 [IU] | INTRAMUSCULAR | Status: DC | PRN

## 2013-11-08 NOTE — Progress Notes (Signed)
Subjective:  Restless night, breathing better now on nebulizer treatment  Objective: Vital signs in last 24 hours: Temp:  [98 F (36.7 C)-98.8 F (37.1 C)] 98 F (36.7 C) (08/26 5284) Pulse Rate:  [71-76] 75 (08/26 0608) Resp:  [17-19] 17 (08/26 0608) BP: (114-141)/(47-63) 121/48 mmHg (08/26 0608) SpO2:  [95 %-99 %] 97 % (08/26 0804) Weight change:   Intake/Output from previous day: 08/25 0701 - 08/26 0700 In: 600 [P.O.:600] Out: -  Intake/Output this shift:   Lab Results:  Recent Labs  11/07/13 0611 11/08/13 0543  WBC 8.4 9.6  HGB 10.1* 9.6*  HCT 31.8* 29.4*  PLT 130* 114*   BMET:  Recent Labs  11/07/13 0611 11/08/13 0543  NA 138 136*  K 3.5* 4.0  CL 99 96  CO2 27 23  GLUCOSE 59* 178*  BUN 15 31*  CREATININE 2.65* 4.03*  CALCIUM 7.5* 8.1*  ALBUMIN  --  1.7*   No results found for this basename: PTH,  in the last 72 hours Iron Studies: No results found for this basename: IRON, TIBC, TRANSFERRIN, FERRITIN,  in the last 72 hours  Studies/Results: No results found.  EXAM:  General appearance: Alert, in no apparent distress  Resp: CTA without rales, rhonchi, or wheezes  Cardio: RRR without murmur or rub  GI: + BS, soft and nontender  Extremities: No edema  Access: R IJ catheter; maturing AVF @ RUA with + bruit   Dialysis Orders: MWF [place] .  63.5kgs 3K/2.25Ca 4hrs 2000 Heparin R IJ 400/1.5  No Vit D Aranesp 160 q week Venofer 50 q week  Assessment/Plan: 1. Fever/weakness - Improving, BCs negative so far (with R IJ catheter), urine culture + for E coli, possible PNA, sacral ulcer less likely source; on Vancomycin & Zosyn. For dc today on Vantin (cefpodixime) po at appropriate reduced dosing for esrd 2. ESRD - HD on MWF @ [facility], K 4. HD pending today.  3. HTN/Volume - BP 121/48 on Midodrine 10 mg tid; wt 61.2 kg s/p net UF 1.8 L on 8/24, but did not tolerate well.  4. Anemia - Hgb 9.6, Aranesp 160 mcg on Wed, weekly Fe.  5. Sec HPT - Ca 8.1 (9.9  corrected); no Hectorol or binders.  6. Nutrition - renal diet, on Megace, multivitamin.  7. Dialysis access - R BVT ([address]st St 4/27, 2nd St 6/19), may use in Sept per Dr. Donnetta Hutching.     LOS: 4 days   Scott [name],Scott [name] 11/08/2013,8:52 AM  Pt seen, examined, agree w assess/plan as above with additions as indicated.  Kelly Splinter MD pager [phone]    cell [phone] 11/08/2013, 1:57 PM

## 2013-11-08 NOTE — Progress Notes (Signed)
PT Cancellation Note  Patient Details Name: BELINDA BRINGHURST MRN: [id number] DOB: 1940-07-01   Cancelled Treatment:    Reason Eval/Treat Not Completed: Patient at procedure or test/unavailable.  Pt gone to dialysis when PT came to check on pt.  Will continue to follow.    Denice Bors 11/08/2013, 3:50 PM

## 2013-11-08 NOTE — Procedures (Signed)
I was present at this dialysis session, have reviewed the session itself and made  appropriate changes  Kelly Splinter MD (pgr) [phone]    [phone] 11/08/2013, 1:52 PM

## 2013-11-08 NOTE — Discharge Summary (Signed)
[facility]  Physician Discharge Summary   Patient ID: Scott [name] MRN: [id number] DOB/AGE: Sep 23, 1940 73 y.o.  Admit date: 11/04/2013 Discharge date: 11/08/2013  PCP: Bufford Buttner, MD  DISCHARGE DIAGNOSES:  Principal Problem:   Severe sepsis Active Problems:   Multiple myeloma   HCAP (healthcare-associated pneumonia)   PNA (pneumonia)   End stage renal disease   Hypotension   Decubitus ulcer of sacral region, stage 4   RECOMMENDATIONS FOR OUTPATIENT FOLLOW UP: 1. Dialysis MWF 2. Has Stage 4 Sacral Decube and stage 3 left buttock ulcer  DISCHARGE CONDITION: fair  Diet recommendation: Heart healthy  Filed Weights   11/06/13 0823 11/06/13 1300 11/06/13 2319  Weight: 63.2 kg (139 lb 5.3 oz) 61.2 kg (134 lb 14.7 oz) 61.199 kg (134 lb 14.7 oz)    INITIAL HISTORY: Scott [name] is a 73 y.o. male with ESRD secondary to MM, who presented to the ED with c/o fever, chills, generalized weakness. No clear source was initially noted. Catheter site looked clean. Patient was hypotensive in the ED which improved after fluid bolus.  Consultations:  Nephrology  Procedures:  Dialysis  HOSPITAL COURSE:   Severe sepsis causing hypotension Patient has been stable hemodynamically. He was initially started on vanc and Zosyn. Blood cultures were negative. Urine grew 40k colonies of E coli. He also had a cough. He did report burning sensation with urination. He will be changed over to [facility] and is stable for discharge.   Possible URTI No pneumonia noted on CXR. He reports cough is better. Will discharge on Vantin. No wheezing.  Possible UTI E coli grew in urine though only 40k colonies. Sensitive to Ceftriaxone only. Should be covered by Vantin.   ESRD MWF schedule.   History of Multiple myeloma Continue weekly dexamethasone   Thrombocytopenia Stable   Tobacco abuse  Counseled  Leukocytosis  Resolved   Stage IV pressure ulcer of sacrum and Stage III  pressure ulcer left buttock  Wound care was following. HH to continue follow.  Patient feels better today. Denies any discomfort. Breathing is improved. He wants to go home. He is stable for discharge after he undergoes dialysis today and tolerates Vantin.   PERTINENT LABS:  The results of significant diagnostics from this hospitalization (including imaging, microbiology, ancillary and laboratory) are listed below for reference.    Microbiology: Recent Results (from the past 240 hour(s))  CULTURE, BLOOD (ROUTINE X 2)     Status: None   Collection Time    11/04/13 11:28 PM      Result Value Ref Range Status   Specimen Description BLOOD LEFT ANTECUBITAL   Final   Special Requests BOTTLES DRAWN AEROBIC AND ANAEROBIC 6CC EA   Final   Culture  Setup Time     Final   Value: 11/05/2013 13:51     Performed at [facility]   Culture     Final   Value:        BLOOD CULTURE RECEIVED NO GROWTH TO DATE CULTURE WILL BE HELD FOR 5 DAYS BEFORE ISSUING A FINAL NEGATIVE REPORT     Performed at [facility]   Report Status PENDING   Incomplete  CULTURE, BLOOD (ROUTINE X 2)     Status: None   Collection Time    11/04/13 11:32 PM      Result Value Ref Range Status   Specimen Description BLOOD LEFT HAND   Final   Special Requests BOTTLES DRAWN AEROBIC ONLY [place]   Final  Culture  Setup Time     Final   Value: 11/05/2013 13:51     Performed at [facility]   Culture     Final   Value:        BLOOD CULTURE RECEIVED NO GROWTH TO DATE CULTURE WILL BE HELD FOR 5 DAYS BEFORE ISSUING A FINAL NEGATIVE REPORT     Performed at [facility]   Report Status PENDING   Incomplete  MRSA PCR SCREENING     Status: Abnormal   Collection Time    11/05/13  4:01 AM      Result Value Ref Range Status   MRSA by PCR POSITIVE (*) NEGATIVE Final   Comment:            The GeneXpert MRSA Assay (FDA     approved for NASAL specimens     only), is one component of a     comprehensive MRSA  colonization     surveillance program. It is not     intended to diagnose MRSA     infection nor to guide or     monitor treatment for     MRSA infections.     RESULT CALLED TO, READ BACK BY AND VERIFIED WITH:     DUFFY,S RN [phone] 11/05/13 MITCHELL,L  URINE CULTURE     Status: None   Collection Time    11/05/13  6:25 PM      Result Value Ref Range Status   Specimen Description URINE, RANDOM   Final   Special Requests Immunocompromised   Final   Culture  Setup Time     Final   Value: 11/06/2013 02:05     Performed at [place]     Final   Value: 40,000 COLONIES/ML     Performed at [facility]   Culture     Final   Value: ESCHERICHIA COLI     Performed at [facility]   Report Status 11/07/2013 FINAL   Final   Organism ID, Bacteria ESCHERICHIA COLI   Final     Labs: Basic Metabolic Panel:  Recent Labs Lab 11/04/13 2235 11/04/13 2328 11/05/13 0008 11/05/13 0418 11/06/13 0327 11/07/13 0611 11/08/13 0543  NA 140  --  137 140 139 138 136*  K 3.6*  --  3.3* 3.7 3.7 3.5* 4.0  CL 97  --  102 102 101 99 96  CO2 21  --   --  24 23 27 23   GLUCOSE 100*  --  89 72 66* 59* 178*  BUN 25*  --  23 28* 40* 15 31*  CREATININE 3.67*  --  3.90* 3.91* 4.81* 2.65* 4.03*  CALCIUM 8.3*  --   --  7.8* 7.8* 7.5* 8.1*  MG  --  1.8  --   --   --   --   --   PHOS  --  3.4  --   --   --   --  4.8*   Liver Function Tests:  Recent Labs Lab 11/04/13 2235 11/08/13 0543  AST 14  --   ALT 12  --   ALKPHOS 76  --   BILITOT 0.7  --   PROT 5.5*  --   ALBUMIN 2.0* 1.7*   CBC:  Recent Labs Lab 11/04/13 2235 11/05/13 0008 11/05/13 0418 11/06/13 0327 11/07/13 0611 11/08/13 0543  WBC 13.7*  --  14.5* 12.0* 8.4 9.6  NEUTROABS 11.8*  --   --   --   --   --  HGB 11.2* 13.3 9.9* 9.8* 10.1* 9.6*  HCT 36.6* 39.0 31.4* 30.4* 31.8* 29.4*  MCV 86.5  --  86.7 85.2 85.9 84.0  PLT 139*  --  PLATELET CLUMPS NOTED ON SMEAR, COUNT APPEARS DECREASED 104* 130*  114*   Cardiac Enzymes:  Recent Labs Lab 11/04/13 2236  TROPONINI <0.30   BNP: BNP (last 3 results)  Recent Labs  06/08/13 2145 11/04/13 2328  PROBNP 16467.0* 13759.0*   CBG:  Recent Labs Lab 11/06/13 0651 11/07/13 0804 11/07/13 0842 11/07/13 1635 11/08/13 0743  GLUCAP 77 55* 63* 228* 194*     IMAGING STUDIES Dg Chest 2 View  10/16/2013   CLINICAL DATA:  Cough and wheezing  EXAM: CHEST  2 VIEW  COMPARISON:  09/04/2013  FINDINGS: Right-sided dual lumen dialysis catheter is in place with tips over the right atrium. Heart size is mildly enlarged. There is persistent left greater than right lower lobe pulmonary parenchymal opacification, but increased since the prior exam. A few possible interstitial Kerley B-lines are noted at the lung periphery. Small left and trace right pleural effusions are noted. Allowing for differences in technique, there is no significant interval change. Left-sided pacer in place.  IMPRESSION: Increased bibasilar opacities which could indicate superimposed alveolar edema with coexistent interstitial edema and effusions.   Electronically Signed   By: Conchita Paris M.D.   On: 10/16/2013 13:19   Dg Chest Portable 1 View  11/04/2013   CLINICAL DATA:  Chest pain  EXAM: PORTABLE CHEST - 1 VIEW  COMPARISON:  10/16/2013  FINDINGS: Dual-chamber pacer leads from the left are in unremarkable position. There is a right IJ dialysis catheter, tip at the upper right atrium.  Normal heart size and negative upper mediastinal contours. There is a trace left pleural effusion with overlying bandlike opacity suggesting atelectasis. The right chest is clear. No pneumothorax.  IMPRESSION: Small left pleural effusion with atelectasis, decreased from 10/16/2013.   Electronically Signed   By: Jorje Guild M.D.   On: 11/04/2013 23:59    DISCHARGE EXAMINATION: Filed Vitals:   11/07/13 2120 11/07/13 2140 11/08/13 0608 11/08/13 0804  BP:  141/63 121/48   Pulse:  76 75   Temp:   98.5 F (36.9 C) 98 F (36.7 C)   TempSrc:  Oral Oral   Resp:  18 17   Height:      Weight:      SpO2: 99% 95% 96% 97%   General appearance: alert, cooperative, appears stated age and no distress Resp: decreassed air entry at bases without crackles or wheezing, Cardio: regular rate and rhythm, S1, S2 normal, no murmur, click, rub or gallop Extremities: extremities normal, atraumatic, no cyanosis or edema Neurologic: No focal deficits  DISPOSITION: Home with wife  Discharge Instructions   Call MD for:  difficulty breathing, headache or visual disturbances    Complete by:  As directed      Call MD for:  extreme fatigue    Complete by:  As directed      Call MD for:  persistant dizziness or light-headedness    Complete by:  As directed      Call MD for:  severe uncontrolled pain    Complete by:  As directed      Call MD for:  temperature >100.4    Complete by:  As directed      Diet - low sodium heart healthy    Complete by:  As directed      Increase activity slowly  Complete by:  As directed            ALLERGIES: No Known Allergies  Current Discharge Medication List    START taking these medications   Details  cefpodoxime (VANTIN) 200 MG tablet Take 1 tablet (200 mg total) by mouth every Monday, Wednesday, and Friday at 6 PM. Starting 8/28 for 4 doses. Qty: 4 tablet, Refills: 0      CONTINUE these medications which have NOT CHANGED   Details  ACCU-CHEK AVIVA PLUS test strip 1 each by Other route See admin instructions. Check blood sugar once daily.    ACCU-CHEK SOFTCLIX LANCETS lancets 1 each by Other route See admin instructions. Check blood sugar once daily.    acyclovir (ZOVIRAX) 400 MG tablet Take 400 mg by mouth daily.    amiodarone (PACERONE) 200 MG tablet Take 200 mg by mouth daily.    dexamethasone (DECADRON) 4 MG tablet Take 40 mg by mouth every 7 (seven) days. Every Tuesday.    ferrous sulfate 325 (65 FE) MG tablet Take 325 mg by mouth daily with  breakfast.    ibuprofen (ADVIL,MOTRIN) 200 MG tablet Take 200 mg by mouth every 6 (six) hours as needed.    ipratropium-albuterol (DUONEB) 0.5-2.5 (3) MG/3ML SOLN Take 3 mLs by nebulization every 4 (four) hours as needed (for shortness of breath).    megestrol (MEGACE) 40 MG/ML suspension Take 800 mg by mouth daily.    Menthol-Methyl Salicylate (MUSCLE RUB EX) Apply 1 application topically as needed. For muscle cramps    midodrine (PROAMATINE) 10 MG tablet Take 1 tablet (10 mg total) by mouth 3 (three) times daily with meals. Qty: 90 tablet, Refills: 0    traMADol (ULTRAM) 50 MG tablet Take 1 tablet (50 mg total) by mouth every 6 (six) hours as needed for moderate pain. Qty: 30 tablet, Refills: 0       Follow-up Information   Follow up with Bufford Buttner, MD. Schedule an appointment as soon as possible for a visit in 1 week. (post hospitalization follow up)    Specialty:  Family Medicine   Contact information:   [place] [address] [phone]       Follow up with Hemodialysis as before.      TOTAL DISCHARGE TIME: 35 mins  [facility] Pager [phone]  11/08/2013, 9:36 AM  Disclaimer: This note was dictated with voice recognition software. Similar sounding words can inadvertently be transcribed and may not be corrected upon review.

## 2013-11-08 NOTE — Progress Notes (Signed)
Patient's disharged pathway and education in epic was accomplished by the student nurse with the supervision by the registered nurse.Verbal education related to his medical problem on this admission was done by the registered,like cardinal symptoms of infection.,medical appointment , other education stated on discharged papers and how to take his discharged antibiotic.

## 2013-11-08 NOTE — Discharge Instructions (Signed)

## 2013-11-08 NOTE — Care Management Note (Signed)
CARE MANAGEMENT NOTE 11/08/2013  Patient:  Scott [name], Scott [name]   Account Number:  [id number]  Date Initiated:  11/07/2013  Documentation initiated by:  Elaynah [place]  Subjective/Objective Assessment:   CM following for progression and d/c planning.     Action/Plan:   11/07/2013 Met with pt who states that he is active for [facility] services with [facility] and wishes to continue with that agency. [facility] notifed and orders faxed.   Anticipated DC Date:  11/08/2013   Anticipated DC Plan:  [place]         Choice offered to / List presented to:          [facility] arranged  HH-1 RN  [place] agency  [place]   Status of service:  Completed, signed off Medicare Important Message given?  YES (If response is "NO", the following Medicare IM given date fields will be blank) Date Medicare IM given:  11/08/2013 Medicare IM given by:  Lea Walbert Date Additional Medicare IM given:   Additional Medicare IM given by:    Discharge Disposition:  [place]  Per UR Regulation:    If discussed at Long Length of Stay Meetings, dates discussed:    Comments:

## 2013-11-09 DIAGNOSIS — L8993 Pressure ulcer of unspecified site, stage 3: Secondary | ICD-10-CM | POA: Diagnosis not present

## 2013-11-09 DIAGNOSIS — E119 Type 2 diabetes mellitus without complications: Secondary | ICD-10-CM | POA: Diagnosis not present

## 2013-11-09 DIAGNOSIS — N189 Chronic kidney disease, unspecified: Secondary | ICD-10-CM | POA: Diagnosis not present

## 2013-11-09 DIAGNOSIS — Z85820 Personal history of malignant melanoma of skin: Secondary | ICD-10-CM | POA: Diagnosis not present

## 2013-11-09 DIAGNOSIS — L89109 Pressure ulcer of unspecified part of back, unspecified stage: Secondary | ICD-10-CM | POA: Diagnosis present

## 2013-11-09 DIAGNOSIS — I129 Hypertensive chronic kidney disease with stage 1 through stage 4 chronic kidney disease, or unspecified chronic kidney disease: Secondary | ICD-10-CM | POA: Diagnosis not present

## 2013-11-09 DIAGNOSIS — Z992 Dependence on renal dialysis: Secondary | ICD-10-CM | POA: Diagnosis not present

## 2013-11-09 LAB — GLUCOSE, CAPILLARY
GLUCOSE-CAPILLARY: 256 mg/dL — AB (ref 70–99)
Glucose-Capillary: 72 mg/dL (ref 70–99)
Glucose-Capillary: 90 mg/dL (ref 70–99)
Glucose-Capillary: 226 mg/dL — ABNORMAL HIGH (ref 70–99)

## 2013-11-11 LAB — CULTURE, BLOOD (ROUTINE X 2)
CULTURE: NO GROWTH
Culture: NO GROWTH

## 2013-11-16 ENCOUNTER — Encounter (HOSPITAL_BASED_OUTPATIENT_CLINIC_OR_DEPARTMENT_OTHER): Payer: Medicare HMO | Attending: Internal Medicine

## 2013-11-16 DIAGNOSIS — L89109 Pressure ulcer of unspecified part of back, unspecified stage: Secondary | ICD-10-CM | POA: Insufficient documentation

## 2013-11-16 DIAGNOSIS — L8993 Pressure ulcer of unspecified site, stage 3: Secondary | ICD-10-CM | POA: Insufficient documentation

## 2013-11-25 ENCOUNTER — Emergency Department (HOSPITAL_COMMUNITY): Payer: Medicare HMO

## 2013-11-25 ENCOUNTER — Inpatient Hospital Stay (HOSPITAL_COMMUNITY)
Admission: EM | Admit: 2013-11-25 | Discharge: 2013-11-26 | DRG: 871 | Discharge status: Against Medical Advice | Payer: Medicare HMO | Attending: Internal Medicine | Admitting: Internal Medicine

## 2013-11-25 ENCOUNTER — Encounter (HOSPITAL_COMMUNITY): Payer: Self-pay | Admitting: Emergency Medicine

## 2013-11-25 DIAGNOSIS — L8994 Pressure ulcer of unspecified site, stage 4: Secondary | ICD-10-CM | POA: Diagnosis present

## 2013-11-25 DIAGNOSIS — L89309 Pressure ulcer of unspecified buttock, unspecified stage: Secondary | ICD-10-CM | POA: Diagnosis present

## 2013-11-25 DIAGNOSIS — Z950 Presence of cardiac pacemaker: Secondary | ICD-10-CM

## 2013-11-25 DIAGNOSIS — R0602 Shortness of breath: Secondary | ICD-10-CM

## 2013-11-25 DIAGNOSIS — L89304 Pressure ulcer of unspecified buttock, stage 4: Secondary | ICD-10-CM | POA: Diagnosis present

## 2013-11-25 DIAGNOSIS — E43 Unspecified severe protein-calorie malnutrition: Secondary | ICD-10-CM

## 2013-11-25 DIAGNOSIS — M19019 Primary osteoarthritis, unspecified shoulder: Secondary | ICD-10-CM | POA: Diagnosis present

## 2013-11-25 DIAGNOSIS — N2581 Secondary hyperparathyroidism of renal origin: Secondary | ICD-10-CM | POA: Diagnosis present

## 2013-11-25 DIAGNOSIS — D649 Anemia, unspecified: Secondary | ICD-10-CM | POA: Diagnosis present

## 2013-11-25 DIAGNOSIS — L89324 Pressure ulcer of left buttock, stage 4: Secondary | ICD-10-CM

## 2013-11-25 DIAGNOSIS — E119 Type 2 diabetes mellitus without complications: Secondary | ICD-10-CM | POA: Diagnosis present

## 2013-11-25 DIAGNOSIS — R6520 Severe sepsis without septic shock: Secondary | ICD-10-CM

## 2013-11-25 DIAGNOSIS — J189 Pneumonia, unspecified organism: Secondary | ICD-10-CM | POA: Diagnosis present

## 2013-11-25 DIAGNOSIS — I120 Hypertensive chronic kidney disease with stage 5 chronic kidney disease or end stage renal disease: Secondary | ICD-10-CM | POA: Diagnosis present

## 2013-11-25 DIAGNOSIS — H269 Unspecified cataract: Secondary | ICD-10-CM | POA: Diagnosis present

## 2013-11-25 DIAGNOSIS — E872 Acidosis, unspecified: Secondary | ICD-10-CM

## 2013-11-25 DIAGNOSIS — F17200 Nicotine dependence, unspecified, uncomplicated: Secondary | ICD-10-CM | POA: Diagnosis present

## 2013-11-25 DIAGNOSIS — Z85828 Personal history of other malignant neoplasm of skin: Secondary | ICD-10-CM | POA: Diagnosis not present

## 2013-11-25 DIAGNOSIS — A0472 Enterocolitis due to Clostridium difficile, not specified as recurrent: Secondary | ICD-10-CM

## 2013-11-25 DIAGNOSIS — IMO0002 Reserved for concepts with insufficient information to code with codable children: Secondary | ICD-10-CM

## 2013-11-25 DIAGNOSIS — L89109 Pressure ulcer of unspecified part of back, unspecified stage: Secondary | ICD-10-CM | POA: Diagnosis present

## 2013-11-25 DIAGNOSIS — N186 End stage renal disease: Secondary | ICD-10-CM | POA: Diagnosis present

## 2013-11-25 DIAGNOSIS — D61818 Other pancytopenia: Secondary | ICD-10-CM

## 2013-11-25 DIAGNOSIS — Z992 Dependence on renal dialysis: Secondary | ICD-10-CM | POA: Diagnosis not present

## 2013-11-25 DIAGNOSIS — E876 Hypokalemia: Secondary | ICD-10-CM

## 2013-11-25 DIAGNOSIS — C9000 Multiple myeloma not having achieved remission: Secondary | ICD-10-CM

## 2013-11-25 DIAGNOSIS — R197 Diarrhea, unspecified: Secondary | ICD-10-CM

## 2013-11-25 DIAGNOSIS — L89154 Pressure ulcer of sacral region, stage 4: Secondary | ICD-10-CM

## 2013-11-25 DIAGNOSIS — A419 Sepsis, unspecified organism: Secondary | ICD-10-CM

## 2013-11-25 DIAGNOSIS — E162 Hypoglycemia, unspecified: Secondary | ICD-10-CM

## 2013-11-25 DIAGNOSIS — E875 Hyperkalemia: Secondary | ICD-10-CM

## 2013-11-25 LAB — PROTIME-INR
INR: 1.39 (ref 0.00–1.49)
Prothrombin Time: 17.1 seconds — ABNORMAL HIGH (ref 11.6–15.2)

## 2013-11-25 LAB — CBC WITH DIFFERENTIAL/PLATELET
Basophils Absolute: 0 10*3/uL (ref 0.0–0.1)
Basophils Relative: 0 % (ref 0–1)
EOS PCT: 0 % (ref 0–5)
Eosinophils Absolute: 0 10*3/uL (ref 0.0–0.7)
HEMATOCRIT: 41.9 % (ref 39.0–52.0)
Hemoglobin: 13.1 g/dL (ref 13.0–17.0)
LYMPHS ABS: 0.5 10*3/uL — AB (ref 0.7–4.0)
Lymphocytes Relative: 2 % — ABNORMAL LOW (ref 12–46)
MCH: 28.3 pg (ref 26.0–34.0)
MCHC: 31.3 g/dL (ref 30.0–36.0)
MCV: 90.5 fL (ref 78.0–100.0)
MONO ABS: 0.8 10*3/uL (ref 0.1–1.0)
MONOS PCT: 3 % (ref 3–12)
NEUTROS ABS: 25.2 10*3/uL — AB (ref 1.7–7.7)
Neutrophils Relative %: 95 % — ABNORMAL HIGH (ref 43–77)
Platelets: 135 10*3/uL — ABNORMAL LOW (ref 150–400)
RBC: 4.63 MIL/uL (ref 4.22–5.81)
RDW: 20.7 % — ABNORMAL HIGH (ref 11.5–15.5)
WBC: 26.5 10*3/uL — AB (ref 4.0–10.5)

## 2013-11-25 LAB — URINALYSIS, ROUTINE W REFLEX MICROSCOPIC
Glucose, UA: NEGATIVE mg/dL
Ketones, ur: 15 mg/dL — AB
NITRITE: NEGATIVE
PH: 8.5 — AB (ref 5.0–8.0)
Protein, ur: 100 mg/dL — AB
SPECIFIC GRAVITY, URINE: 1.014 (ref 1.005–1.030)
Urobilinogen, UA: 1 mg/dL (ref 0.0–1.0)

## 2013-11-25 LAB — COMPREHENSIVE METABOLIC PANEL
ALT: 18 U/L (ref 0–53)
AST: 20 U/L (ref 0–37)
Albumin: 2.1 g/dL — ABNORMAL LOW (ref 3.5–5.2)
Alkaline Phosphatase: 81 U/L (ref 39–117)
Anion gap: 19 — ABNORMAL HIGH (ref 5–15)
BILIRUBIN TOTAL: 0.7 mg/dL (ref 0.3–1.2)
BUN: 22 mg/dL (ref 6–23)
CO2: 22 meq/L (ref 19–32)
Calcium: 8.3 mg/dL — ABNORMAL LOW (ref 8.4–10.5)
Chloride: 102 mEq/L (ref 96–112)
Creatinine, Ser: 2.92 mg/dL — ABNORMAL HIGH (ref 0.50–1.35)
GFR, EST AFRICAN AMERICAN: 23 mL/min — AB (ref >90)
GFR, EST NON AFRICAN AMERICAN: 20 mL/min — AB (ref >90)
GLUCOSE: 61 mg/dL — AB (ref 70–99)
POTASSIUM: 4.2 meq/L (ref 3.7–5.3)
SODIUM: 143 meq/L (ref 137–147)
Total Protein: 5.9 g/dL — ABNORMAL LOW (ref 6.0–8.3)

## 2013-11-25 LAB — URINE MICROSCOPIC-ADD ON

## 2013-11-25 LAB — GLUCOSE, CAPILLARY
GLUCOSE-CAPILLARY: 77 mg/dL (ref 70–99)
Glucose-Capillary: 93 mg/dL (ref 70–99)
Glucose-Capillary: 111 mg/dL — ABNORMAL HIGH (ref 70–99)
Glucose-Capillary: 93 mg/dL (ref 70–99)

## 2013-11-25 LAB — I-STAT TROPONIN, ED: Troponin i, poc: 0.08 ng/mL (ref 0.00–0.08)

## 2013-11-25 LAB — CK: Total CK: 31 U/L (ref 7–232)

## 2013-11-25 LAB — I-STAT CG4 LACTIC ACID, ED
LACTIC ACID, VENOUS: 7.96 mmol/L — AB (ref 0.5–2.2)
Lactic Acid, Venous: 3.27 mmol/L — ABNORMAL HIGH (ref 0.5–2.2)

## 2013-11-25 LAB — STREP PNEUMONIAE URINARY ANTIGEN: Strep Pneumo Urinary Antigen: NEGATIVE

## 2013-11-25 LAB — PHOSPHORUS: Phosphorus: 3.8 mg/dL (ref 2.3–4.6)

## 2013-11-25 LAB — LACTIC ACID, PLASMA: Lactic Acid, Venous: 1.4 mmol/L (ref 0.5–2.2)

## 2013-11-25 LAB — CORTISOL: CORTISOL PLASMA: 16.7 ug/dL

## 2013-11-25 MED ORDER — PIPERACILLIN-TAZOBACTAM 3.375 G IVPB 30 MIN
3.3750 g | Freq: Once | INTRAVENOUS | Status: AC
  Administered 2013-11-25: 3.375 g via INTRAVENOUS
  Filled 2013-11-25: qty 50

## 2013-11-25 MED ORDER — IPRATROPIUM-ALBUTEROL 0.5-2.5 (3) MG/3ML IN SOLN: 3.0000 mL | RESPIRATORY_TRACT | Status: DC | PRN

## 2013-11-25 MED ORDER — VANCOMYCIN HCL IN DEXTROSE 750-5 MG/150ML-% IV SOLN: 750.0000 mg | INTRAVENOUS | Status: DC

## 2013-11-25 MED ORDER — SODIUM CHLORIDE 0.9 % IV SOLN
1000.0000 mL | INTRAVENOUS | Status: DC
Start: 2013-11-25 — End: 2013-11-25
  Administered 2013-11-25: 1000 mL via INTRAVENOUS

## 2013-11-25 MED ORDER — SODIUM CHLORIDE 0.9 % IV BOLUS (SEPSIS)
1000.0000 mL | Freq: Once | INTRAVENOUS | Status: AC
Start: 2013-11-25 — End: 2013-11-25
  Administered 2013-11-25: 1000 mL via INTRAVENOUS

## 2013-11-25 MED ORDER — DEXTROSE 5 % IV SOLN: 2.0000 g | INTRAVENOUS | Status: DC

## 2013-11-25 MED ORDER — VANCOMYCIN HCL 10 G IV SOLR
1500.0000 mg | Freq: Once | INTRAVENOUS | Status: AC
  Administered 2013-11-25: 1500 mg via INTRAVENOUS
  Filled 2013-11-25: qty 1500

## 2013-11-25 MED ORDER — SODIUM CHLORIDE 0.9 % IV BOLUS (SEPSIS)
1680.0000 mL | Freq: Once | INTRAVENOUS | Status: AC
  Administered 2013-11-25: 1680 mL via INTRAVENOUS

## 2013-11-25 MED ORDER — HEPARIN SODIUM (PORCINE) 5000 UNIT/ML IJ SOLN
5000.0000 [IU] | Freq: Three times a day (TID) | INTRAMUSCULAR | Status: DC
  Administered 2013-11-25 – 2013-11-26 (×4): 5000 [IU] via SUBCUTANEOUS
  Filled 2013-11-25 (×5): qty 1

## 2013-11-25 MED ORDER — DEXAMETHASONE 6 MG PO TABS: 40.0000 mg | ORAL_TABLET | ORAL | Status: DC

## 2013-11-25 MED ORDER — DEXTROSE 5 % IV SOLN
1.0000 g | INTRAVENOUS | Status: DC
  Administered 2013-11-25: 1 g via INTRAVENOUS
  Filled 2013-11-25: qty 1

## 2013-11-25 MED ORDER — ACYCLOVIR 400 MG PO TABS
400.0000 mg | ORAL_TABLET | Freq: Every day | ORAL | Status: DC
  Administered 2013-11-25: 400 mg via ORAL
  Filled 2013-11-25 (×2): qty 1

## 2013-11-25 MED ORDER — HYDROCODONE-ACETAMINOPHEN 5-325 MG PO TABS
1.0000 | ORAL_TABLET | Freq: Four times a day (QID) | ORAL | Status: DC | PRN
  Administered 2013-11-25: 1 via ORAL
  Filled 2013-11-25: qty 1

## 2013-11-25 MED ORDER — TAMSULOSIN HCL 0.4 MG PO CAPS
0.4000 mg | ORAL_CAPSULE | Freq: Every day | ORAL | Status: DC
  Administered 2013-11-25: 0.4 mg via ORAL
  Filled 2013-11-25 (×2): qty 1

## 2013-11-25 MED ORDER — FERROUS SULFATE 325 (65 FE) MG PO TABS
325.0000 mg | ORAL_TABLET | Freq: Every day | ORAL | Status: DC
  Administered 2013-11-25 – 2013-11-26 (×2): 325 mg via ORAL
  Filled 2013-11-25 (×3): qty 1

## 2013-11-25 MED ORDER — MEGESTROL ACETATE 40 MG/ML PO SUSP
800.0000 mg | Freq: Four times a day (QID) | ORAL | Status: DC
  Administered 2013-11-25 (×4): 800 mg via ORAL
  Filled 2013-11-25 (×8): qty 20

## 2013-11-25 MED ORDER — MIDODRINE HCL 5 MG PO TABS
10.0000 mg | ORAL_TABLET | Freq: Three times a day (TID) | ORAL | Status: DC
Start: 2013-11-25 — End: 2013-11-26
  Administered 2013-11-25 – 2013-11-26 (×3): 10 mg via ORAL
  Filled 2013-11-25 (×6): qty 2

## 2013-11-25 MED ORDER — LEVOFLOXACIN IN D5W 750 MG/150ML IV SOLN
750.0000 mg | Freq: Once | INTRAVENOUS | Status: AC
  Administered 2013-11-25: 750 mg via INTRAVENOUS
  Filled 2013-11-25: qty 150

## 2013-11-25 MED ORDER — HYDROCORTISONE NA SUCCINATE PF 100 MG IJ SOLR
50.0000 mg | Freq: Four times a day (QID) | INTRAMUSCULAR | Status: DC
  Administered 2013-11-25 – 2013-11-26 (×5): 50 mg via INTRAVENOUS
  Filled 2013-11-25 (×9): qty 1

## 2013-11-25 MED ORDER — AMIODARONE HCL 200 MG PO TABS
200.0000 mg | ORAL_TABLET | Freq: Every day | ORAL | Status: DC
  Administered 2013-11-25: 200 mg via ORAL
  Filled 2013-11-25 (×2): qty 1

## 2013-11-25 NOTE — ED Notes (Signed)
This RN called pharmacy to send Vanc.

## 2013-11-25 NOTE — Progress Notes (Signed)
Wound care nurse at bedside. Wound vac changed from home wound vac to hospital wound vac.

## 2013-11-25 NOTE — H&P (Addendum)
[facility] History and Physical  Scott [name] XLK:[id number] DOB: Jun 14, 1940 DOA: 11/25/2013  Referring physician: ED physician PCP: Ala Bent, MD  Specialists:   Chief Complaint: cough, weakness and whole body aching.   HPI: Scott [name] is a 73 y.o. [name]  with past medical history of end-stage renal disease on dialysis (M/W/F), multiple myeloma, pacemaker placement, diabetes type 2, who presents with weakness and whole-body aching.   History was obtained from patient and patient's wife. Patient reports that he started feeling bad in last night. He had bilateral upper thigh pain, back pain and whole body aching in. It is associated with fever and chills. The has chronic cough which has worsened since last night. He has yellow-colored sputum production. He denies nausea, vomiting, abdominal pain, diarrhea, dysuria. Patient's systolic blood pressure drops to 70, and comes back to above 90 after 2 L of normal saline  His wife reports that last Friday he had a fall when he was urinating into the urinal and injured his left face, caused a bruise over his inferior orbital area on the left side. In ED, patient was found to have leukocytosis with WBC 26.5. X-ray showed retrocardiac airspace opacification concerning for pneumonia. IV antibiotics were started. Patient was admitted to step down inpatient.   Review of Systems: As presented in the history of presenting illness, rest negative.  Where does patient live?  Lives with wife in [place] Can patient participate in ADLs? barely  Allergy: No Known Allergies  Past Medical History  Diagnosis Date  . Renal disorder   . Pacemaker   . Shortness of breath     "when I take one of my chemo pills" (03/17/2013)  . Protein calorie malnutrition   . Multiple myeloma   . Skin cancer of face     "once; left side of my face" (03/17/2013)  . Pneumonia 06/09/2013  . Diabetes mellitus without complication     no meds  . Arthritis     "little  in my right shoulder" (03/17/2013)  . History of blood transfusion   . Daily headache     denies  . Cataract     Past Surgical History  Procedure Laterality Date  . Appendectomy    . Kidney surgery Right 2000's    "cut out ~ 1/2"   . Insert / replace / remove pacemaker    . Bascilic vein transposition Right 07/10/2013    Procedure: BASILIC VEIN TRANSPOSITION- FIRST STAGE ;  Surgeon: Conrad Mowbray Mountain, MD;  Location: [facility];  Service: Vascular;  Laterality: Right;  . Insertion of dialysis catheter Right 08/28/2013    Procedure: INSERTION OF DIALYSIS CATHETER in Right Internal Jugular;  Surgeon: [name], MD;  Location: [facility];  Service: Vascular;  Laterality: Right;  . Bascilic vein transposition Right 09/01/2013    Procedure: BASILIC VEIN TRANSPOSITION - 2ND STAGE AVF CREATION;  Surgeon: [name], MD;  Location: [place];  Service: Vascular;  Laterality: Right;  . Av fistula placement      Social History:  reports that he has been smoking Cigarettes.  He has a 15 pack-year smoking history. He has never used smokeless tobacco. He reports that he does not drink alcohol or use illicit drugs.  Family History:  Family History  Problem Relation Age of Onset  . Diabetes Mellitus II Father      Prior to Admission medications   Medication Sig Start Date End Date Taking? Authorizing Provider  acyclovir (ZOVIRAX) 400 MG  tablet Take 400 mg by mouth daily.   Yes Historical Provider, MD  amiodarone (PACERONE) 200 MG tablet Take 200 mg by mouth daily.   Yes Historical Provider, MD  dexamethasone (DECADRON) 4 MG tablet Take 40 mg by mouth every 7 (seven) days. Every Tuesday.   Yes Historical Provider, MD  ferrous sulfate 325 (65 FE) MG tablet Take 325 mg by mouth daily with breakfast.   Yes Historical Provider, MD  HYDROcodone-acetaminophen (NORCO/VICODIN) 5-325 MG per tablet Take 0.25 tablets by mouth every 6 (six) hours as needed for moderate pain.   Yes Historical Provider, MD  ipratropium-albuterol  (DUONEB) 0.5-2.5 (3) MG/3ML SOLN Take 3 mLs by nebulization every 4 (four) hours as needed (for shortness of breath).   Yes Historical Provider, MD  megestrol (MEGACE) 40 MG/ML suspension Take 800 mg by mouth 4 (four) times daily.   Yes Historical Provider, MD  midodrine (PROAMATINE) 10 MG tablet Take 1 tablet (10 mg total) by mouth 3 (three) times daily with meals. 09/07/13  Yes Reyne Dumas, MD  tamsulosin (FLOMAX) 0.4 MG CAPS capsule Take 0.4 mg by mouth daily.   Yes Historical Provider, MD  ACCU-CHEK AVIVA PLUS test strip 1 each by Other route See admin instructions. Check blood sugar once daily. 05/31/13   Historical Provider, MD  ACCU-CHEK SOFTCLIX LANCETS lancets 1 each by Other route See admin instructions. Check blood sugar once daily. 05/31/13   Historical Provider, MD    Physical Exam: Filed Vitals:   11/25/13 0445 11/25/13 0500 11/25/13 0515 11/25/13 0530  BP: 118/80 123/61 118/58 106/65  Pulse: 75 73 74 74  Temp:      TempSrc:      Resp: 23 14 16 16   SpO2: 100% 97% 96% 96%   General: Not in acute distress HEENT: There is a bruise over his left face around orbital area.        Eyes: PERRL, EOMI, no scleral icterus       ENT: No discharge from the ears and nose, no pharynx injection, no tonsillar enlargement.        Neck: No JVD, no bruit, no mass felt. Cardiac: S1/S2, RRR, No murmurs, gallops or rubs Pulm: has diffused rales and rhonchi posteriorly. Abd: Soft, nondistended, nontender, no rebound pain, no organomegaly, BS present Ext: No edema. 2+DP/PT pulse bilaterally Musculoskeletal: No joint deformities, erythema, or stiffness, ROM full Skin: Stage IV pressure ulcer of sacrum  Neuro: Alert and oriented X3, cranial nerves II-XII grossly intact, muscle strength 5/5 in all extremeties, sensation to light touch intact.  Psych: Patient is not psychotic, no suicidal or hemocidal ideation.  Labs on Admission:  Basic Metabolic Panel:  Recent Labs Lab 11/25/13 0240  NA 143   K 4.2  CL 102  CO2 22  GLUCOSE 61*  BUN 22  CREATININE 2.92*  CALCIUM 8.3*   Liver Function Tests:  Recent Labs Lab 11/25/13 0240  AST 20  ALT 18  ALKPHOS 81  BILITOT 0.7  PROT 5.9*  ALBUMIN 2.1*   No results found for this basename: LIPASE, AMYLASE,  in the last 168 hours No results found for this basename: AMMONIA,  in the last 168 hours CBC:  Recent Labs Lab 11/25/13 0240  WBC 26.5*  NEUTROABS 25.2*  HGB 13.1  HCT 41.9  MCV 90.5  PLT 135*   Cardiac Enzymes:  Recent Labs Lab 11/25/13 0328  CKTOTAL 31    BNP (last 3 results)  Recent Labs  06/08/13 2145 11/04/13 2328  PROBNP  16467.0* 13759.0*   CBG: No results found for this basename: GLUCAP,  in the last 168 hours  Radiological Exams on Admission: Dg Chest Port 1 View  11/25/2013   CLINICAL DATA:  Code sepsis.  Fever.  EXAM: PORTABLE CHEST - 1 VIEW  COMPARISON:  Chest radiograph performed 11/04/2013  FINDINGS: The lungs are well-aerated. Retrocardiac airspace opacity raises concern for pneumonia, given the patient's symptoms. There is no evidence of pleural effusion or pneumothorax.  The cardiomediastinal silhouette is borderline normal in size. A pacemaker is noted overlying the left chest wall, with leads ending overlying the right atrium and right ventricle. A right-sided dual-lumen catheter is noted ending about the cavoatrial junction. No acute osseous abnormalities are seen.  IMPRESSION: Retrocardiac airspace opacification raises concern for pneumonia.   Electronically Signed   By: Garald Balding M.D.   On: 11/25/2013 04:10   Assessment/Plan Principal Problem:   HCAP (healthcare-associated pneumonia) Active Problems:   Multiple myeloma   Pacemaker   End stage renal disease   Severe sepsis   Decubitus ulcer of buttock, stage 4  1. Severe sepsis secondary to HCAP: Patient presents with leukocytosis, fever and cough. Chest x-ray is also consistent with pneumonia. Given patient is a dialysis  patient, HCAP would be an appropriate diagnosis. Patient is in severe sepsis with a blood pressure drop to SBP 70. His Bp responded to IV fluid. He received vancomycin, Zosyn and levofloxacin in Ed. Given his immunocompromised condition 2/2 to multiple myeloma, I will switch to IV vancomycin and cefepime. Patient also has sacral decubitus, does not look like to have a severe infection on examination. Currently blood pressure is 123/61 after 2 L of normal saline. Lactic acid down from 7.96 to 3.27.   - will admit to SDU - IV cefepime and vancomycin - Blood culture times 2 - Continue IV fluid, normal saline 125cc per hour. Need to watch volume status closely given his CHF and ESRD-HD - will get urine legionella and S. pneumococcal antigen - will treat with albuterol inhaler - will give Zofran for Nausea - CBC, CMP - will get EKG - repeat Lactic acid at 10:00 AM - PT/OT  2. ESRD-HD: M/W/F. He completed full course of HD on Friday.  - left message to renal for HD routine consult .  3. Multiple myeloma: continue home dexamethasone   4. Stage IV pressure ulcer of sacrum:  - consult wound care   DVT ppx: SQ Heparin  Code Status: Full code Family Communication:  Yes, patient's wife at bed side Disposition Plan: Admit to inpatient  Addendum:  Given that patient is chronically on steroid and possible adrenal suppression, will give stress dose of Cortef 50 mg q6h and check cortisol level.    Ivor Costa [facility] Pager [phone]  If 7PM-7AM, please contact night-coverage [email or web address] Password [facility] 11/25/2013, 5:49 AM

## 2013-11-25 NOTE — ED Notes (Signed)
Dialysis cath rt upper chest

## 2013-11-25 NOTE — ED Notes (Signed)
The pt fell onto the floor when  He was attempting to use the urinal.   Bruise to the lt side of his face.  Alert oriented..  Dialysis pt  Rt arm fistula.  Last dialytsis was yesterday.  hed had a portion of a pain pill 0030 tonight.

## 2013-11-25 NOTE — Progress Notes (Signed)
Pt seen and examined at bedsdide. Please see earlier note by Dr. Blaine Hamper, this is the addendum to the earlier admission note.  Brief HPI: Pt is 73 yo male with ESRD on HD MWF, MM, DM type II, presented with weakness, body aching, cough productive of yellow sputum, fevers, chills, several day sin duration but worse over the past 24 hours. He recently suffered a fall and has a bruise over the left facial area. In ED, patient was found to have leukocytosis with WBC 26.5. X-ray showed retrocardiac airspace opacification concerning for pneumonia. IV antibiotics were started. Patient was admitted to step down unit.  Physical Exam  Constitutional: Appears calm and NAD HENT: Normocephalic. External right and left ear normal. Bruise over the left face CVS: RRR, S1/S2 +, no gallops, no carotid bruit.  Pulmonary: Diffuse rales bilaterally, diminished breath sounds at bases  Abdominal: Soft. BS +,  no distension, tenderness, rebound or guarding.   Assessment and plan: Severe sepsis secondary to HCAP:  - continue to monitor closely in SDU - pt reports feeling better this AM - continue cefepime and vancomycin day #2 - Blood cultures x 2 pending  - provide oxygen as needed, BD's ESRD-HD  - appreciate nephrology team input - pt has completed HD Friday  - not volume overloaded on exam but will hold off on IVF  - diet advanced  Multiple myeloma - continue home dexamethasone  Stage IV pressure ulcer of sacrum:  - consult wound care   DVT ppx: SQ Heparin   Scott Ramsay, MD  [facility] Pager [phone]  If 7PM-7AM, please contact night-coverage [email or web address] Password [email or web address]

## 2013-11-25 NOTE — Consult Note (Signed)
Renal Service Consult Note [facility]  NADIA TORR 11/25/2013 Roney Jaffe D Requesting Physician:  Dr Doyle Askew   Reason for Consult:  ESRD patient HPI: The patient is a 73 y.o. year-old with hx myeloma, ESRD on HD since June.  Pt says the night before last he fell in the [place] and hit his R face. Then last night he felt very weak, his knees were aching severely and he was SOB and then came to the ER.  BP was low and he rec'd IVF's and was admitted.  He is feeling a little better today. He denies any recent fevers, chills, rigors, prod sputum, dysuria or abd pain.  No vomiting or diarrhea.  Did not miss any HD this week.   He was dx'd with MM in Dec 2014.  He took 3 medications but had side effects.  One of the medications, a pill taken 7 days then 7d off was stopped in Feb.  Another medication, injectable once weekly , was stopped in June according to pts wife for side effects. Now he is taking just decadron 10 pills once every other week.    UA showed 3-6 wbc, TNTC rbc's.  Small LE WBC 26.5k, Hb 13, plt 135, temp 100.3 INR 1.39 CXR retrocardiac airspace disease suggesting PNA  Home meds > zovirax, amio, decadron, feso4, vicodin, duoneb, megace, midodrine, flomax   Chart review: 01/15 - multiple myeloma, acute on CRF. Started chemo in Dec 2014.  He was weak. He got IVF and creat reduced to 3.69.  PT saw pt and recommended home health w PT.  03/15 - myeloma, CKD baseline creat 3.5 w cough, fever, SOB > HCAP rx with IV AB, myeloma on decadron, CKD stage IV stable, severe PC malnutrition 06/15 - acute on CKD 4, started on hemodialysis.  Cdif+ rx with po vanc.  Hypoxic resp failure with pulm edema vs PNA rx with IV AB and HD.  Required temp bipap. AB dc'd 6/22 to help with Cdif.  Midodrine started for hypotension in setting or ESRD.  CLIP'd to [place] [facility] TTS.  1st stage BVT fistula done 4/27 08/15 - fever, chills, weakness, low BP in ED > severe sepsis rx w IV AB. Urine cx  +Ecoli 40K, BCx's neg. Dc'd on po abx.  CXR clear. ESRD on HD tiw.  Myeloma on weekly decadron, ?other. 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 DC'd to home.    ROS  no jt pain  no HA  no n/v/d  no rash or itching  Past Medical History  Past Medical History  Diagnosis Date  . Renal disorder   . Pacemaker   . Shortness of breath     "when I take one of my chemo pills" (03/17/2013)  . Protein calorie malnutrition   . Multiple myeloma   . Skin cancer of face     "once; left side of my face" (03/17/2013)  . Pneumonia 06/09/2013  . Diabetes mellitus without complication     no meds  . Arthritis     "little in my right shoulder" (03/17/2013)  . History of blood transfusion   . Daily headache     denies  . Cataract    Past Surgical History  Past Surgical History  Procedure Laterality Date  . Appendectomy    . Kidney surgery Right 2000's    "cut out ~ 1/2"   . Insert / replace / remove pacemaker    . Bascilic vein transposition Right  07/10/2013    Procedure: BASILIC VEIN TRANSPOSITION- FIRST STAGE ;  Surgeon: Conrad Wisner, MD;  Location: [place];  Service: Vascular;  Laterality: Right;  . Insertion of dialysis catheter Right 08/28/2013    Procedure: INSERTION OF DIALYSIS CATHETER in Right Internal Jugular;  Surgeon: [name], MD;  Location: [place];  Service: Vascular;  Laterality: Right;  . Bascilic vein transposition Right 09/01/2013    Procedure: BASILIC VEIN TRANSPOSITION - 2ND STAGE AVF CREATION;  Surgeon: [name], MD;  Location: [place];  Service: Vascular;  Laterality: Right;  . Av fistula placement     Family History  Family History  Problem Relation Age of Onset  . Diabetes Mellitus II Father    Social History  reports that he has been smoking Cigarettes.  He has a 15 pack-year smoking history. He has never used smokeless tobacco. He reports that he does not drink alcohol or use illicit drugs. Allergies No Known Allergies Home medications Prior to  Admission medications   Medication Sig Start Date End Date Taking? Authorizing Provider  acyclovir (ZOVIRAX) 400 MG tablet Take 400 mg by mouth daily.   Yes Historical Provider, MD  amiodarone (PACERONE) 200 MG tablet Take 200 mg by mouth daily.   Yes Historical Provider, MD  dexamethasone (DECADRON) 4 MG tablet Take 40 mg by mouth every 7 (seven) days. Every Tuesday.   Yes Historical Provider, MD  ferrous sulfate 325 (65 FE) MG tablet Take 325 mg by mouth daily with breakfast.   Yes Historical Provider, MD  HYDROcodone-acetaminophen (NORCO/VICODIN) 5-325 MG per tablet Take 0.25 tablets by mouth every 6 (six) hours as needed for moderate pain.   Yes Historical Provider, MD  ipratropium-albuterol (DUONEB) 0.5-2.5 (3) MG/3ML SOLN Take 3 mLs by nebulization every 4 (four) hours as needed (for shortness of breath).   Yes Historical Provider, MD  megestrol (MEGACE) 40 MG/ML suspension Take 800 mg by mouth 4 (four) times daily.   Yes Historical Provider, MD  midodrine (PROAMATINE) 10 MG tablet Take 1 tablet (10 mg total) by mouth 3 (three) times daily with meals. 09/07/13  Yes Reyne Dumas, MD  tamsulosin (FLOMAX) 0.4 MG CAPS capsule Take 0.4 mg by mouth daily.   Yes Historical Provider, MD  ACCU-CHEK AVIVA PLUS test strip 1 each by Other route See admin instructions. Check blood sugar once daily. 05/31/13   Historical Provider, MD  ACCU-CHEK SOFTCLIX LANCETS lancets 1 each by Other route See admin instructions. Check blood sugar once daily. 05/31/13   Historical Provider, MD   Liver Function Tests  Recent Labs Lab 11/25/13 0240  AST 20  ALT 18  ALKPHOS 81  BILITOT 0.7  PROT 5.9*  ALBUMIN 2.1*   No results found for this basename: LIPASE, AMYLASE,  in the last 168 hours CBC  Recent Labs Lab 11/25/13 0240  WBC 26.5*  NEUTROABS 25.2*  HGB 13.1  HCT 41.9  MCV 90.5  PLT 616*   Basic Metabolic Panel  Recent Labs Lab 11/25/13 0240  NA 143  K 4.2  CL 102  CO2 22  GLUCOSE 61*  BUN 22   CREATININE 2.92*  CALCIUM 8.3*    Filed Vitals:   11/25/13 0729 11/25/13 0732 11/25/13 1145 11/25/13 1205  BP:  129/61 131/61   Pulse:  80 66   Temp: 98.1 F (36.7 C)   98.4 F (36.9 C)  TempSrc: Oral   Oral  Resp:  19 18   Height: 5' 5"  (1.651 m)  Weight: 66.7 kg (147 lb 0.8 oz)     SpO2:  97% 99%    Exam Older adult male, large ecchymosis L eye No rash, cyanosis or gangrene Sclera anicteric, throat clear No jvd or bruits Chest clear bilat, occ rhonchi, no rales or wheezing RRR faint SEM no RG Abd soft, NTND, +BS no ascites or HSM R chest IJ cath clean exit site RUA AV fistula patent, immature  HD: MWF [facility] 4h  400/A1.5  61kg  3K/2.25 [place]  Heparin 2000   R IJ cath (maturing AVF RUA) Aranesp 120 ug weekly   Assessment: 1 HCAP - on IV abx 2 ESRD on HD 3 Sacral decub- advanced, wound VAC in place 4 Multiple myeloma - on decadron now only 5 Anemia on aranesp 6 HPTH check phos 7 HTN/vol- no vol excess on exam, up by wts but prob not accurate   Plan- HD Monday  Kelly Splinter MD (pgr) [phone]    ([phone] 11/25/2013, 1:14 PM

## 2013-11-25 NOTE — Progress Notes (Signed)
ANTIBIOTIC CONSULT NOTE - INITIAL  Pharmacy Consult for Vancomycin and Cefepime Indication: pneumonia  No Known Allergies  Patient Measurements: Height: 5' 5"  (165.1 cm) Weight: 147 lb 0.8 oz (66.7 kg) IBW/kg (Calculated) : 61.5  Vital Signs: Temp: 98.1 F (36.7 C) (09/12 0729) Temp src: Oral (09/12 0729) BP: 129/61 mmHg (09/12 0732) Pulse Rate: 80 (09/12 0732) Intake/Output from previous day: 09/11 0701 - 09/12 0700 In: 2050 [I.V.:2050] Out: -  Intake/Output from this shift: Total I/O In: 250 [I.V.:250] Out: -   Labs:  Recent Labs  11/25/13 0240  WBC 26.5*  HGB 13.1  PLT 135*  CREATININE 2.92*   Estimated Creatinine Clearance: 20.2 ml/min (by C-G formula based on Cr of 2.92). No results found for this basename: VANCOTROUGH, Corlis Leak, VANCORANDOM, GENTTROUGH, GENTPEAK, GENTRANDOM, [place], TOBRAPEAK, TOBRARND, AMIKACINPEAK, AMIKACINTROU, AMIKACIN,  in the last 72 hours   Microbiology: Recent Results (from the past 720 hour(s))  CULTURE, BLOOD (ROUTINE X 2)     Status: None   Collection Time    11/04/13 11:28 PM      Result Value Ref Range Status   Specimen Description BLOOD LEFT ANTECUBITAL   Final   Special Requests BOTTLES DRAWN AEROBIC AND ANAEROBIC [place] EA   Final   Culture  Setup Time     Final   Value: 11/05/2013 13:51     Performed at [facility]   Culture     Final   Value: NO GROWTH 5 DAYS     Performed at [facility]   Report Status 11/11/2013 FINAL   Final  CULTURE, BLOOD (ROUTINE X 2)     Status: None   Collection Time    11/04/13 11:32 PM      Result Value Ref Range Status   Specimen Description BLOOD LEFT HAND   Final   Special Requests BOTTLES DRAWN AEROBIC ONLY 2CC   Final   Culture  Setup Time     Final   Value: 11/05/2013 13:51     Performed at [facility]   Culture     Final   Value: NO GROWTH 5 DAYS     Performed at [facility]   Report Status 11/11/2013 FINAL   Final  MRSA PCR SCREENING      Status: Abnormal   Collection Time    11/05/13  4:01 AM      Result Value Ref Range Status   MRSA by PCR POSITIVE (*) NEGATIVE Final   Comment:            The GeneXpert MRSA Assay (FDA     approved for NASAL specimens     only), is one component of a     comprehensive MRSA colonization     surveillance program. It is not     intended to diagnose MRSA     infection nor to guide or     monitor treatment for     MRSA infections.     RESULT CALLED TO, READ BACK BY AND VERIFIED WITH:     DUFFY,S RN [id number] 11/05/13 MITCHELL,L  URINE CULTURE     Status: None   Collection Time    11/05/13  6:25 PM      Result Value Ref Range Status   Specimen Description URINE, RANDOM   Final   Special Requests Immunocompromised   Final   Culture  Setup Time     Final   Value: 11/06/2013 02:05     Performed at [facility]   Colony Count     Final   Value: 40,000 COLONIES/ML     Performed at [facility]   Culture     Final   Value: ESCHERICHIA COLI     Performed at [facility]   Report Status 11/07/2013 FINAL   Final   Organism ID, Bacteria ESCHERICHIA COLI   Final    Medical History: Past Medical History  Diagnosis Date  . Renal disorder   . Pacemaker   . Shortness of breath     "when I take one of my chemo pills" (03/17/2013)  . Protein calorie malnutrition   . Multiple myeloma   . Skin cancer of face     "once; left side of my face" (03/17/2013)  . Pneumonia 06/09/2013  . Diabetes mellitus without complication     no meds  . Arthritis     "little in my right shoulder" (03/17/2013)  . History of blood transfusion   . Daily headache     denies  . Cataract     Assessment: 73 yo male who presented with weakness, fever and chills, chronic cough, and yellow-colored sputum. HCAP diagnosis and pharmacy consulted to initiate vancomycin. WBC of 26.5, Tmax 100.3 but currently afebrile. Patient has ESRD on HD MWF.  One dose of cefepime 1g Q24h given on 9/12. Given ESRD,  cefepime should be dosed 2 g IV qHD, change OKed by Dr. Doyle Askew. Loading and maintenance vanc dosed per HD. BCx x2 drawn on 9/12.  Goal of Therapy:  Vancomycin trough level 15-20 mcg/ml Resolution of symptoms  Plan:  Change cefepime to 2g IV qHD (MWF) Load vancomycin 1500 mg IV x1 Maintenance vancomycin 750 mg IV post-HD  Measure antibiotic drug levels at steady state Follow up culture results to narrow therapy when possible.  Caroline Longie E. Matty Vanroekel, Pharm.D Clinical Pharmacy Resident Pager: [phone] 11/25/2013 9:55 AM

## 2013-11-25 NOTE — ED Notes (Signed)
Dr. Oni at bedside. 

## 2013-11-25 NOTE — ED Notes (Signed)
This RN called flow manager to check on status of bed assignment and was told this patient will be sent to [facility].

## 2013-11-25 NOTE — ED Provider Notes (Signed)
CSN: [id number]     Arrival date & time 11/25/13  0205 History   First MD Initiated Contact with Patient 11/25/13 0240     Chief Complaint  Patient presents with  . Fall     (Consider location/radiation/quality/duration/timing/severity/associated sxs/prior Treatment) HPI  Scott [name] is a 73 yo male with a PMH of ESRD, DM, PCM, presenting today for diffuse weakness and pain in his lower extremities.  He had a dialysis session today without complications.  Per the wife in the room, he has been having chills as well.  Last time this occurred he was hospitalized with pneumonia in Dec 2014.  He denies cough, chest pain, or SOB.  He has a wound vac on his sacrum for a wound developed during a bout of C diff, leaving his skin excoriated.  He has no abdominal pain, or changes in his urine or bowel movements.  Family denies sick contacts.  10 Systems reviewed and are negative for acute change except as noted in the HPI.    Past Medical History  Diagnosis Date  . Renal disorder   . Pacemaker   . Shortness of breath     "when I take one of my chemo pills" (03/17/2013)  . Protein calorie malnutrition   . Multiple myeloma   . Skin cancer of face     "once; left side of my face" (03/17/2013)  . Pneumonia 06/09/2013  . Diabetes mellitus without complication     no meds  . Arthritis     "little in my right shoulder" (03/17/2013)  . History of blood transfusion   . Daily headache     denies  . Cataract    Past Surgical History  Procedure Laterality Date  . Appendectomy    . Kidney surgery Right 2000's    "cut out ~ 1/2"   . Insert / replace / remove pacemaker    . Bascilic vein transposition Right 07/10/2013    Procedure: BASILIC VEIN TRANSPOSITION- FIRST STAGE ;  Surgeon: Conrad Choteau, MD;  Location: [place];  Service: Vascular;  Laterality: Right;  . Insertion of dialysis catheter Right 08/28/2013    Procedure: INSERTION OF DIALYSIS CATHETER in Right Internal Jugular;  Surgeon: [name], MD;   Location: [place];  Service: Vascular;  Laterality: Right;  . Bascilic vein transposition Right 09/01/2013    Procedure: BASILIC VEIN TRANSPOSITION - 2ND STAGE AVF CREATION;  Surgeon: [name], MD;  Location: [place];  Service: Vascular;  Laterality: Right;  . Av fistula placement     Family History  Problem Relation Age of Onset  . Diabetes Mellitus II Father    History  Substance Use Topics  . Smoking status: Light Tobacco Smoker -- 0.25 packs/day for 60 years    Types: Cigarettes  . Smokeless tobacco: Never Used     Comment: 03/17/2013 "smoked a cigarette yesterday; sometimes none"  . Alcohol Use: No    Review of Systems    Allergies  Review of patient's allergies indicates no known allergies.  Home Medications   Prior to Admission medications   Medication Sig Start Date End Date Taking? Authorizing Provider  ACCU-CHEK AVIVA PLUS test strip 1 each by Other route See admin instructions. Check blood sugar once daily. 05/31/13   Historical Provider, MD  ACCU-CHEK SOFTCLIX LANCETS lancets 1 each by Other route See admin instructions. Check blood sugar once daily. 05/31/13   Historical Provider, MD  acyclovir (ZOVIRAX) 400 MG tablet Take 400 mg by  mouth daily.    Historical Provider, MD  amiodarone (PACERONE) 200 MG tablet Take 200 mg by mouth daily.    Historical Provider, MD  cefpodoxime Bennie Pierini) 200 MG tablet Take 1 tablet (200 mg total) by mouth every Monday, Wednesday, and Friday at 6 PM. Starting 8/28 for 4 doses. 11/08/13   Bonnielee Haff, MD  dexamethasone (DECADRON) 4 MG tablet Take 40 mg by mouth every 7 (seven) days. Every Tuesday.    Historical Provider, MD  ferrous sulfate 325 (65 FE) MG tablet Take 325 mg by mouth daily with breakfast.    Historical Provider, MD  ibuprofen (ADVIL,MOTRIN) 200 MG tablet Take 200 mg by mouth every 6 (six) hours as needed.    Historical Provider, MD  ipratropium-albuterol (DUONEB) 0.5-2.5 (3) MG/3ML SOLN Take 3 mLs by nebulization every 4  (four) hours as needed (for shortness of breath).    Historical Provider, MD  megestrol (MEGACE) 40 MG/ML suspension Take 800 mg by mouth daily.    Historical Provider, MD  Menthol-Methyl Salicylate (MUSCLE RUB EX) Apply 1 application topically as needed. For muscle cramps 07/24/13   Historical Provider, MD  midodrine (PROAMATINE) 10 MG tablet Take 1 tablet (10 mg total) by mouth 3 (three) times daily with meals. 09/07/13   Reyne Dumas, MD  traMADol (ULTRAM) 50 MG tablet Take 1 tablet (50 mg total) by mouth every 6 (six) hours as needed for moderate pain. 09/07/13   Reyne Dumas, MD   BP 78/45  Pulse 87  Temp(Src) 100.3 F (37.9 C) (Rectal)  Resp 20  SpO2 96% Physical Exam  Nursing note and vitals reviewed. Constitutional: He is oriented to person, place, and time. Vital signs are normal. He appears well-developed and well-nourished.  Non-toxic appearance. He does not appear ill. No distress.  HENT:  Head: Normocephalic and atraumatic.  Nose: Nose normal.  Mouth/Throat: Oropharynx is clear and moist. No oropharyngeal exudate.  Eyes: Conjunctivae and EOM are normal. Pupils are equal, round, and reactive to light. No scleral icterus.  Neck: Normal range of motion. Neck supple. No tracheal deviation, no edema, no erythema and normal range of motion present. No mass and no thyromegaly present.  R dialysis line is C/D/I, no erythema, warmth, drainage or tenderness to palpation.  Cardiovascular: Normal rate, regular rhythm, S1 normal, S2 normal, normal heart sounds, intact distal pulses and normal pulses.  Exam reveals no gallop and no friction rub.   No murmur heard. Pulses:      Radial pulses are 2+ on the right side, and 2+ on the left side.       Dorsalis pedis pulses are 2+ on the right side, and 2+ on the left side.  Pulmonary/Chest: Effort normal and breath sounds normal. No respiratory distress. He has no wheezes. He has no rhonchi. He has no rales.  Abdominal: Soft. Normal appearance and  bowel sounds are normal. He exhibits no distension, no ascites and no mass. There is no hepatosplenomegaly. There is no tenderness. There is no rebound, no guarding and no CVA tenderness.  Genitourinary:  Wound vac in place.  Rectal temp 100.3  Musculoskeletal: Normal range of motion. He exhibits no edema and no tenderness.  R AV fistula with palpable thrill  Lymphadenopathy:    He has no cervical adenopathy.  Neurological: He is alert and oriented to person, place, and time. He has normal strength. No cranial nerve deficit or sensory deficit. GCS eye subscore is 4. GCS verbal subscore is 5. GCS motor subscore is 6.  Skin: Skin is warm, dry and intact. No petechiae and no rash noted. He is not diaphoretic. No erythema. No pallor.  Psychiatric: He has a normal mood and affect. His behavior is normal. Judgment normal.    ED Course  Procedures (including critical care time) Labs Review Labs Reviewed  CBC WITH DIFFERENTIAL - Abnormal; Notable for the following:    WBC 26.5 (*)    RDW 20.7 (*)    Platelets 135 (*)    Neutrophils Relative % 95 (*)    Lymphocytes Relative 2 (*)    Neutro Abs 25.2 (*)    Lymphs Abs 0.5 (*)    All other components within normal limits  COMPREHENSIVE METABOLIC PANEL - Abnormal; Notable for the following:    Glucose, Bld 61 (*)    Creatinine, Ser 2.92 (*)    Calcium 8.3 (*)    Total Protein 5.9 (*)    Albumin 2.1 (*)    GFR calc non Af Amer 20 (*)    GFR calc Af Amer 23 (*)    Anion gap 19 (*)    All other components within normal limits  URINALYSIS, ROUTINE W REFLEX MICROSCOPIC - Abnormal; Notable for the following:    Color, Urine RED (*)    APPearance CLOUDY (*)    pH 8.5 (*)    Hgb urine dipstick LARGE (*)    Bilirubin Urine SMALL (*)    Ketones, ur 15 (*)    Protein, ur 100 (*)    Leukocytes, UA SMALL (*)    All other components within normal limits  PROTIME-INR - Abnormal; Notable for the following:    Prothrombin Time 17.1 (*)    All other  components within normal limits  GLUCOSE, CAPILLARY - Abnormal; Notable for the following:    Glucose-Capillary 111 (*)    All other components within normal limits  I-STAT CG4 LACTIC ACID, ED - Abnormal; Notable for the following:    Lactic Acid, Venous 7.96 (*)    All other components within normal limits  I-STAT CG4 LACTIC ACID, ED - Abnormal; Notable for the following:    Lactic Acid, Venous 3.27 (*)    All other components within normal limits  CULTURE, BLOOD (ROUTINE X 2)  CULTURE, BLOOD (ROUTINE X 2)  URINE CULTURE  CULTURE, BLOOD (ROUTINE X 2)  CULTURE, BLOOD (ROUTINE X 2)  CULTURE, EXPECTORATED SPUTUM-ASSESSMENT  GRAM STAIN  CK  STREP PNEUMONIAE URINARY ANTIGEN  LACTIC ACID, PLASMA  GLUCOSE, CAPILLARY  URINE MICROSCOPIC-ADD ON  GLUCOSE, CAPILLARY  LEGIONELLA ANTIGEN, URINE  CORTISOL  PHOSPHORUS  COMPREHENSIVE METABOLIC PANEL  CBC WITH DIFFERENTIAL  I-STAT TROPOININ, ED  I-STAT CG4 LACTIC ACID, ED    Imaging Review Dg Chest Port 1 View  11/25/2013   CLINICAL DATA:  Code sepsis.  Fever.  EXAM: PORTABLE CHEST - 1 VIEW  COMPARISON:  Chest radiograph performed 11/04/2013  FINDINGS: The lungs are well-aerated. Retrocardiac airspace opacity raises concern for pneumonia, given the patient's symptoms. There is no evidence of pleural effusion or pneumothorax.  The cardiomediastinal silhouette is borderline normal in size. A pacemaker is noted overlying the left chest wall, with leads ending overlying the right atrium and right ventricle. A right-sided dual-lumen catheter is noted ending about the cavoatrial junction. No acute osseous abnormalities are seen.  IMPRESSION: Retrocardiac airspace opacification raises concern for pneumonia.   Electronically Signed   By: Garald Balding M.D.   On: 11/25/2013 04:10     EKG Interpretation   Date/Time:  Saturday November 25 2013 02:38:50 EDT Ventricular Rate:  77 PR Interval:  89 QRS Duration: 120 QT Interval:  447 QTC Calculation:  506 R Axis:   -94 Text Interpretation:  Electronic ventricular pacemaker Left anterior  fascicular block Anterolateral infarct, age indeterminate No significant  change since last tracing Confirmed by Glynn Octave [phone]) on  11/25/2013 5:44:28 PM      MDM   Final diagnoses:  None    I was called into the room upon patients arrival for SBP in the 70s.  Along with the history and rectal temperature of 100.3, code sepsis was called.  Patient received 2L IVF, vanc, zosyn, levoquin for pneumonia.  SBP was in the 110s at this point.  Admission was made to the step down unit for continued care.  Lactate improved from about 8 to 3.5.  WBC is 26.  Nephrology was paged for inpatient consultation and diureses after IVF resuscitation.     CRITICAL CARE Performed by: Everlene Balls   Total critical care time: 9mn  Critical care time was exclusive of separately billable procedures and treating other patients.  Critical care was necessary to treat or prevent imminent or life-threatening deterioration.  Critical care was time spent personally by me on the following activities: development of treatment plan with patient and/or surrogate as well as nursing, discussions with consultants, evaluation of patient's response to treatment, examination of patient, obtaining history from patient or surrogate, ordering and performing treatments and interventions, ordering and review of laboratory studies, ordering and review of radiographic studies, pulse oximetry and re-evaluation of patient's condition.   AEverlene Balls MD 11/25/13 1750

## 2013-11-25 NOTE — Consult Note (Signed)
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  Pt admitted with Medella machine and dressing intact. Discussed plan of care with patient and wife at bedside.  It is hospital policy to apply our machine and Vac therapy when patients are admitted.  They verbalize understanding.  Informed wife she can bring Grimes device home and it can be resumed after discharge. Wound type: Chronic stage 4  To sacrum Pressure Ulcer POA: Yes Measurement:2X1X1cm with undermining to 1 cm at 12:00 o'clock and 6:00 o'clock. Wound bed: 15% yellow, 85% red, bone palpable with swab Drainage (amount, consistency, odor) Small amt yellow drainage in cannister Periwound: Red and macerated peri wound area; appearance consistent with medical adhesive related skin injury and candidiasis.  Left this site open to air. Dressing procedure/placement/frequency: Applied barrier ring to maintain seal near buttocks and one piece black foam to 165mm cont suction.  Pt tolerated without c/o pain.  Plan dressing change Tues/Thurs/Sat. If patient discharged from hospital, then KCI vac and dressing can be discontinued and moist gauze packing can be applied until home health can reapply the other negative pressure device after discharge. Julien Girt MSN, RN, [place], [place], [place]

## 2013-11-25 NOTE — Progress Notes (Signed)
PT Cancellation Note  Patient Details Name: Scott [name] MRN: [id number] DOB: 07-03-1940   Cancelled Treatment:    Reason Eval/Treat Not Completed: Patient not medically ready. Active bedrest orders.   Duncan Dull 11/25/2013, 11:58 AM Alben Deeds, PT DPT  [phone]

## 2013-11-25 NOTE — ED Notes (Signed)
Pts BP is 82/38, Dr. Claudine Mouton at bedside. Verbal order to adminsiter 2L of Normal Saline instead of the 1672ml of Normal Saline previously ordered.

## 2013-11-26 LAB — COMPREHENSIVE METABOLIC PANEL
ALT: 13 U/L (ref 0–53)
ANION GAP: 15 (ref 5–15)
AST: 18 U/L (ref 0–37)
Albumin: 1.7 g/dL — ABNORMAL LOW (ref 3.5–5.2)
Alkaline Phosphatase: 70 U/L (ref 39–117)
BILIRUBIN TOTAL: 0.5 mg/dL (ref 0.3–1.2)
BUN: 35 mg/dL — AB (ref 6–23)
CO2: 19 mEq/L (ref 19–32)
Calcium: 7.8 mg/dL — ABNORMAL LOW (ref 8.4–10.5)
Chloride: 106 mEq/L (ref 96–112)
Creatinine, Ser: 3.64 mg/dL — ABNORMAL HIGH (ref 0.50–1.35)
GFR calc Af Amer: 18 mL/min — ABNORMAL LOW (ref >90)
GFR calc non Af Amer: 15 mL/min — ABNORMAL LOW (ref >90)
GLUCOSE: 95 mg/dL (ref 70–99)
Potassium: 3.6 mEq/L — ABNORMAL LOW (ref 3.7–5.3)
Sodium: 140 mEq/L (ref 137–147)
TOTAL PROTEIN: 4.8 g/dL — AB (ref 6.0–8.3)

## 2013-11-26 LAB — GLUCOSE, CAPILLARY: Glucose-Capillary: 88 mg/dL (ref 70–99)

## 2013-11-26 LAB — URINE CULTURE

## 2013-11-26 LAB — LEGIONELLA ANTIGEN, URINE: Legionella Antigen, Urine: NEGATIVE

## 2013-11-26 LAB — CBC WITH DIFFERENTIAL/PLATELET
BASOS ABS: 0 10*3/uL (ref 0.0–0.1)
BASOS PCT: 0 % (ref 0–1)
EOS ABS: 0 10*3/uL (ref 0.0–0.7)
Eosinophils Relative: 0 % (ref 0–5)
HCT: 34.6 % — ABNORMAL LOW (ref 39.0–52.0)
HEMOGLOBIN: 10.7 g/dL — AB (ref 13.0–17.0)
Lymphocytes Relative: 3 % — ABNORMAL LOW (ref 12–46)
Lymphs Abs: 0.5 10*3/uL — ABNORMAL LOW (ref 0.7–4.0)
MCH: 27.9 pg (ref 26.0–34.0)
MCHC: 30.9 g/dL (ref 30.0–36.0)
MCV: 90.1 fL (ref 78.0–100.0)
Monocytes Absolute: 0.5 10*3/uL (ref 0.1–1.0)
Monocytes Relative: 3 % (ref 3–12)
NEUTROS ABS: 15.1 10*3/uL — AB (ref 1.7–7.7)
NEUTROS PCT: 94 % — AB (ref 43–77)
Platelets: 149 10*3/uL — ABNORMAL LOW (ref 150–400)
RBC: 3.84 MIL/uL — ABNORMAL LOW (ref 4.22–5.81)
RDW: 20.6 % — ABNORMAL HIGH (ref 11.5–15.5)
WBC: 16.1 10*3/uL — ABNORMAL HIGH (ref 4.0–10.5)

## 2013-11-26 MED ORDER — POLYVINYL ALCOHOL 1.4 % OP SOLN
1.0000 [drp] | OPHTHALMIC | Status: DC | PRN
  Filled 2013-11-26: qty 15

## 2013-11-26 MED ORDER — LEVOFLOXACIN 500 MG PO TABS: 500.0000 mg | ORAL_TABLET | Freq: Every day | ORAL | Status: DC

## 2013-11-26 NOTE — Discharge Summary (Signed)
Physician Discharge Summary  Scott [name] MRN: [id number] DOB/AGE: May 21, 1940 73 y.o.  PCP: No primary provider on file.   Admit date: 11/25/2013 Discharge date: 11/26/2013  Discharge Diagnoses:  Patient left AGAINST MEDICAL ADVICE   HCAP (healthcare-associated pneumonia) Active Problems:   Multiple myeloma   Pacemaker   End stage renal disease   Severe sepsis   Decubitus ulcer of buttock, stage 4     Medication List         ACCU-CHEK AVIVA PLUS test strip  Generic drug:  glucose blood  1 each by Other route See admin instructions. Check blood sugar once daily.     ACCU-CHEK SOFTCLIX LANCETS lancets  1 each by Other route See admin instructions. Check blood sugar once daily.     acyclovir 400 MG tablet  Commonly known as:  ZOVIRAX  Take 400 mg by mouth daily.     amiodarone 200 MG tablet  Commonly known as:  PACERONE  Take 200 mg by mouth daily.     dexamethasone 4 MG tablet  Commonly known as:  DECADRON  Take 40 mg by mouth every 7 (seven) days. Every Tuesday.     ferrous sulfate 325 (65 FE) MG tablet  Take 325 mg by mouth daily with breakfast.     HYDROcodone-acetaminophen 5-325 MG per tablet  Commonly known as:  NORCO/VICODIN  Take 0.25 tablets by mouth every 6 (six) hours as needed for moderate pain.     ipratropium-albuterol 0.5-2.5 (3) MG/3ML Soln  Commonly known as:  DUONEB  Take 3 mLs by nebulization every 4 (four) hours as needed (for shortness of breath).     levofloxacin 500 MG tablet  Commonly known as:  LEVAQUIN  Take 1 tablet (500 mg total) by mouth daily.     megestrol 40 MG/ML suspension  Commonly known as:  MEGACE  Take 800 mg by mouth 4 (four) times daily.     midodrine 10 MG tablet  Commonly known as:  PROAMATINE  Take 1 tablet (10 mg total) by mouth 3 (three) times daily with meals.     tamsulosin 0.4 MG Caps capsule  Commonly known as:  FLOMAX  Take 0.4 mg by mouth daily.        Discharge Condition:  Stable Disposition: 01-Home or Self Care   Consults:  Nephrology   Significant Diagnostic Studies: Dg Chest Port 1 View  11/25/2013   CLINICAL DATA:  Code sepsis.  Fever.  EXAM: PORTABLE CHEST - 1 VIEW  COMPARISON:  Chest radiograph performed 11/04/2013  FINDINGS: The lungs are well-aerated. Retrocardiac airspace opacity raises concern for pneumonia, given the patient's symptoms. There is no evidence of pleural effusion or pneumothorax.  The cardiomediastinal silhouette is borderline normal in size. A pacemaker is noted overlying the left chest wall, with leads ending overlying the right atrium and right ventricle. A right-sided dual-lumen catheter is noted ending about the cavoatrial junction. No acute osseous abnormalities are seen.  IMPRESSION: Retrocardiac airspace opacification raises concern for pneumonia.   Electronically Signed   By: Garald Balding M.D.   On: 11/25/2013 04:10   Dg Chest Portable 1 View  11/04/2013   CLINICAL DATA:  Chest pain  EXAM: PORTABLE CHEST - 1 VIEW  COMPARISON:  10/16/2013  FINDINGS: Dual-chamber pacer leads from the left are in unremarkable position. There is a right IJ dialysis catheter, tip at the upper right atrium.  Normal heart size and negative upper mediastinal contours. There is a trace left pleural effusion  with overlying bandlike opacity suggesting atelectasis. The right chest is clear. No pneumothorax.  IMPRESSION: Small left pleural effusion with atelectasis, decreased from 10/16/2013.   Electronically Signed   By: Jorje Guild M.D.   On: 11/04/2013 23:59      Microbiology: No results found for this or any previous visit (from the past 240 hour(s)).   Labs: Results for orders placed during the hospital encounter of 11/25/13 (from the past 48 hour(s))  CBC WITH DIFFERENTIAL     Status: Abnormal   Collection Time    11/25/13  2:40 AM      Result Value Ref Range   WBC 26.5 (*) 4.0 - 10.5 K/uL   RBC 4.63  4.22 - 5.81 MIL/uL   Hemoglobin 13.1   13.0 - 17.0 g/dL   HCT 41.9  39.0 - 52.0 %   MCV 90.5  78.0 - 100.0 fL   MCH 28.3  26.0 - 34.0 pg   MCHC 31.3  30.0 - 36.0 g/dL   RDW 20.7 (*) 11.5 - 15.5 %   Platelets 135 (*) 150 - 400 K/uL   Comment: PLATELET COUNT CONFIRMED BY SMEAR     SPECIMEN CHECKED FOR CLOTS     REPEATED TO VERIFY   Neutrophils Relative % 95 (*) 43 - 77 %   Lymphocytes Relative 2 (*) 12 - 46 %   Monocytes Relative 3  3 - 12 %   Eosinophils Relative 0  0 - 5 %   Basophils Relative 0  0 - 1 %   Neutro Abs 25.2 (*) 1.7 - 7.7 K/uL   Lymphs Abs 0.5 (*) 0.7 - 4.0 K/uL   Monocytes Absolute 0.8  0.1 - 1.0 K/uL   Eosinophils Absolute 0.0  0.0 - 0.7 K/uL   Basophils Absolute 0.0  0.0 - 0.1 K/uL   RBC Morphology ELLIPTOCYTES     Comment: TEARDROP CELLS     POLYCHROMASIA PRESENT   WBC Morphology TOXIC GRANULATION     Smear Review LARGE PLATELETS PRESENT     Comment: PLATELETS APPEAR ADEQUATE  COMPREHENSIVE METABOLIC PANEL     Status: Abnormal   Collection Time    11/25/13  2:40 AM      Result Value Ref Range   Sodium 143  137 - 147 mEq/L   Potassium 4.2  3.7 - 5.3 mEq/L   Chloride 102  96 - 112 mEq/L   CO2 22  19 - 32 mEq/L   Glucose, Bld 61 (*) 70 - 99 mg/dL   BUN 22  6 - 23 mg/dL   Creatinine, Ser 2.92 (*) 0.50 - 1.35 mg/dL   Calcium 8.3 (*) 8.4 - 10.5 mg/dL   Total Protein 5.9 (*) 6.0 - 8.3 g/dL   Albumin 2.1 (*) 3.5 - 5.2 g/dL   AST 20  0 - 37 U/L   ALT 18  0 - 53 U/L   Alkaline Phosphatase 81  39 - 117 U/L   Total Bilirubin 0.7  0.3 - 1.2 mg/dL   GFR calc non Af Amer 20 (*) >90 mL/min   GFR calc Af Amer 23 (*) >90 mL/min   Comment: (NOTE)     The eGFR has been calculated using the CKD EPI equation.     This calculation has not been validated in all clinical situations.     eGFR's persistently <90 mL/min signify possible Chronic Kidney     Disease.   Anion gap 19 (*) 5 - 15  I-STAT TROPOININ, ED  Status: None   Collection Time    11/25/13  2:49 AM      Result Value Ref Range   Troponin i,  poc 0.08  0.00 - 0.08 ng/mL   Comment 3            Comment: Due to the release kinetics of cTnI,     a negative result within the first hours     of the onset of symptoms does not rule out     myocardial infarction with certainty.     If myocardial infarction is still suspected,     repeat the test at appropriate intervals.  I-STAT CG4 LACTIC ACID, ED     Status: Abnormal   Collection Time    11/25/13  2:51 AM      Result Value Ref Range   Lactic Acid, Venous 7.96 (*) 0.5 - 2.2 mmol/L  CK     Status: None   Collection Time    11/25/13  3:28 AM      Result Value Ref Range   Total CK 31  7 - 232 U/L  I-STAT CG4 LACTIC ACID, ED     Status: Abnormal   Collection Time    11/25/13  5:02 AM      Result Value Ref Range   Lactic Acid, Venous 3.27 (*) 0.5 - 2.2 mmol/L  GLUCOSE, CAPILLARY     Status: None   Collection Time    11/25/13  8:23 AM      Result Value Ref Range   Glucose-Capillary 77  70 - 99 mg/dL   Comment 1 Documented in Chart     Comment 2 Notify RN    PROTIME-INR     Status: Abnormal   Collection Time    11/25/13  8:53 AM      Result Value Ref Range   Prothrombin Time 17.1 (*) 11.6 - 15.2 seconds   INR 1.39  0.00 - 1.49  CORTISOL     Status: None   Collection Time    11/25/13  8:53 AM      Result Value Ref Range   Cortisol, Plasma 16.7     Comment: (NOTE)     AM:  4.3 - 22.4 ug/dL     PM:  3.1 - 16.7 ug/dL     Performed at [place] ACID, PLASMA     Status: None   Collection Time    11/25/13  8:55 AM      Result Value Ref Range   Lactic Acid, Venous 1.4  0.5 - 2.2 mmol/L  URINALYSIS, ROUTINE W REFLEX MICROSCOPIC     Status: Abnormal   Collection Time    11/25/13 10:47 AM      Result Value Ref Range   Color, Urine RED (*) YELLOW   Comment: BIOCHEMICALS MAY BE AFFECTED BY COLOR   APPearance CLOUDY (*) CLEAR   Specific Gravity, Urine 1.014  1.005 - 1.030   pH 8.5 (*) 5.0 - 8.0   Glucose, UA NEGATIVE  NEGATIVE mg/dL   Hgb urine dipstick  LARGE (*) NEGATIVE   Bilirubin Urine SMALL (*) NEGATIVE   Ketones, ur 15 (*) NEGATIVE mg/dL   Protein, ur 100 (*) NEGATIVE mg/dL   Urobilinogen, UA 1.0  0.0 - 1.0 mg/dL   Nitrite NEGATIVE  NEGATIVE   Leukocytes, UA SMALL (*) NEGATIVE  STREP PNEUMONIAE URINARY ANTIGEN     Status: None   Collection Time    11/25/13 10:47 AM  Result Value Ref Range   Strep Pneumo Urinary Antigen NEGATIVE  NEGATIVE   Comment:            Infection due to S. pneumoniae     cannot be absolutely ruled out     since the antigen present     may be below the detection limit     of the test.  URINE MICROSCOPIC-ADD ON     Status: None   Collection Time    11/25/13 10:47 AM      Result Value Ref Range   Squamous Epithelial / LPF RARE  RARE   WBC, UA 3-6  <3 WBC/hpf   RBC / HPF TOO NUMEROUS TO COUNT  <3 RBC/hpf  GLUCOSE, CAPILLARY     Status: None   Collection Time    11/25/13 11:46 AM      Result Value Ref Range   Glucose-Capillary 93  70 - 99 mg/dL   Comment 1 Documented in Chart     Comment 2 Notify RN    GLUCOSE, CAPILLARY     Status: Abnormal   Collection Time    11/25/13  3:20 PM      Result Value Ref Range   Glucose-Capillary 111 (*) 70 - 99 mg/dL   Comment 1 Documented in Chart     Comment 2 Notify RN    PHOSPHORUS     Status: None   Collection Time    11/25/13  7:20 PM      Result Value Ref Range   Phosphorus 3.8  2.3 - 4.6 mg/dL  GLUCOSE, CAPILLARY     Status: None   Collection Time    11/25/13  9:56 PM      Result Value Ref Range   Glucose-Capillary 93  70 - 99 mg/dL   Comment 1 Notify RN    COMPREHENSIVE METABOLIC PANEL     Status: Abnormal   Collection Time    11/26/13  4:40 AM      Result Value Ref Range   Sodium 140  137 - 147 mEq/L   Potassium 3.6 (*) 3.7 - 5.3 mEq/L   Chloride 106  96 - 112 mEq/L   CO2 19  19 - 32 mEq/L   Glucose, Bld 95  70 - 99 mg/dL   BUN 35 (*) 6 - 23 mg/dL   Comment: DELTA CHECK NOTED   Creatinine, Ser 3.64 (*) 0.50 - 1.35 mg/dL   Calcium 7.8  (*) 8.4 - 10.5 mg/dL   Total Protein 4.8 (*) 6.0 - 8.3 g/dL   Albumin 1.7 (*) 3.5 - 5.2 g/dL   AST 18  0 - 37 U/L   ALT 13  0 - 53 U/L   Alkaline Phosphatase 70  39 - 117 U/L   Total Bilirubin 0.5  0.3 - 1.2 mg/dL   GFR calc non Af Amer 15 (*) >90 mL/min   GFR calc Af Amer 18 (*) >90 mL/min   Comment: (NOTE)     The eGFR has been calculated using the CKD EPI equation.     This calculation has not been validated in all clinical situations.     eGFR's persistently <90 mL/min signify possible Chronic Kidney     Disease.   Anion gap 15  5 - 15  CBC WITH DIFFERENTIAL     Status: Abnormal   Collection Time    11/26/13  4:40 AM      Result Value Ref Range   WBC 16.1 (*) 4.0 -  10.5 K/uL   RBC 3.84 (*) 4.22 - 5.81 MIL/uL   Hemoglobin 10.7 (*) 13.0 - 17.0 g/dL   Comment: DELTA CHECK NOTED     REPEATED TO VERIFY   HCT 34.6 (*) 39.0 - 52.0 %   MCV 90.1  78.0 - 100.0 fL   MCH 27.9  26.0 - 34.0 pg   MCHC 30.9  30.0 - 36.0 g/dL   RDW 20.6 (*) 11.5 - 15.5 %   Platelets 149 (*) 150 - 400 K/uL   Neutrophils Relative % 94 (*) 43 - 77 %   Neutro Abs 15.1 (*) 1.7 - 7.7 K/uL   Lymphocytes Relative 3 (*) 12 - 46 %   Lymphs Abs 0.5 (*) 0.7 - 4.0 K/uL   Monocytes Relative 3  3 - 12 %   Monocytes Absolute 0.5  0.1 - 1.0 K/uL   Eosinophils Relative 0  0 - 5 %   Eosinophils Absolute 0.0  0.0 - 0.7 K/uL   Basophils Relative 0  0 - 1 %   Basophils Absolute 0.0  0.0 - 0.1 K/uL  GLUCOSE, CAPILLARY     Status: None   Collection Time    11/26/13  7:46 AM      Result Value Ref Range   Glucose-Capillary 88  70 - 99 mg/dL   Comment 1 Documented in Chart     Comment 2 Notify RN       HPI  73 y.o. year-old with hx myeloma, ESRD on HD since June.  Pt says the night before last he fell in the bathroom and hit his left face. He has a large left periorbital hematoma Then last night he felt very weak, his knees were aching severely and he was SOB and then came to the ER. BP was low and he rec'd IVF's and was  admitted. He presented with weakness, body aching, cough productive of yellow sputum, fevers, chills, several day sin duration but worse over the past 24 hours.  In ED, patient was found to have leukocytosis with WBC 26.5. X-ray showed retrocardiac airspace opacification concerning for pneumonia. IV antibiotics were started. Patient was admitted to step down unit.   HOSPITAL COURSE: Severe sepsis secondary to HCAP:  Admitted to step down, started on cefepime and vancomycin Stress dose steroids to support his blood pressure the patient is chronically on Decadron Patient threatened to leave [place] for no apparent reason  Wife notified, she called signed AMA papers, was okay with him leaving AMA - Blood cultures x 2 pending  Discussed increased risk of death from refusing to continue hospitalization Prescription for levofloxacin called into his pharmacy Not sure if the patient will be compliant with his antibiotics  ESRD-HD  - appreciate nephrology team input  - pt has completed HD Friday , next hemo dialysis 9/14 - not volume overloaded on exam but will hold off on IVF  - diet advanced   Multiple myeloma  - continue home dexamethasone   Stage IV pressure ulcer of sacrum:  Has a wound VAC at home Hospital wound VAC removed RN covered it with a form dressing Home health orders placed prior to discharge    Discharge Exam  Blood pressure 131/55, pulse 66, temperature 98.5 F (36.9 C), temperature source Oral, resp. rate 19, height 5' 5"  (1.651 m), weight 70.4 kg (155 lb 3.3 oz), SpO2 93.00%.  Older adult male, large ecchymosis L eye  No rash, cyanosis or gangrene  Sclera anicteric, throat clear  No  jvd or bruits  Chest clear bilat, occ rhonchi, no rales or wheezing  RRR faint SEM no RG  Abd soft, NTND, +BS no ascites or HSM  R chest IJ cath clean exit site  RUA AV fistula patent, immature           Discharge Instructions   Diet - low sodium heart  healthy    Complete by:  As directed      Increase activity slowly    Complete by:  As directed              Signed: Zamiya Dillard 11/26/2013, 10:39 AM

## 2013-11-26 NOTE — Progress Notes (Signed)
Pt leaving AMA saying we didn't want to care for him because we were moving him and that we weren't treating his pneumonia. Explained to the pt that we have been giving him antibiotics for his pneumonia and that he was getting better and we are a step-down unit and he doesn't require as much care for this floor and we were sending him to a med-surg floor. Pt was adamant about leaving. Notified Dr. Allyson Sabal. She said we could keep him on this floor for now. Offered this to the pt and he was not open to that either. Called case management about pt leaving AMA and needing home health to see him again once he gets home to reapply wound vac. Case manager and I both explained that insurance might not pay for home health because he is leaving AMA and pt said he didn't care. He said, "I can go somewhere else to use my insurance. I can go to a different state." D/C'd pts IVs and wound vac and placed a foam dressing on pts sacrum. Pt d/c'd home via wheelchair by wife. Informed pt and family that Dr. Allyson Sabal was going to give the prescriptions. Pt stated he didn't care and left before I could find out where they were getting called in to. Pt left via family vehicle by wife. Both wife and pt signed AMA paperwork. All this reported to [facility], Camera operator, and Dr. Allyson Sabal.

## 2013-11-26 NOTE — Care Management Note (Signed)
    Page 1 of 1   11/26/2013     6:35:13 PM CARE MANAGEMENT NOTE 11/26/2013  Patient:  Scott [name], Scott [name]   Account Number:  [id number]  Date Initiated:  11/26/2013  Documentation initiated by:  [facility]  Subjective/Objective Assessment:   adm: weakness and whole-body aching     Action/Plan:   discharge planning   Anticipated DC Date:  11/26/2013   Anticipated DC Plan:  [place]  CM consult      [facility] Choice  HOME HEALTH   Choice offered to / List presented to:  C-3 Spouse        HH arranged  HH-1 RN      [facility] agency  [place]   Status of service:  Completed, signed off Medicare Important Message given?   (If response is "NO", the following Medicare IM given date fields will be blank) Date Medicare IM given:   Medicare IM given by:   Date Additional Medicare IM given:   Additional Medicare IM given by:    Discharge Disposition:  [place]  Per UR Regulation:    If discussed at Long Length of Stay Meetings, dates discussed:    Comments:  11/26/13 11:00 CM received call from RN as she states pt is leaving AMA and needs home health.  CM met with pt and spouse and explained if they left AMA, their insurance may not cover the hospitalization.  MD placed DC summary and noted pt leaving AMA.  MD also placed orders for [facility].  Pt is already seen [place]. CM called [facility] and was referred to the RN on call number (as it is the weekend); and was requested to fax orders to [phone].  CM faxed facesheet, orders, F2F, H&P, and DC summary to aforementioned number.  No other CM needs were communicated.  Mariane Masters, BSN, CM [phone].

## 2013-11-26 NOTE — Progress Notes (Signed)
PT Cancellation Note  Patient Details Name: MYKELL RAWL MRN: [id number] DOB: 09-08-1940   Cancelled Treatment:     Awaiting update of activity orders, currently on "STRICT BEDREST".  Please update so that we may proceed with PT eval if appropriate.  Thank you,   Herbie Drape 11/26/2013, 8:56 AM

## 2013-11-26 NOTE — Progress Notes (Signed)
  [facility] Progress Note   Subjective: Patient very upset today and is threatening to leave AMA, won't discuss anything, refused standing weight   Filed Vitals:   11/25/13 2357 11/26/13 0434 11/26/13 0700 11/26/13 0750  BP: 124/57 146/61  131/55  Pulse: 67 77  66  Temp: 97.8 F (36.6 C) 98 F (36.7 C) 98.5 F (36.9 C)   TempSrc: Oral Oral Oral   Resp: $Remo'22 22  19  'Hydad$ Height:      Weight:  70.4 kg (155 lb 3.3 oz)    SpO2: 99% 100%  93%   Exam: Refused exam No distress grossly  HD: MWF [place]  4h 400/A1.5 61kg 3K/2.25 [place] Heparin 2000 R IJ cath (maturing AVF RUA)  Aranesp 120 ug weekly   Assessment:  1 HCAP - on IV abx, wbc down 2 ESRD on HD  3 Sacral decub- advanced, wound VAC in place  4 Multiple myeloma - on decadron now only  5 Anemia on aranesp  6 HPTH check phos  7 HTN/vol- no vol excess on exam, up by wts but doubt accuracy 8 Dispo - may be leaving AMA today   Plan- as above, HD tomorrow if still here    Kelly Splinter MD  pager [phone]    cell [phone]  11/26/2013, 9:26 AM     Recent Labs Lab 11/25/13 0240 11/25/13 1920 11/26/13 0440  NA 143  --  140  K 4.2  --  3.6*  CL 102  --  106  CO2 22  --  19  GLUCOSE 61*  --  95  BUN 22  --  35*  CREATININE 2.92*  --  3.64*  CALCIUM 8.3*  --  7.8*  PHOS  --  3.8  --     Recent Labs Lab 11/25/13 0240 11/26/13 0440  AST 20 18  ALT 18 13  ALKPHOS 81 70  BILITOT 0.7 0.5  PROT 5.9* 4.8*  ALBUMIN 2.1* 1.7*    Recent Labs Lab 11/25/13 0240 11/26/13 0440  WBC 26.5* 16.1*  NEUTROABS 25.2* 15.1*  HGB 13.1 10.7*  HCT 41.9 34.6*  MCV 90.5 90.1  PLT 135* 149*   . acyclovir  400 mg Oral Daily  . amiodarone  200 mg Oral Daily  . [START ON 11/27/2013] ceFEPime (MAXIPIME) IV  2 g Intravenous Q M,W,F-HD  . [START ON 11/28/2013] dexamethasone  40 mg Oral Q Tue  . ferrous sulfate  325 mg Oral Q breakfast  . heparin  5,000 Units Subcutaneous 3 times per day  . hydrocortisone sod succinate  (SOLU-CORTEF) inj  50 mg Intravenous Q6H  . megestrol  800 mg Oral QID  . midodrine  10 mg Oral TID WC  . tamsulosin  0.4 mg Oral Daily  . [START ON 11/27/2013] vancomycin  750 mg Intravenous Q M,W,F-HD     HYDROcodone-acetaminophen, ipratropium-albuterol, polyvinyl alcohol

## 2013-11-27 NOTE — Progress Notes (Signed)
Utilization review completed.  

## 2013-12-01 LAB — CULTURE, BLOOD (ROUTINE X 2)
Culture: NO GROWTH
Culture: NO GROWTH
Culture: NO GROWTH

## 2013-12-01 NOTE — Progress Notes (Addendum)
CRITICAL VALUE ALERT  Critical value received:  Positive blood cultures - gram positive rods in anaerobic bottle  Date of notification:  12/01/2013  Time of notification:  0000  Critical value read back:Yes.    Nurse who received alert:  K. Royden Purl  MD notified (1st page):  Dr. Allyson Sabal  Time of first page:  [phone]  MD notified (2nd page):  Time of second page:  Responding MD:  Dr. Allyson Sabal  Time MD responded:  [phone]   (pt left AMA 11/25/13)

## 2013-12-02 NOTE — Progress Notes (Signed)
Final BC report pending  Until then will monitor  Please call [phone] if positive results confirmed  This is my personal cell

## 2013-12-03 LAB — CULTURE, BLOOD (ROUTINE X 2)

## 2013-12-05 ENCOUNTER — Encounter (HOSPITAL_COMMUNITY): Payer: Self-pay | Admitting: Internal Medicine

## 2013-12-05 ENCOUNTER — Inpatient Hospital Stay (HOSPITAL_COMMUNITY): Payer: Medicare HMO

## 2013-12-05 ENCOUNTER — Inpatient Hospital Stay (HOSPITAL_COMMUNITY)
Admission: AD | Admit: 2013-12-05 | Discharge: 2013-12-10 | DRG: 193 | Disposition: A | Discharge status: Home Health | Payer: Medicare HMO | Source: Other Acute Inpatient Hospital | Attending: Internal Medicine | Admitting: Internal Medicine

## 2013-12-05 DIAGNOSIS — Z950 Presence of cardiac pacemaker: Secondary | ICD-10-CM | POA: Diagnosis not present

## 2013-12-05 DIAGNOSIS — C9000 Multiple myeloma not having achieved remission: Secondary | ICD-10-CM

## 2013-12-05 DIAGNOSIS — Z992 Dependence on renal dialysis: Secondary | ICD-10-CM | POA: Diagnosis not present

## 2013-12-05 DIAGNOSIS — N2581 Secondary hyperparathyroidism of renal origin: Secondary | ICD-10-CM | POA: Diagnosis present

## 2013-12-05 DIAGNOSIS — N400 Enlarged prostate without lower urinary tract symptoms: Secondary | ICD-10-CM | POA: Diagnosis present

## 2013-12-05 DIAGNOSIS — J9601 Acute respiratory failure with hypoxia: Secondary | ICD-10-CM | POA: Diagnosis present

## 2013-12-05 DIAGNOSIS — Z833 Family history of diabetes mellitus: Secondary | ICD-10-CM | POA: Diagnosis not present

## 2013-12-05 DIAGNOSIS — R627 Adult failure to thrive: Secondary | ICD-10-CM | POA: Diagnosis present

## 2013-12-05 DIAGNOSIS — M19019 Primary osteoarthritis, unspecified shoulder: Secondary | ICD-10-CM | POA: Diagnosis present

## 2013-12-05 DIAGNOSIS — R5381 Other malaise: Secondary | ICD-10-CM | POA: Diagnosis present

## 2013-12-05 DIAGNOSIS — I82509 Chronic embolism and thrombosis of unspecified deep veins of unspecified lower extremity: Secondary | ICD-10-CM | POA: Diagnosis present

## 2013-12-05 DIAGNOSIS — D649 Anemia, unspecified: Secondary | ICD-10-CM | POA: Diagnosis present

## 2013-12-05 DIAGNOSIS — I2789 Other specified pulmonary heart diseases: Secondary | ICD-10-CM | POA: Diagnosis present

## 2013-12-05 DIAGNOSIS — F17200 Nicotine dependence, unspecified, uncomplicated: Secondary | ICD-10-CM | POA: Diagnosis present

## 2013-12-05 DIAGNOSIS — Z791 Long term (current) use of non-steroidal anti-inflammatories (NSAID): Secondary | ICD-10-CM

## 2013-12-05 DIAGNOSIS — L89109 Pressure ulcer of unspecified part of back, unspecified stage: Secondary | ICD-10-CM | POA: Diagnosis present

## 2013-12-05 DIAGNOSIS — R0602 Shortness of breath: Secondary | ICD-10-CM

## 2013-12-05 DIAGNOSIS — L89154 Pressure ulcer of sacral region, stage 4: Secondary | ICD-10-CM | POA: Diagnosis present

## 2013-12-05 DIAGNOSIS — I120 Hypertensive chronic kidney disease with stage 5 chronic kidney disease or end stage renal disease: Secondary | ICD-10-CM | POA: Diagnosis present

## 2013-12-05 DIAGNOSIS — I9589 Other hypotension: Secondary | ICD-10-CM | POA: Diagnosis present

## 2013-12-05 DIAGNOSIS — N184 Chronic kidney disease, stage 4 (severe): Secondary | ICD-10-CM | POA: Diagnosis present

## 2013-12-05 DIAGNOSIS — J9600 Acute respiratory failure, unspecified whether with hypoxia or hypercapnia: Secondary | ICD-10-CM

## 2013-12-05 DIAGNOSIS — I4901 Ventricular fibrillation: Secondary | ICD-10-CM | POA: Diagnosis present

## 2013-12-05 DIAGNOSIS — N186 End stage renal disease: Secondary | ICD-10-CM

## 2013-12-05 DIAGNOSIS — L8994 Pressure ulcer of unspecified site, stage 4: Secondary | ICD-10-CM | POA: Diagnosis present

## 2013-12-05 DIAGNOSIS — M899 Disorder of bone, unspecified: Secondary | ICD-10-CM | POA: Diagnosis present

## 2013-12-05 DIAGNOSIS — E119 Type 2 diabetes mellitus without complications: Secondary | ICD-10-CM | POA: Diagnosis present

## 2013-12-05 DIAGNOSIS — I4891 Unspecified atrial fibrillation: Secondary | ICD-10-CM | POA: Diagnosis present

## 2013-12-05 DIAGNOSIS — M949 Disorder of cartilage, unspecified: Secondary | ICD-10-CM

## 2013-12-05 DIAGNOSIS — R600 Localized edema: Secondary | ICD-10-CM

## 2013-12-05 DIAGNOSIS — I82402 Acute embolism and thrombosis of unspecified deep veins of left lower extremity: Secondary | ICD-10-CM | POA: Diagnosis present

## 2013-12-05 DIAGNOSIS — J189 Pneumonia, unspecified organism: Secondary | ICD-10-CM | POA: Diagnosis present

## 2013-12-05 HISTORY — DX: Essential (primary) hypertension: I10

## 2013-12-05 HISTORY — DX: End stage renal disease: N18.6

## 2013-12-05 HISTORY — DX: Type 2 diabetes mellitus without complications: E11.9

## 2013-12-05 HISTORY — DX: Dependence on renal dialysis: Z99.2

## 2013-12-05 HISTORY — DX: Chronic obstructive pulmonary disease, unspecified: J44.9

## 2013-12-05 LAB — GLUCOSE, CAPILLARY
GLUCOSE-CAPILLARY: 247 mg/dL — AB (ref 70–99)
Glucose-Capillary: 167 mg/dL — ABNORMAL HIGH (ref 70–99)

## 2013-12-05 LAB — TROPONIN I: Troponin I: <0.3 ng/mL (ref <0.30)

## 2013-12-05 LAB — MRSA PCR SCREENING: MRSA by PCR: NEGATIVE

## 2013-12-05 MED ORDER — MIDODRINE HCL 5 MG PO TABS
10.0000 mg | ORAL_TABLET | Freq: Three times a day (TID) | ORAL | Status: DC
  Administered 2013-12-06: 10 mg via ORAL
  Filled 2013-12-05 (×4): qty 2

## 2013-12-05 MED ORDER — FERROUS SULFATE 325 (65 FE) MG PO TABS
325.0000 mg | ORAL_TABLET | Freq: Every day | ORAL | Status: DC
  Administered 2013-12-06: 325 mg via ORAL
  Filled 2013-12-05 (×2): qty 1

## 2013-12-05 MED ORDER — MEGESTROL ACETATE 40 MG/ML PO SUSP
800.0000 mg | Freq: Every day | ORAL | Status: DC
  Administered 2013-12-06 – 2013-12-08 (×3): 800 mg via ORAL
  Filled 2013-12-05 (×4): qty 20

## 2013-12-05 MED ORDER — ACYCLOVIR 400 MG PO TABS
400.0000 mg | ORAL_TABLET | Freq: Every day | ORAL | Status: DC
  Filled 2013-12-05: qty 1

## 2013-12-05 MED ORDER — DEXAMETHASONE 6 MG PO TABS
40.0000 mg | ORAL_TABLET | ORAL | Status: DC
  Filled 2013-12-05: qty 1

## 2013-12-05 MED ORDER — MEGESTROL ACETATE 40 MG/ML PO SUSP
800.0000 mg | Freq: Four times a day (QID) | ORAL | Status: DC
  Filled 2013-12-05 (×2): qty 20

## 2013-12-05 MED ORDER — IPRATROPIUM-ALBUTEROL 0.5-2.5 (3) MG/3ML IN SOLN
3.0000 mL | Freq: Three times a day (TID) | RESPIRATORY_TRACT | Status: DC
  Administered 2013-12-06 – 2013-12-10 (×10): 3 mL via RESPIRATORY_TRACT
  Filled 2013-12-05 (×13): qty 3

## 2013-12-05 MED ORDER — DEXTROSE 5 % IV SOLN
2.0000 g | INTRAVENOUS | Status: DC
  Administered 2013-12-05: 2 g via INTRAVENOUS
  Filled 2013-12-05 (×4): qty 2

## 2013-12-05 MED ORDER — IPRATROPIUM-ALBUTEROL 0.5-2.5 (3) MG/3ML IN SOLN
3.0000 mL | Freq: Four times a day (QID) | RESPIRATORY_TRACT | Status: DC
  Administered 2013-12-05: 3 mL via RESPIRATORY_TRACT
  Filled 2013-12-05: qty 3

## 2013-12-05 MED ORDER — ACETAMINOPHEN 650 MG RE SUPP: 650.0000 mg | Freq: Four times a day (QID) | RECTAL | Status: DC | PRN

## 2013-12-05 MED ORDER — CETYLPYRIDINIUM CHLORIDE 0.05 % MT LIQD
7.0000 mL | Freq: Two times a day (BID) | OROMUCOSAL | Status: DC
  Administered 2013-12-06 – 2013-12-10 (×8): 7 mL via OROMUCOSAL

## 2013-12-05 MED ORDER — SODIUM CHLORIDE 0.9 % IJ SOLN
3.0000 mL | INTRAMUSCULAR | Status: DC | PRN
Start: 2013-12-05 — End: 2013-12-10

## 2013-12-05 MED ORDER — ACYCLOVIR 200 MG PO CAPS
400.0000 mg | ORAL_CAPSULE | Freq: Every day | ORAL | Status: DC
  Administered 2013-12-06 – 2013-12-10 (×5): 400 mg via ORAL
  Filled 2013-12-05 (×6): qty 2

## 2013-12-05 MED ORDER — VANCOMYCIN HCL IN DEXTROSE 750-5 MG/150ML-% IV SOLN
750.0000 mg | INTRAVENOUS | Status: DC
  Administered 2013-12-06: 750 mg via INTRAVENOUS
  Filled 2013-12-05 (×4): qty 150

## 2013-12-05 MED ORDER — SODIUM CHLORIDE 0.9 % IV SOLN
250.0000 mL | INTRAVENOUS | Status: DC | PRN
Start: 2013-12-05 — End: 2013-12-10
  Administered 2013-12-05: 19:00:00 via INTRAVENOUS

## 2013-12-05 MED ORDER — SODIUM CHLORIDE 0.9 % IJ SOLN
3.0000 mL | Freq: Two times a day (BID) | INTRAMUSCULAR | Status: DC
  Administered 2013-12-05 – 2013-12-09 (×6): 3 mL via INTRAVENOUS

## 2013-12-05 MED ORDER — ALBUTEROL SULFATE (2.5 MG/3ML) 0.083% IN NEBU
2.5000 mg | INHALATION_SOLUTION | RESPIRATORY_TRACT | Status: DC | PRN
  Administered 2013-12-06: 2.5 mg via RESPIRATORY_TRACT
  Filled 2013-12-05: qty 3

## 2013-12-05 MED ORDER — HYDROCODONE-ACETAMINOPHEN 5-325 MG PO TABS
0.5000 | ORAL_TABLET | Freq: Four times a day (QID) | ORAL | Status: DC | PRN
  Administered 2013-12-05 – 2013-12-06 (×2): 0.5 via ORAL
  Filled 2013-12-05 (×2): qty 1

## 2013-12-05 MED ORDER — ALBUTEROL SULFATE (2.5 MG/3ML) 0.083% IN NEBU: 2.5000 mg | INHALATION_SOLUTION | Freq: Four times a day (QID) | RESPIRATORY_TRACT | Status: DC

## 2013-12-05 MED ORDER — ACETAMINOPHEN 325 MG PO TABS: 650.0000 mg | ORAL_TABLET | Freq: Four times a day (QID) | ORAL | Status: DC | PRN

## 2013-12-05 MED ORDER — HEPARIN SODIUM (PORCINE) 5000 UNIT/ML IJ SOLN
5000.0000 [IU] | Freq: Three times a day (TID) | INTRAMUSCULAR | Status: DC
  Administered 2013-12-05 – 2013-12-06 (×2): 5000 [IU] via SUBCUTANEOUS
  Filled 2013-12-05 (×4): qty 1

## 2013-12-05 MED ORDER — TAMSULOSIN HCL 0.4 MG PO CAPS
0.4000 mg | ORAL_CAPSULE | Freq: Every day | ORAL | Status: DC
  Administered 2013-12-06 – 2013-12-10 (×5): 0.4 mg via ORAL
  Filled 2013-12-05 (×6): qty 1

## 2013-12-05 MED ORDER — HYDROCODONE-ACETAMINOPHEN 5-325 MG PO TABS: 1.0000 | ORAL_TABLET | ORAL | Status: DC | PRN

## 2013-12-05 MED ORDER — ONDANSETRON HCL 4 MG/2ML IJ SOLN: 4.0000 mg | Freq: Four times a day (QID) | INTRAMUSCULAR | Status: DC | PRN

## 2013-12-05 MED ORDER — VANCOMYCIN HCL 10 G IV SOLR
1500.0000 mg | Freq: Once | INTRAVENOUS | Status: AC
  Administered 2013-12-05: 1500 mg via INTRAVENOUS
  Filled 2013-12-05: qty 1500

## 2013-12-05 MED ORDER — DOCUSATE SODIUM 100 MG PO CAPS
100.0000 mg | ORAL_CAPSULE | Freq: Two times a day (BID) | ORAL | Status: DC
  Administered 2013-12-06 – 2013-12-10 (×7): 100 mg via ORAL
  Filled 2013-12-05 (×12): qty 1

## 2013-12-05 MED ORDER — IPRATROPIUM BROMIDE 0.02 % IN SOLN: 0.5000 mg | Freq: Four times a day (QID) | RESPIRATORY_TRACT | Status: DC

## 2013-12-05 MED ORDER — ONDANSETRON HCL 4 MG PO TABS: 4.0000 mg | ORAL_TABLET | Freq: Four times a day (QID) | ORAL | Status: DC | PRN

## 2013-12-05 MED ORDER — AMIODARONE HCL 200 MG PO TABS
200.0000 mg | ORAL_TABLET | Freq: Every day | ORAL | Status: DC
  Administered 2013-12-06 – 2013-12-10 (×5): 200 mg via ORAL
  Filled 2013-12-05 (×6): qty 1

## 2013-12-05 NOTE — Progress Notes (Signed)
Patient arrived from [facility].  Dr Karleen Hampshire paged and notified.

## 2013-12-05 NOTE — H&P (Addendum)
[facility] History and Physical  Scott [name] TKP:[id number] DOB: 03/09/1941 DOA: 12/05/2013  Referring physician: Transfer from [place].  PCP: No primary provider on file.   Chief Complaint: SOB.   HPI: Scott [name] is a 73 y.o. male with PMH significant for Multiple Myeloma now on Dialysis since June, recently left the hospital against medical advised on 9-13. He was receiving treatment at that time for PNA. He is still taking Levaquin. He was a transfer from [place] today for further evaluation. PAtient presents to [facility] with worsening SOB, even at rest. He denies chest pain. He is still coughing but is not getting worse. He relates that he almost pass out on Monday when he finished dialysis.   Out side Facility test ; Chest X ray ; Left bibasilar atelectasis or infiltrate. No pulmonary edema. PH 7.5, PCO 2 28, Po2 66, K 5.4, Cr 4.2. WBC 16.  Patient appears comfortable on 2 L of oxygen.   Review of Systems:  Negative, except as per HPI.   Past Medical History  Diagnosis Date  . Renal disorder   . Pacemaker   . Shortness of breath     "when I take one of my chemo pills" (03/17/2013)  . Protein calorie malnutrition   . Multiple myeloma   . Skin cancer of face     "once; left side of my face" (03/17/2013)  . Pneumonia 06/09/2013  . Diabetes mellitus without complication     no meds  . Arthritis     "little in my right shoulder" (03/17/2013)  . History of blood transfusion   . Daily headache     denies  . Cataract    Past Surgical History  Procedure Laterality Date  . Appendectomy    . Kidney surgery Right 2000's    "cut out ~ 1/2"   . Insert / replace / remove pacemaker    . Bascilic vein transposition Right 07/10/2013    Procedure: BASILIC VEIN TRANSPOSITION- FIRST STAGE ;  Surgeon: Conrad Windham, MD;  Location: [facility];  Service: Vascular;  Laterality: Right;  . Insertion of dialysis catheter Right 08/28/2013    Procedure: INSERTION OF DIALYSIS CATHETER in Right  Internal Jugular;  Surgeon: [name], MD;  Location: [facility];  Service: Vascular;  Laterality: Right;  . Bascilic vein transposition Right 09/01/2013    Procedure: BASILIC VEIN TRANSPOSITION - 2ND STAGE AVF CREATION;  Surgeon: [name], MD;  Location: [facility];  Service: Vascular;  Laterality: Right;  . Av fistula placement     Social History:  reports that he has been smoking Cigarettes.  He has a 15 pack-year smoking history. He has never used smokeless tobacco. He reports that he does not drink alcohol or use illicit drugs.  No Known Allergies  Family History  Problem Relation Age of Onset  . Diabetes Mellitus II Father      Prior to Admission medications   Medication Sig Start Date End Date Taking? Authorizing Provider  ACCU-CHEK AVIVA PLUS test strip 1 each by Other route See admin instructions. Check blood sugar once daily. 05/31/13   Historical Provider, MD  ACCU-CHEK SOFTCLIX LANCETS lancets 1 each by Other route See admin instructions. Check blood sugar once daily. 05/31/13   Historical Provider, MD  acyclovir (ZOVIRAX) 400 MG tablet Take 400 mg by mouth daily.    Historical Provider, MD  amiodarone (PACERONE) 200 MG tablet Take 200 mg by mouth daily.    Historical Provider, MD  dexamethasone (DECADRON) 4 MG tablet Take 40 mg by mouth every 7 (seven) days. Every Tuesday.    Historical Provider, MD  ferrous sulfate 325 (65 FE) MG tablet Take 325 mg by mouth daily with breakfast.    Historical Provider, MD  HYDROcodone-acetaminophen (NORCO/VICODIN) 5-325 MG per tablet Take 0.25 tablets by mouth every 6 (six) hours as needed for moderate pain.    Historical Provider, MD  ipratropium-albuterol (DUONEB) 0.5-2.5 (3) MG/3ML SOLN Take 3 mLs by nebulization every 4 (four) hours as needed (for shortness of breath).    Historical Provider, MD  levofloxacin (LEVAQUIN) 500 MG tablet Take 1 tablet (500 mg total) by mouth daily. 11/26/13   Reyne Dumas, MD  megestrol (MEGACE) 40 MG/ML suspension  Take 800 mg by mouth 4 (four) times daily.    Historical Provider, MD  midodrine (PROAMATINE) 10 MG tablet Take 1 tablet (10 mg total) by mouth 3 (three) times daily with meals. 09/07/13   Reyne Dumas, MD  tamsulosin (FLOMAX) 0.4 MG CAPS capsule Take 0.4 mg by mouth daily.    Historical Provider, MD   Physical Exam: Filed Vitals:   12/05/13 1622  BP: 145/67  Pulse: 86  Temp: 97.7 F (36.5 C)  TempSrc: Oral  Resp: 17  SpO2: 97%    Wt Readings from Last 3 Encounters:  11/26/13 70.4 kg (155 lb 3.3 oz)  11/08/13 56.8 kg (125 lb 3.5 oz)  10/10/13 69.854 kg (154 lb)    General:  Appears calm and comfortable, in no distress.  Eyes: PERRL, normal lids, irises & conjunctiva ENT: grossly normal hearing, lips & tongue Neck: no LAD, masses or thyromegaly Cardiovascular: RRR, no m/r/g. Plus 2 edema LE.  Telemetry: SR, no arrhythmias  Respiratory:  Normal respiratory effort. Decreases breath sound bilateral ronchus, crackles bases.  Abdomen: soft, ntnd Skin: no rash or induration seen on limited exam Musculoskeletal: grossly normal tone BUE/BLE Psychiatric: grossly normal mood and affect, speech fluent and appropriate Neurologic: grossly non-focal.          Labs on Admission:  Basic Metabolic Panel: No results found for this basename: NA, K, CL, CO2, GLUCOSE, BUN, CREATININE, CALCIUM, MG, PHOS,  in the last 168 hours Liver Function Tests: No results found for this basename: AST, ALT, ALKPHOS, BILITOT, PROT, ALBUMIN,  in the last 168 hours No results found for this basename: LIPASE, AMYLASE,  in the last 168 hours No results found for this basename: AMMONIA,  in the last 168 hours CBC: No results found for this basename: WBC, NEUTROABS, HGB, HCT, MCV, PLT,  in the last 168 hours Cardiac Enzymes: No results found for this basename: CKTOTAL, CKMB, CKMBINDEX, TROPONINI,  in the last 168 hours  BNP (last 3 results)  Recent Labs  06/08/13 2145 11/04/13 2328  PROBNP 16467.0*  13759.0*   CBG:  Recent Labs Lab 12/05/13 1640  GLUCAP 167*    Radiological Exams on Admission: No results found.  EKG: ordered.   Assessment/Plan Active Problems:   Multiple myeloma   HCAP (healthcare-associated pneumonia)   PNA (pneumonia)   Shortness of breath  1-Acute Respiratory Failure, hypoxic: Suspect secondary to PNA. Chest x ray from outside facility with infiltrates, atelectasis, no pulmonary edema. Will start IV vancomycin and cefepime. Will also check V-Q scan. Doppler lower extremities. Renal consulted for dialysis tomorrow. Continue with nebulizer treatments. Check EKG, cycle cardiac enzymes.   2-Multiple Myeloma; On decadron every week. Will continue with this.   3-Renal Failure, Dialysis, secondary to Multiple Myeloma. Renal will  see patient in am. Will check electrolytes tonight.  4--BPH; continue with flomax.  5-PNA; Will start IV vancomycin and cefepime.   Code Status: Full Code.  DVT Prophylaxis:Heparin.  Family Communication: Wife at bedside,  Disposition Plan: expect 3 to 4 days inpatient.   Time spent: 75 minutes.   Niel Hummer A [facility] Pager [phone]

## 2013-12-05 NOTE — Progress Notes (Signed)
ANTIBIOTIC CONSULT NOTE - INITIAL  Pharmacy Consult for vancomycin and zosyn Indication: HCAP  No Known Allergies  Patient Measurements:   Weight: 70.4 kg as of 11/26/13  Vital Signs: Temp: 97.7 F (36.5 C) (09/22 1622) Temp src: Oral (09/22 1622) BP: 145/67 mmHg (09/22 1622) Pulse Rate: 86 (09/22 1622) Intake/Output from previous day:   Intake/Output from this shift:    Labs: No results found for this basename: WBC, HGB, PLT, LABCREA, CREATININE,  in the last 72 hours The CrCl is unknown because both a height and weight (above a minimum accepted value) are required for this calculation. No results found for this basename: VANCOTROUGH, Corlis Leak, VANCORANDOM, [place], GENTPEAK, GENTRANDOM, [place], TOBRAPEAK, TOBRARND, AMIKACINPEAK, AMIKACINTROU, AMIKACIN,  in the last 72 hours   Microbiology: Recent Results (from the past 720 hour(s))  URINE CULTURE     Status: None   Collection Time    11/05/13  6:25 PM      Result Value Ref Range Status   Specimen Description URINE, RANDOM   Final   Special Requests Immunocompromised   Final   Culture  Setup Time     Final   Value: 11/06/2013 02:05     Performed at [facility]     Final   Value: 40,000 COLONIES/ML     Performed at [facility]   Culture     Final   Value: ESCHERICHIA COLI     Performed at [facility]   Report Status 11/07/2013 FINAL   Final   Organism ID, Bacteria ESCHERICHIA COLI   Final  CULTURE, BLOOD (ROUTINE X 2)     Status: None   Collection Time    11/25/13  3:00 AM      Result Value Ref Range Status   Specimen Description BLOOD LEFT FOREARM   Final   Special Requests BOTTLES DRAWN AEROBIC AND ANAEROBIC [facility] EACH   Final   Culture  Setup Time     Final   Value: 11/25/2013 15:10     Performed at [facility]   Culture     Final   Value: EUBACTERIUM LIMOSUM     Note: Gram Stain Report Called to,Read Back By and Verified With: [place] USSERY 12/01/13 AT  [facility]     Performed at [facility]   Report Status 12/03/2013 FINAL   Final  CULTURE, BLOOD (ROUTINE X 2)     Status: None   Collection Time    11/25/13  3:28 AM      Result Value Ref Range Status   Specimen Description BLOOD LEFT HAND   Final   Special Requests BOTTLES DRAWN AEROBIC AND ANAEROBIC 10CC EACH   Final   Culture  Setup Time     Final   Value: 11/25/2013 15:20     Performed at [facility]   Culture     Final   Value: NO GROWTH 5 DAYS     Note: Culture results may be compromised due to an excessive volume of blood received in culture bottles.     Performed at [facility]   Report Status 12/01/2013 FINAL   Final  CULTURE, BLOOD (ROUTINE X 2)     Status: None   Collection Time    11/25/13  8:45 AM      Result Value Ref Range Status   Specimen Description BLOOD LEFT HAND   Final   Special Requests BOTTLES DRAWN AEROBIC ONLY 3CC  Final   Culture  Setup Time     Final   Value: 11/25/2013 15:21     Performed at [facility]   Culture     Final   Value: NO GROWTH 5 DAYS     Performed at [facility]   Report Status 12/01/2013 FINAL   Final  CULTURE, BLOOD (ROUTINE X 2)     Status: None   Collection Time    11/25/13  8:53 AM      Result Value Ref Range Status   Specimen Description BLOOD LEFT HAND   Final   Special Requests BOTTLES DRAWN AEROBIC ONLY 5CC   Final   Culture  Setup Time     Final   Value: 11/25/2013 15:19     Performed at [facility]   Culture     Final   Value: NO GROWTH 5 DAYS     Performed at [facility]   Report Status 12/01/2013 FINAL   Final  URINE CULTURE     Status: None   Collection Time    11/25/13 10:47 AM      Result Value Ref Range Status   Specimen Description URINE, CLEAN CATCH   Final   Special Requests NONE   Final   Culture  Setup Time     Final   Value: 11/25/2013 19:34     Performed at [facility]     Final   Value: 5,000 COLONIES/ML      Performed at [facility]   Culture     Final   Value: INSIGNIFICANT GROWTH     Performed at [facility]   Report Status 11/26/2013 FINAL   Final    Medical History: Past Medical History  Diagnosis Date  . Renal disorder   . Pacemaker   . Shortness of breath     "when I take one of my chemo pills" (03/17/2013)  . Protein calorie malnutrition   . Multiple myeloma   . Skin cancer of face     "once; left side of my face" (03/17/2013)  . Pneumonia 06/09/2013  . Diabetes mellitus without complication     no meds  . Arthritis     "little in my right shoulder" (03/17/2013)  . History of blood transfusion   . Daily headache     denies  . Cataract     Assessment: 73 yo M transferred from [facility] ED.  Pharmacy consulted to dose cefepime and vancomycin for HCAP.  He was given azithromycin and rocephin in [place] ED and [facility] x 2 were drawn. He was on still taking levaquin PTA.   WBC at [facility] = 16.9.  HD pt on MWF HD.   CXR at [facility]:  L basilar atelecasis or infiltrate.  I called [facility] ED and was informed he was given: Azithromycin 500 mg PO x 1 9/22  Rocephin 1 gm x 1 9/22 vanc 9/22>> Cefepime 9/22>>   Goal of Therapy:  Vancomycin pre-HD level 15-25 mcg/ml  Plan:  1. Cefepime 2 gm IV x 1 dose now, then 2 gm after HD on MWF 2. Vancomycin 1500 mg IV loading dose now, then 750 mg after HD on MWF 3. F/u wbc, temp, culture data, clinical course 4. Check pre-HD vanc level as needed  Eudelia Bunch, Pharm.D. [phone] 12/05/2013 6:09 PM

## 2013-12-06 DIAGNOSIS — R600 Localized edema: Secondary | ICD-10-CM | POA: Diagnosis present

## 2013-12-06 DIAGNOSIS — R5381 Other malaise: Secondary | ICD-10-CM | POA: Diagnosis present

## 2013-12-06 LAB — CBC
HEMATOCRIT: 36.4 % — AB (ref 39.0–52.0)
HEMOGLOBIN: 11.3 g/dL — AB (ref 13.0–17.0)
MCH: 27.7 pg (ref 26.0–34.0)
MCHC: 31 g/dL (ref 30.0–36.0)
MCV: 89.2 fL (ref 78.0–100.0)
Platelets: 127 10*3/uL — ABNORMAL LOW (ref 150–400)
RBC: 4.08 MIL/uL — ABNORMAL LOW (ref 4.22–5.81)
RDW: 19.8 % — AB (ref 11.5–15.5)
WBC: 13.5 10*3/uL — ABNORMAL HIGH (ref 4.0–10.5)

## 2013-12-06 LAB — COMPREHENSIVE METABOLIC PANEL
ALBUMIN: 1.9 g/dL — AB (ref 3.5–5.2)
ALT: 12 U/L (ref 0–53)
AST: 17 U/L (ref 0–37)
Alkaline Phosphatase: 76 U/L (ref 39–117)
Anion gap: 17 — ABNORMAL HIGH (ref 5–15)
BUN: 39 mg/dL — ABNORMAL HIGH (ref 6–23)
CO2: 23 mEq/L (ref 19–32)
Calcium: 7.9 mg/dL — ABNORMAL LOW (ref 8.4–10.5)
Chloride: 103 mEq/L (ref 96–112)
Creatinine, Ser: 4.75 mg/dL — ABNORMAL HIGH (ref 0.50–1.35)
GFR calc Af Amer: 13 mL/min — ABNORMAL LOW (ref >90)
GFR calc non Af Amer: 11 mL/min — ABNORMAL LOW (ref >90)
Glucose, Bld: 150 mg/dL — ABNORMAL HIGH (ref 70–99)
POTASSIUM: 5.2 meq/L (ref 3.7–5.3)
Sodium: 143 mEq/L (ref 137–147)
TOTAL PROTEIN: 5 g/dL — AB (ref 6.0–8.3)
Total Bilirubin: 0.5 mg/dL (ref 0.3–1.2)

## 2013-12-06 LAB — GLUCOSE, CAPILLARY
GLUCOSE-CAPILLARY: 163 mg/dL — AB (ref 70–99)
GLUCOSE-CAPILLARY: 245 mg/dL — AB (ref 70–99)

## 2013-12-06 LAB — BASIC METABOLIC PANEL
ANION GAP: 18 — AB (ref 5–15)
BUN: 33 mg/dL — ABNORMAL HIGH (ref 6–23)
CHLORIDE: 103 meq/L (ref 96–112)
CO2: 19 meq/L (ref 19–32)
CREATININE: 4 mg/dL — AB (ref 0.50–1.35)
Calcium: 6.8 mg/dL — ABNORMAL LOW (ref 8.4–10.5)
GFR calc non Af Amer: 14 mL/min — ABNORMAL LOW (ref >90)
GFR, EST AFRICAN AMERICAN: 16 mL/min — AB (ref >90)
Glucose, Bld: 250 mg/dL — ABNORMAL HIGH (ref 70–99)
POTASSIUM: 4.9 meq/L (ref 3.7–5.3)
Sodium: 140 mEq/L (ref 137–147)

## 2013-12-06 LAB — D-DIMER, QUANTITATIVE: D-Dimer, Quant: 7.69 ug/mL-FEU — ABNORMAL HIGH (ref 0.00–0.48)

## 2013-12-06 LAB — TROPONIN I: Troponin I: <0.3 ng/mL (ref <0.30)

## 2013-12-06 LAB — HEMOGLOBIN A1C
Hgb A1c MFr Bld: 5 % (ref <5.7)
Mean Plasma Glucose: 97 mg/dL (ref <117)

## 2013-12-06 MED ORDER — RENA-VITE PO TABS
1.0000 | ORAL_TABLET | Freq: Every day | ORAL | Status: DC
  Administered 2013-12-06 – 2013-12-08 (×3): 1 via ORAL
  Administered 2013-12-09: 22:00:00 via ORAL
  Filled 2013-12-06 (×6): qty 1

## 2013-12-06 MED ORDER — HEPARIN BOLUS VIA INFUSION
4000.0000 [IU] | Freq: Once | INTRAVENOUS | Status: AC
  Administered 2013-12-06: 4000 [IU] via INTRAVENOUS
  Filled 2013-12-06: qty 4000

## 2013-12-06 MED ORDER — HEPARIN (PORCINE) IN NACL 100-0.45 UNIT/ML-% IJ SOLN
900.0000 [IU]/h | INTRAMUSCULAR | Status: DC
  Administered 2013-12-06: 1000 [IU]/h via INTRAVENOUS
  Administered 2013-12-07: 900 [IU]/h via INTRAVENOUS
  Filled 2013-12-06 (×4): qty 250

## 2013-12-06 MED ORDER — HYDROCODONE-ACETAMINOPHEN 5-325 MG PO TABS
1.0000 | ORAL_TABLET | Freq: Once | ORAL | Status: AC
Start: 2013-12-06 — End: 2013-12-06
  Administered 2013-12-06: 1 via ORAL
  Filled 2013-12-06: qty 1

## 2013-12-06 MED ORDER — MIDODRINE HCL 5 MG PO TABS
10.0000 mg | ORAL_TABLET | Freq: Three times a day (TID) | ORAL | Status: DC
  Administered 2013-12-06 – 2013-12-10 (×11): 10 mg via ORAL
  Filled 2013-12-06 (×15): qty 2

## 2013-12-06 NOTE — Progress Notes (Signed)
Inpatient Diabetes Program Recommendations  AACE/ADA: New Consensus Statement on Inpatient Glycemic Control (2013)  Target Ranges:  Prepandial:   less than 140 mg/dL      Peak postprandial:   less than 180 mg/dL (1-2 hours)      Critically ill patients:  140 - 180 mg/dL   Results for Scott [name], Scott [name] (MRN [id number]) as of 12/06/2013 10:56  Ref. Range 12/05/2013 16:40 12/05/2013 19:42 12/06/2013 07:31  Glucose-Capillary Latest Range: 70-99 mg/dL 167 (H) 247 (H) 163 (H)   Diabetes history: No Outpatient Diabetes medications: NA Current orders for Inpatient glycemic control: None  Inpatient Diabetes Program Recommendations Correction (SSI): Noted elevated glucose likely due to Decadron. While inpatient, please consider ordering CBGs with Novolog correction ACHS.  Thanks, Barnie Alderman, RN, MSN, CCRN Diabetes Coordinator Inpatient Diabetes Program [phone] (Team Pager) [phone] ([facility] office) [phone] [facility] office)

## 2013-12-06 NOTE — Progress Notes (Signed)
OT Cancellation Note  Patient Details Name: Scott [name] MRN: [id number] DOB: 1940/06/16   Cancelled Treatment:     OT eval/treatment cancelled. Pt at HD today. OT will follow-up to complete evaluation as available.   Villa Herb M 12/06/2013, 2:03 PM  Cyndie Chime, OTR/L Occupational Therapist [phone]

## 2013-12-06 NOTE — Consult Note (Signed)
[facility] Renal Consultation Note  Indication for Consultation:  Management of ESRD/hemodialysis; anemia, hypertension/volume and secondary hyperparathyroidism  HPI: Scott [name] is a 73 y.o. male admitted from [facility] ER with PNA. He presented with sob/ cough / weakness yesterday to [facility]  ER hypoxic  with CXR" Left bibasilar atelectasis or infiltrate. No pulmonary edema. And PH 7.5, PCO 2 28, Po2 66". Wife in room gives most of history, Dr. Bobby Rumpf Treating for MM at [facility] center. Noted ho AMA recently 9/12-13/2015 when admitted with PNA .Last HD on schedule Monday  In [facility] center/has a RUA AVF almost ready to use But Dr. Justin Mend currently holding use sec to pt FTT  Situation with MM. Wife denies decr appetite, chest pain, N/V,fevers, chills.    Past Medical History  Diagnosis Date  . Pacemaker   . Shortness of breath     "when I take one of my chemo pills" (03/17/2013)  . Protein calorie malnutrition   . History of blood transfusion X 1    "count was too low"   . Cataract   . Hypertension   . COPD (chronic obstructive pulmonary disease)   . Pneumonia 06/09/2013; 08/2013; 10/2013; 11/2013  . Type II diabetes mellitus     no meds  . Arthritis     "fingers" (12/05/2013)  . ESRD (end stage renal disease) on dialysis     "MWF; [place]" (12/05/2013)  . Multiple myeloma   . Skin cancer of face     "once; left side of my face" (12/05/2013)    Past Surgical History  Procedure Laterality Date  . Appendectomy    . Partial nephrectomy Right 2000's    "cut out ~ 1/2"   . Insert / replace / remove pacemaker  07/2012  . Bascilic vein transposition Right 07/10/2013    Procedure: BASILIC VEIN TRANSPOSITION- FIRST STAGE ;  Surgeon: Conrad , MD;  Location: [place];  Service: Vascular;  Laterality: Right;  . Insertion of dialysis catheter Right 08/28/2013    Procedure: INSERTION OF DIALYSIS CATHETER in Right Internal Jugular;  Surgeon: [name], MD;  Location: [place];   Service: Vascular;  Laterality: Right;  . Bascilic vein transposition Right 09/01/2013    Procedure: BASILIC VEIN TRANSPOSITION - 2ND STAGE AVF CREATION;  Surgeon: [name], MD;  Location: [place];  Service: Vascular;  Laterality: Right;  . Av fistula placement        Family History  Problem Relation Age of Onset  . Diabetes Mellitus II Father       reports that he has been smoking Cigarettes.  He has a 15 pack-year smoking history. He has never used smokeless tobacco. He reports that he does not drink alcohol or use illicit drugs.  No Known Allergies  Prior to Admission medications   Medication Sig Start Date End Date Taking? Authorizing Provider  acyclovir (ZOVIRAX) 400 MG tablet Take 400 mg by mouth daily.   Yes Historical Provider, MD  amiodarone (PACERONE) 200 MG tablet Take 200 mg by mouth daily.   Yes Historical Provider, MD  dexamethasone (DECADRON) 4 MG tablet Take 40 mg by mouth every 7 (seven) days. Every Tuesday.   Yes Historical Provider, MD  ferrous sulfate 325 (65 FE) MG tablet Take 325 mg by mouth daily with breakfast.   Yes Historical Provider, MD  HYDROcodone-acetaminophen (NORCO/VICODIN) 5-325 MG per tablet Take 1 tablet by mouth every 6 (six) hours as needed for moderate pain.  Yes Historical Provider, MD  ipratropium-albuterol (DUONEB) 0.5-2.5 (3) MG/3ML SOLN Take 3 mLs by nebulization every 4 (four) hours as needed (for shortness of breath).   Yes Historical Provider, MD  levofloxacin (LEVAQUIN) 500 MG tablet Take 1 tablet (500 mg total) by mouth daily. 11/26/13  Yes Reyne Dumas, MD  megestrol (MEGACE) 40 MG/ML suspension Take 800 mg by mouth daily.    Yes Historical Provider, MD  midodrine (PROAMATINE) 10 MG tablet Take 1 tablet (10 mg total) by mouth 3 (three) times daily with meals. 09/07/13  Yes Reyne Dumas, MD  tamsulosin (FLOMAX) 0.4 MG CAPS capsule Take 0.4 mg by mouth daily.   Yes Historical Provider, MD  ACCU-CHEK AVIVA PLUS test strip 1 each by Other  route See admin instructions. Check blood sugar once daily. 05/31/13   Historical Provider, MD  ACCU-CHEK SOFTCLIX LANCETS lancets 1 each by Other route See admin instructions. Check blood sugar once daily. 05/31/13   Historical Provider, MD     Anti-infectives   Start     Dose/Rate Route Frequency Ordered Stop   12/06/13 1200  vancomycin (VANCOCIN) IVPB 750 mg/150 ml premix     750 mg 150 mL/hr over 60 Minutes Intravenous Every M-W-F (Hemodialysis) 12/05/13 1805     12/05/13 2000  vancomycin (VANCOCIN) 1,500 mg in sodium chloride 0.9 % 500 mL IVPB     1,500 mg 250 mL/hr over 120 Minutes Intravenous  Once 12/05/13 1805 12/05/13 2333   12/05/13 1930  ceFEPIme (MAXIPIME) 2 g in dextrose 5 % 50 mL IVPB     2 g 100 mL/hr over 30 Minutes Intravenous Every M-W-F (Hemodialysis) 12/05/13 1803     12/05/13 1830  acyclovir (ZOVIRAX) 200 MG capsule 400 mg     400 mg Oral Daily 12/05/13 1733     12/05/13 1800  acyclovir (ZOVIRAX) tablet 400 mg  Status:  Discontinued     400 mg Oral Daily 12/05/13 1721 12/05/13 1733      Results for orders placed during the hospital encounter of 12/05/13 (from the past 48 hour(s))  MRSA PCR SCREENING     Status: None   Collection Time    12/05/13  4:35 PM      Result Value Ref Range   MRSA by PCR NEGATIVE  NEGATIVE   Comment:            The GeneXpert MRSA Assay (FDA     approved for NASAL specimens     only), is one component of a     comprehensive MRSA colonization     surveillance program. It is not     intended to diagnose MRSA     infection nor to guide or     monitor treatment for     MRSA infections.  GLUCOSE, CAPILLARY     Status: Abnormal   Collection Time    12/05/13  4:40 PM      Result Value Ref Range   Glucose-Capillary 167 (*) 70 - 99 mg/dL   Comment 1 Documented in Chart    TROPONIN I     Status: None   Collection Time    12/05/13  6:46 PM      Result Value Ref Range   Troponin I <0.30  <0.30 ng/mL   Comment:            Due to the  release kinetics of cTnI,     a negative result within the first hours     of the onset  of symptoms does not rule out     myocardial infarction with certainty.     If myocardial infarction is still suspected,     repeat the test at appropriate intervals.  GLUCOSE, CAPILLARY     Status: Abnormal   Collection Time    12/05/13  7:42 PM      Result Value Ref Range   Glucose-Capillary 247 (*) 70 - 99 mg/dL  TROPONIN I     Status: None   Collection Time    12/05/13 11:03 PM      Result Value Ref Range   Troponin I <0.30  <0.30 ng/mL   Comment:            Due to the release kinetics of cTnI,     a negative result within the first hours     of the onset of symptoms does not rule out     myocardial infarction with certainty.     If myocardial infarction is still suspected,     repeat the test at appropriate intervals.  BASIC METABOLIC PANEL     Status: Abnormal   Collection Time    12/05/13 11:03 PM      Result Value Ref Range   Sodium 140  137 - 147 mEq/L   Potassium 4.9  3.7 - 5.3 mEq/L   Chloride 103  96 - 112 mEq/L   CO2 19  19 - 32 mEq/L   Glucose, Bld 250 (*) 70 - 99 mg/dL   BUN 33 (*) 6 - 23 mg/dL   Creatinine, Ser 4.00 (*) 0.50 - 1.35 mg/dL   Calcium 6.8 (*) 8.4 - 10.5 mg/dL   GFR calc non Af Amer 14 (*) >90 mL/min   GFR calc Af Amer 16 (*) >90 mL/min   Comment: (NOTE)     The eGFR has been calculated using the CKD EPI equation.     This calculation has not been validated in all clinical situations.     eGFR's persistently <90 mL/min signify possible Chronic Kidney     Disease.   Anion gap 18 (*) 5 - 15  COMPREHENSIVE METABOLIC PANEL     Status: Abnormal   Collection Time    12/06/13  4:37 AM      Result Value Ref Range   Sodium 143  137 - 147 mEq/L   Potassium 5.2  3.7 - 5.3 mEq/L   Chloride 103  96 - 112 mEq/L   CO2 23  19 - 32 mEq/L   Glucose, Bld 150 (*) 70 - 99 mg/dL   BUN 39 (*) 6 - 23 mg/dL   Creatinine, Ser 4.75 (*) 0.50 - 1.35 mg/dL   Calcium 7.9 (*) 8.4  - 10.5 mg/dL   Total Protein 5.0 (*) 6.0 - 8.3 g/dL   Albumin 1.9 (*) 3.5 - 5.2 g/dL   AST 17  0 - 37 U/L   ALT 12  0 - 53 U/L   Alkaline Phosphatase 76  39 - 117 U/L   Total Bilirubin 0.5  0.3 - 1.2 mg/dL   GFR calc non Af Amer 11 (*) >90 mL/min   GFR calc Af Amer 13 (*) >90 mL/min   Comment: (NOTE)     The eGFR has been calculated using the CKD EPI equation.     This calculation has not been validated in all clinical situations.     eGFR's persistently <90 mL/min signify possible Chronic Kidney     Disease.   Anion gap 17 (*)  5 - 15  CBC     Status: Abnormal   Collection Time    12/06/13  4:37 AM      Result Value Ref Range   WBC 13.5 (*) 4.0 - 10.5 K/uL   RBC 4.08 (*) 4.22 - 5.81 MIL/uL   Hemoglobin 11.3 (*) 13.0 - 17.0 g/dL   HCT 36.4 (*) 39.0 - 52.0 %   MCV 89.2  78.0 - 100.0 fL   MCH 27.7  26.0 - 34.0 pg   MCHC 31.0  30.0 - 36.0 g/dL   RDW 19.8 (*) 11.5 - 15.5 %   Platelets 127 (*) 150 - 400 K/uL  TROPONIN I     Status: None   Collection Time    12/06/13  4:37 AM      Result Value Ref Range   Troponin I <0.30  <0.30 ng/mL   Comment:            Due to the release kinetics of cTnI,     a negative result within the first hours     of the onset of symptoms does not rule out     myocardial infarction with certainty.     If myocardial infarction is still suspected,     repeat the test at appropriate intervals.  GLUCOSE, CAPILLARY     Status: Abnormal   Collection Time    12/06/13  7:31 AM      Result Value Ref Range   Glucose-Capillary 163 (*) 70 - 99 mg/dL   Comment 1 Documented in Chart       Ros see hpi for positives  Physical Exam: Filed Vitals:   12/06/13 0729  BP: 159/60  Pulse: 74  Temp: 98.6 F (37 C)  Resp: 20     General: alert Chronically ill wm,NAD, disoriented slightly to place /time= "[facility] , mon, 21st" HEENT: L cheek/lower orbital ecchymosis ( prior fall at home)/ non icteric Neck: no jvd Heart: RRR no mur, rub or gallop/  Lungs:  Decr Bs at bases bilat/  Abdomen: bs pos. ssl obese , soft nt ,nd Extremities: 2+ bipedal edema Skin: scattered upper extrem ecchymotic mild excoriations  Neuro: moves all extrm .bilat upper extrem  Fine tremors/ confused pleasantly Dialysis Access: R IJ perm cath/ R UA AVF pos. Bruit   Dialysis Orders: Center: [facility]   on MWF . EDW 63 HD Bath 3k/2.25 ca  Time 4hr Heparin 2000. Access R IJ PC/ RBVT AVF("not using AVF per Dr. Justin Mend currently sec to FTT)  BFR 400 DFR 800/   Vit =0 aranesp 150 qwed HD  Venofer  0   Assessment/Plan 1. Hypoxemia - ABG pO2 was 66, on IV abx for possible PNA, CXR not impressive, no fever. VQ scan pending 2. MM- on meds 3. FTT- 5th admit with PNA this year 4. ESRD -  HD mwf 5. Hypertension/volume  - bp 6  Using leg cuff HOWEVER/ on Midodrine for bp support pre hd  Hold for sbp >140 6. Anemia  -hgb 11.3 no esa / op TSat 72% no iv fe / hold po Fe 7. Metabolic bone disease -  No binders with last op phos 2/ pth 211 no vit d 8. Nutrition - 1.9 alb   Use breeze supplement / renal dm diet and renal vit/ on megace 9. Dysrhythmia - on amiodarone / has pacemaker 10. DM type 2 - per admit  Ernest Haber, PA-C [place] [phone] 12/06/2013, 10:20 AM   Pt  seen, examined and agree w A/P as above.   Kelly Splinter MD pager [phone]    cell [phone] 12/06/2013, 1:35 PM

## 2013-12-06 NOTE — Progress Notes (Signed)
Utilization Review Completed.  

## 2013-12-06 NOTE — Procedures (Signed)
I was present at this dialysis session, have reviewed the session itself and made  appropriate changes  Kelly Splinter MD (pgr) [phone]    [phone] 12/06/2013, 2:45 PM

## 2013-12-06 NOTE — Progress Notes (Signed)
ANTICOAGULATION CONSULT NOTE - Initial Consult  Pharmacy Consult for heparin Indication: Rule out PE  No Known Allergies  Patient Measurements: Height: 5' 4.96" (165 cm) Weight: 135 lb 9.3 oz (61.5 kg) IBW/kg (Calculated) : 61.41 Heparin Dosing Weight: 61kg  Vital Signs: Temp: 97.6 F (36.4 C) (09/23 1744) Temp src: Oral (09/23 1744) BP: 109/60 mmHg (09/23 1744) Pulse Rate: 83 (09/23 1744)  Labs:  Recent Labs  12/05/13 1846 12/05/13 2303 12/06/13 0437  HGB  --   --  11.3*  HCT  --   --  36.4*  PLT  --   --  127*  CREATININE  --  4.00* 4.75*  TROPONINI <0.30 <0.30 <0.30    Estimated Creatinine Clearance: 12.4 ml/min (by C-G formula based on Cr of 4.75).   Medical History: Past Medical History  Diagnosis Date  . Pacemaker   . Shortness of breath     "when I take one of my chemo pills" (03/17/2013)  . Protein calorie malnutrition   . History of blood transfusion X 1    "count was too low"   . Cataract   . Hypertension   . COPD (chronic obstructive pulmonary disease)   . Pneumonia 06/09/2013; 08/2013; 10/2013; 11/2013  . Type II diabetes mellitus     no meds  . Arthritis     "fingers" (12/05/2013)  . ESRD (end stage renal disease) on dialysis     "MWF; [place]" (12/05/2013)  . Multiple myeloma   . Skin cancer of face     "once; left side of my face" (12/05/2013)    Medications:  See EMR  Assessment: 73 year male patient transferred from [facility] emergency room with pneumonia and shortness of breath. Orders to start IV heparin while DVT/PE is being ruled out. Patient not on anticoagulants prior to admission. Sq heparin was given @0600  this am but has now been discontinued.  Goal of Therapy:  Heparin level 0.3-0.7 units/ml Monitor platelets by anticoagulation protocol: Yes   Plan:  Give 4000 units bolus x 1 Start heparin infusion at 1000 units/hr Check anti-Xa level in 8 hours and daily while on heparin Continue to monitor H&H and  platelets  Erin Hearing PharmD., BCPS Clinical Pharmacist Pager [phone] 12/06/2013 6:29 PM

## 2013-12-06 NOTE — Consult Note (Addendum)
WOC wound consult note  Reason for Consult: Pt familiar to [facility] team from previous admission; refer to progress notes on 9/12. 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 Discussed plan of care with patient and wife at bedside. Pt states this treatment was too painful for him to tolerate and it was discontinued several days ago.  Home health has been using a Silver Dressing.   Wound type: Chronic stage 4  Pressure Ulcer POA: Yes  Measurement :2.2X2X.8cm with undermining to 1 cm at 12:00 o'clock   Wound bed: 15% yellow, 85% red, bone palpable with swab  Drainage (amount, consistency, odor) Small amt yellow drainage, no odor  Periwound: Intact skin surrounding Dressing procedure/placement/frequency: Aquacel to provide antimicrobial benefits and absorb drainage.  Foam dressing to protect. Pt declines offer of air mattress replacement for bed. Please re-consult if further assistance is needed.  Thank-you,  Julien Girt MSN, [place], [place], [place], [place]

## 2013-12-06 NOTE — Progress Notes (Signed)
Attempted to call report to RN on 3E.  RN was unavailable at the time and will call when she can take report.  Patient will be going to dialysis prior to going to [facility].  He is stable and able to transfer off the unit when appropriate.  Will continue to monitor.

## 2013-12-06 NOTE — Progress Notes (Signed)
[facility]  MIKEN STECHER TKW:[id number] DOB: 1940/04/17 DOA: 12/05/2013 PCP: No primary provider on file.  Brief narrative: 73 year male patient transferred from [facility] emergency room with pneumonia. He presented with shortness of breath, cough, weakness and was found to be hypoxic. Chest x-ray noted left bibasilar atelectasis or platelike infiltrate but no pulmonary edema. Patient recently hospitalized for pneumonia from 9/12 to 9/13 but left AMA but apparently did receive antibiotics which he reportedly had continued to take prior to this admission. He had not missed any hemodialysis treatments. He was not normally on oxygen at home.  After arrival to [facility] he appeared comfortable on 2 L of oxygen. He was noted to have bilateral lower extremity edema greater on the left side concerning or possible DVT with associated PE in the differential. He was hemodynamically stable. He did have a leukocytosis of 13,000.  Since admission had discussion with patient and wife at bedside. Patient's baseline mobility is out of bed to wheelchair with walker in front of him and he uses the walker to propel himself while seated in the wheelchair. Patient seems very unhappy with his current clinical status but defers to his wife in regards to making all medical decisions. States is scheduled to see his Oncologist on October 1. Again asking when he can return to home. Explained to patient and wife rationale for Dopplers and VQ scan. Also discussed with them the possibility that patient may benefit from home O2 if he qualifies.  HPI/Subjective: No shortness of breath at rest. No chest pain. Asking when he can he go home.  Assessment/Plan:  Acute respiratory failure with hypoxia / ? HCAP / known grade 3 diastolic dysfunction Continue empiric antibiotics - chest x-ray with minimal changes consistent with pneumonia - agree with ruling out pulmonary embolism and  given med to high pretest prob (describes sudden onset in sob after moving form chair where he sat to bed a very short distance away) will cover empirically w/ full anticoag until further testing available - continue supportive care - Echo in June revealed reversible restrictive pattern of grade 3 diastolic dysfunction and mild pulmonary hypertension 35 mmHg    CKD stage V requiring chronic dialysis Nephrology consulted - dialyzes MWF - new start in June  Diet controlled diabetes mellitus CBGs are running slightly elevated during acute illness - has been chronically on Decadron 40 mg once weekly so doubt this is contributing - will check hemoglobin A1c  ? History of ventricular fibrillation Noted to be on amiodarone - x-ray reveals AICD - echocardiogram June 2015 with preserved LV function and grade 3 diastolic dysfunction  Bilateral lower extremity edema Venous duplex studies pending to rule out DVT - likely due to diastolic CFH   Physical deconditioning PT/OT evaluations pending - will also obtain room air ambulatory and resting saturations to determine if patient candidate for home oxygen  Multiple myeloma Currently on weekly Decadron as well as Zovirax - wife states next appointment with oncologist is October 1st  Decubitus ulcer of sacral region, stage 4 Chronic - WOC RN consult  DVT prophylaxis:  Code Status: Full Family Communication: Wife and patient at bedside Disposition Plan/Expected LOS: Transfer to telemetry bed and if VQ scan negative for PE can discontinue telemetry  Consultants: Nephrology  Procedures: Bilateral lower extremity duplex pending  Cultures: 9/22 blood cultures x2 pending  Antibiotics: Acyclovir 9/23 >>> Cefepime 9/22 >>> Vancomycin 9/22 >>>  Objective: Blood pressure  162/67, pulse 81, temperature 98 F (36.7 C), temperature source Oral, resp. rate 16, SpO2 98.00%.  Intake/Output Summary (Last 24 hours) at 12/06/13 1131 Last data filed at  12/06/13 1123  Gross per 24 hour  Intake    743 ml  Output    100 ml  Net    643 ml     Exam: Gen: No acute respiratory distress-has a large ecchymosis on left cheek-seems depressed Chest: Clear to auscultation bilaterally without wheezes, rhonchi or crackles but is diminished in bases, 2 L Cardiac: Regular rate and rhythm, S1-S2, no rubs murmurs or gallops, soft spongy 2-3+ bilateral lower extremity peripheral edema (left greater than right), no JVD Abdomen: Soft nontender nondistended without obvious hepatosplenomegaly, no ascites Extremities: Symmetrical in appearance without cyanosis, clubbing or effusion  Scheduled Meds:  Scheduled Meds: . acyclovir  400 mg Oral Daily  . amiodarone  200 mg Oral Daily  . antiseptic oral rinse  7 mL Mouth Rinse BID  . ceFEPime (MAXIPIME) IV  2 g Intravenous Q M,W,F-HD  . dexamethasone  40 mg Oral Q Tue  . docusate sodium  100 mg Oral BID  . heparin  5,000 Units Subcutaneous 3 times per day  . ipratropium-albuterol  3 mL Nebulization TID  . megestrol  800 mg Oral Daily  . midodrine  10 mg Oral TID WC  . multivitamin  1 tablet Oral QHS  . sodium chloride  3 mL Intravenous Q12H  . tamsulosin  0.4 mg Oral Daily  . vancomycin  750 mg Intravenous Q M,W,F-HD   Continuous Infusions:   Data Reviewed: Basic Metabolic Panel:  Recent Labs Lab 12/05/13 2303 12/06/13 0437  NA 140 143  K 4.9 5.2  CL 103 103  CO2 19 23  GLUCOSE 250* 150*  BUN 33* 39*  CREATININE 4.00* 4.75*  CALCIUM 6.8* 7.9*   Liver Function Tests:  Recent Labs Lab 12/06/13 0437  AST 17  ALT 12  ALKPHOS 76  BILITOT 0.5  PROT 5.0*  ALBUMIN 1.9*   No results found for this basename: LIPASE, AMYLASE,  in the last 168 hours No results found for this basename: AMMONIA,  in the last 168 hours CBC:  Recent Labs Lab 12/06/13 0437  WBC 13.5*  HGB 11.3*  HCT 36.4*  MCV 89.2  PLT 127*   Cardiac Enzymes:  Recent Labs Lab 12/05/13 1846 12/05/13 2303  12/06/13 0437  TROPONINI <0.30 <0.30 <0.30   BNP (last 3 results)  Recent Labs  06/08/13 2145 11/04/13 2328  PROBNP 16467.0* 13759.0*   CBG:  Recent Labs Lab 12/05/13 1640 12/05/13 1942 12/06/13 0731  GLUCAP 167* 247* 163*    Recent Results (from the past 240 hour(s))  MRSA PCR SCREENING     Status: None   Collection Time    12/05/13  4:35 PM      Result Value Ref Range Status   MRSA by PCR NEGATIVE  NEGATIVE Final   Comment:            The GeneXpert MRSA Assay (FDA     approved for NASAL specimens     only), is one component of a     comprehensive MRSA colonization     surveillance program. It is not     intended to diagnose MRSA     infection nor to guide or     monitor treatment for     MRSA infections.     Studies:  Recent x-ray studies have been reviewed in detail  by the Attending Physician  Time spent :  [place], ANP [facility] Office  [phone] Pager [phone]  On-Call/Text Page:      [email or web address]      password [email or web address]  If 7PM-7AM, please contact night-coverage [email or web address] Password [facility] 12/06/2013, 11:31 AM   LOS: 1 day   I have personally examined this patient and reviewed the entire database. I have reviewed the above note, made any necessary editorial changes, and agree with its content.  Cherene Altes, MD [facility]

## 2013-12-06 NOTE — Progress Notes (Signed)
Ask to assess right chest HD catheter site, dsg. Clean, dry and intact. Large skin tear noted at edge of dressing on neck. RN to dress and monitor.

## 2013-12-07 ENCOUNTER — Inpatient Hospital Stay (HOSPITAL_COMMUNITY): Payer: Medicare HMO

## 2013-12-07 DIAGNOSIS — C9000 Multiple myeloma not having achieved remission: Secondary | ICD-10-CM

## 2013-12-07 DIAGNOSIS — R609 Edema, unspecified: Secondary | ICD-10-CM

## 2013-12-07 LAB — CBC
HCT: 36.5 % — ABNORMAL LOW (ref 39.0–52.0)
Hemoglobin: 11.7 g/dL — ABNORMAL LOW (ref 13.0–17.0)
MCH: 28.5 pg (ref 26.0–34.0)
MCHC: 32.1 g/dL (ref 30.0–36.0)
MCV: 88.8 fL (ref 78.0–100.0)
Platelets: 85 10*3/uL — ABNORMAL LOW (ref 150–400)
RBC: 4.11 MIL/uL — AB (ref 4.22–5.81)
RDW: 19.5 % — ABNORMAL HIGH (ref 11.5–15.5)
WBC: 20 10*3/uL — ABNORMAL HIGH (ref 4.0–10.5)

## 2013-12-07 LAB — COMPREHENSIVE METABOLIC PANEL
ALT: 12 U/L (ref 0–53)
AST: 18 U/L (ref 0–37)
Albumin: 2 g/dL — ABNORMAL LOW (ref 3.5–5.2)
Alkaline Phosphatase: 76 U/L (ref 39–117)
Anion gap: 16 — ABNORMAL HIGH (ref 5–15)
BILIRUBIN TOTAL: 0.7 mg/dL (ref 0.3–1.2)
BUN: 26 mg/dL — ABNORMAL HIGH (ref 6–23)
CO2: 24 mEq/L (ref 19–32)
Calcium: 8.1 mg/dL — ABNORMAL LOW (ref 8.4–10.5)
Chloride: 99 mEq/L (ref 96–112)
Creatinine, Ser: 3.51 mg/dL — ABNORMAL HIGH (ref 0.50–1.35)
GFR calc Af Amer: 19 mL/min — ABNORMAL LOW (ref >90)
GFR, EST NON AFRICAN AMERICAN: 16 mL/min — AB (ref >90)
Glucose, Bld: 112 mg/dL — ABNORMAL HIGH (ref 70–99)
Potassium: 4.1 mEq/L (ref 3.7–5.3)
Sodium: 139 mEq/L (ref 137–147)
Total Protein: 5 g/dL — ABNORMAL LOW (ref 6.0–8.3)

## 2013-12-07 LAB — HEPARIN LEVEL (UNFRACTIONATED)
Heparin Unfractionated: 0.76 IU/mL — ABNORMAL HIGH (ref 0.30–0.70)
Heparin Unfractionated: 0.35 IU/mL (ref 0.30–0.70)

## 2013-12-07 LAB — URINALYSIS, ROUTINE W REFLEX MICROSCOPIC
BILIRUBIN URINE: NEGATIVE
GLUCOSE, UA: NEGATIVE mg/dL
Ketones, ur: NEGATIVE mg/dL
Leukocytes, UA: NEGATIVE
Nitrite: NEGATIVE
PROTEIN: 100 mg/dL — AB
Specific Gravity, Urine: 1.018 (ref 1.005–1.030)
Urobilinogen, UA: 0.2 mg/dL (ref 0.0–1.0)
pH: 7.5 (ref 5.0–8.0)

## 2013-12-07 LAB — URINE MICROSCOPIC-ADD ON

## 2013-12-07 LAB — GLUCOSE, CAPILLARY: Glucose-Capillary: 118 mg/dL — ABNORMAL HIGH (ref 70–99)

## 2013-12-07 MED ORDER — TECHNETIUM TO 99M ALBUMIN AGGREGATED
6.0000 | Freq: Once | INTRAVENOUS | Status: AC | PRN
  Administered 2013-12-07: 6 via INTRAVENOUS

## 2013-12-07 MED ORDER — INSULIN ASPART 100 UNIT/ML ~~LOC~~ SOLN: 0.0000 [IU] | Freq: Three times a day (TID) | SUBCUTANEOUS | Status: DC

## 2013-12-07 MED ORDER — NEPRO/CARBSTEADY PO LIQD
237.0000 mL | ORAL | Status: DC | PRN
  Filled 2013-12-07: qty 237

## 2013-12-07 MED ORDER — TECHNETIUM TC 99M DIETHYLENETRIAME-PENTAACETIC ACID: 40.0000 | Freq: Once | INTRAVENOUS | Status: AC | PRN

## 2013-12-07 NOTE — Care Management Note (Signed)
    Page 1 of 2   12/08/2013     1:59:31 PM CARE MANAGEMENT NOTE 12/08/2013  Patient:  Scott [name], Scott [name]   Account Number:  [id number]  Date Initiated:  12/07/2013  Documentation initiated by:  GRAVES-BIGELOW,Camisha Srey  Subjective/Objective Assessment:   Pt admitted for Acute respiratory failure with hypoxia. Pt is from home with wife.     Action/Plan:   Pt is currently active with [facility] for RN and PT. Pt will need resumption orders once medically stable for d/c. Monitoring to see if will need DME 02 at d/c.   Anticipated DC Date:  12/10/2013   Anticipated DC Plan:  [place]  CM consult      [facility] Choice  HOME HEALTH   Choice offered to / List presented to:  C-1 Patient   DME arranged  [facility]      DME agency  [place] arranged  HH-1 RN  [place] agency  [place]   Status of service:  In process, will continue to follow Medicare Important Message given?  YES (If response is "NO", the following Medicare IM given date fields will be blank) Date Medicare IM given:  12/08/2013 Medicare IM given by:  GRAVES-BIGELOW,Kelani Robart Date Additional Medicare IM given:   Additional Medicare IM given by:    Discharge Disposition:  [place]  Per UR Regulation:  Reviewed for med. necessity/level of care/duration of stay  If discussed at [place] of Stay Meetings, dates discussed:    Comments:   12-08-13 1353 Jacqlyn Krauss, RN,BSN [phone] CM to fax orders to [facility] for resumption of care. DME 02 to be supplied via [place]. DME to be delivered to room before d/c.  12-05-13 [place], RN,BSN [place] Fax: [phone]  if pt is d/c over the weekend. No further needs at this time.

## 2013-12-07 NOTE — Progress Notes (Signed)
Ddimer elevated, but pt is already on heparin for PE tx and is undergoing a Pulm/Vent test this am.

## 2013-12-07 NOTE — Progress Notes (Signed)
  [facility] Progress Note   Subjective: Upset about dialysis yesterday, will not give any details.  No report of any problems in dialysis.   Filed Vitals:   12/06/13 2112 12/07/13 0548 12/07/13 0845 12/07/13 1353  BP: 134/58 120/53    Pulse: 85 70    Temp: 97.5 F (36.4 C) 98.6 F (37 C)    TempSrc: Oral Oral    Resp: 18 18    Height:      Weight:  64.592 kg (142 lb 6.4 oz)    SpO2: 97% 91% 95% 97%   Exam: Elderly WM no distress No jvd Chest is clear on L, rales R side 1/3 up RRR no MRG Abd soft, NTND No LE edema Neuro is alert, nf R IJ perm cath w clean exit site, RUA AVF +bruit  HD: MWF [place] 4h  63kg   3K/2.25 Bath  Heparin 2000   R IJ cath / RUA BVT ("not using AVF per Dr Justin Mend currently sec to FTT) 400/800 Aranesp 150 ug q Wed, Venofer none, Vit D none       Assessment: 1 Acute resp failure / possible PNA 2 ESRD on HD 3 Mult myeloma - on decadron only here 4 Sacral decub, stage IV 5 Anemia cont aranesp 6 HPTH no vit D, ? Binder, check P 7 Afib on amio 8 Chronic hypotension on midodrine   Plan- HD tomorrow, UF to 62kg    Kelly Splinter MD  pager [phone]    cell [phone]  12/07/2013, 5:10 PM     Recent Labs Lab 12/05/13 2303 12/06/13 0437 12/07/13 0628  NA 140 143 139  K 4.9 5.2 4.1  CL 103 103 99  CO2 19 23 24   GLUCOSE 250* 150* 112*  BUN 33* 39* 26*  CREATININE 4.00* 4.75* 3.51*  CALCIUM 6.8* 7.9* 8.1*    Recent Labs Lab 12/06/13 0437 12/07/13 0628  AST 17 18  ALT 12 12  ALKPHOS 76 76  BILITOT 0.5 0.7  PROT 5.0* 5.0*  ALBUMIN 1.9* 2.0*    Recent Labs Lab 12/06/13 0437 12/07/13 0500  WBC 13.5* 20.0*  HGB 11.3* 11.7*  HCT 36.4* 36.5*  MCV 89.2 88.8  PLT 127* 85*   . acyclovir  400 mg Oral Daily  . amiodarone  200 mg Oral Daily  . antiseptic oral rinse  7 mL Mouth Rinse BID  . ceFEPime (MAXIPIME) IV  2 g Intravenous Q M,W,F-HD  . dexamethasone  40 mg Oral Q Tue  . docusate sodium  100 mg Oral BID  .  ipratropium-albuterol  3 mL Nebulization TID  . megestrol  800 mg Oral Daily  . midodrine  10 mg Oral TID WC  . multivitamin  1 tablet Oral QHS  . sodium chloride  3 mL Intravenous Q12H  . tamsulosin  0.4 mg Oral Daily  . vancomycin  750 mg Intravenous Q M,W,F-HD   . heparin 900 Units/hr (12/07/13 0859)   sodium chloride, acetaminophen, acetaminophen, albuterol, HYDROcodone-acetaminophen, sodium chloride, technetium TC 20M diethylenetriame-pentaacetic acid

## 2013-12-07 NOTE — Progress Notes (Signed)
[facility] PROGRESS NOTE  Scott [name] QTM:[id number] DOB: Mar 21, 1940 DOA: 12/05/2013 PCP: No primary provider on file.  Brief narrative:  73 year male patient transferred from [facility] emergency room with pneumonia. He presented with shortness of breath, cough, weakness and was found to be hypoxic. Chest x-ray noted left bibasilar atelectasis or platelike infiltrate but no pulmonary edema. Patient recently hospitalized for pneumonia from 9/12 to 9/13 but left AMA but apparently did receive antibiotics which he reportedly had continued to take prior to this admission. He had not missed any hemodialysis treatments. He was not normally on oxygen at home.  After arrival to [facility] he appeared comfortable on 2 L of oxygen. He was noted to have bilateral lower extremity edema greater on the left side concerning or possible DVT with associated PE in the differential. He was hemodynamically stable. He did have a leukocytosis of 13,000.  Since admission Dr. Thereasa Solo had discussion with patient and wife at bedside. Patient's baseline mobility is out of bed to wheelchair with walker in front of him and he uses the walker to propel himself while seated in the wheelchair. Patient seems very unhappy with his current clinical status but defers to his wife in regards to making all medical decisions. States is scheduled to see his Oncologist on October 1. Again asking when he can return to home. Explained to patient and wife rationale for Dopplers and VQ scan. Also discussed with them the possibility that patient may benefit from home O2 if he qualifies.        Assessment/Plan: #1 Acute Respiratory Failure with hypoxia/?HCAP/Grade 3 diastolic dysfunction Clinical improvement. V/Q scan with low probability for PE. 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 Continue empiric IV antibiotics. Continue IV heparin for  now. Will discuss the above findings with hematology in the morning.  #2 chronic kidney disease stage V on hemodialysis  patient is being followed by nephrology.  #3 diabetes mellitus Patient has been on Decadron once weekly. Hemoglobin A1c 5. CBGs are ranging from 163-247. We'll place on sliding scale insulin.  #4 history of V. Fib On  amiodarone. X-ray reveals AICD. 2-D echo June 2015 with present LV function and grade 3 diastolic dysfunction. Follow.  #5 bilateral lower extremity edema Lower extremity Dopplers with old DVT noted on the left. Patient currently on IV heparin. Will discuss these findings with hematology.  #6 history of multiple myeloma Continue weekly Decadron and Zovirax.  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 Continue recommendations a wound care nurse.  #8 chronic right DVT Per LE doppler. Patient with history of multiple myeloma, currently on IV heparin. Will discuss with hematology tomorrow likely needs anticoagulation.      Code Status: Full Family Communication: Updated patient and family at bedside. Disposition Plan: Home when medically stable.   Consultants:  None  Procedures:  Lower extremity Dopplers 12/07/2013  VQ scan 12/07/2013  X-ray 12/05/2013  Antibiotics: Acyclovir 9/23 >>>  Cefepime 9/22 >>>  Vancomycin 9/22 >>>   HPI/Subjective: Patient states shortness of breath has improved. No complaints.  Objective: Filed Vitals:   12/07/13 0548  BP: 120/53  Pulse: 70  Temp: 98.6 F (37 C)  Resp: 18    Intake/Output Summary (Last 24 hours) at 12/07/13 1415 Last data filed at 12/07/13 0900  Gross per 24 hour  Intake    609 ml  Output   3200  ml  Net  -2591 ml   Filed Weights   12/06/13 1214 12/06/13 1627 12/07/13 0548  Weight: 65.3 kg (143 lb 15.4 oz) 61.5 kg (135 lb 9.3 oz) 64.592 kg (142 lb 6.4 oz)    Exam:   General:  NAD  Cardiovascular: RRR  Respiratory: CTAB  Abdomen: Soft,  nontender, nondistended, positive bowel sounds  Musculoskeletal: No clubbing cyanosis or edema  Data Reviewed: Basic Metabolic Panel:  Recent Labs Lab 12/05/13 2303 12/06/13 0437 12/07/13 0628  NA 140 143 139  K 4.9 5.2 4.1  CL 103 103 99  CO2 19 23 24   GLUCOSE 250* 150* 112*  BUN 33* 39* 26*  CREATININE 4.00* 4.75* 3.51*  CALCIUM 6.8* 7.9* 8.1*   Liver Function Tests:  Recent Labs Lab 12/06/13 0437 12/07/13 0628  AST 17 18  ALT 12 12  ALKPHOS 76 76  BILITOT 0.5 0.7  PROT 5.0* 5.0*  ALBUMIN 1.9* 2.0*   No results found for this basename: LIPASE, AMYLASE,  in the last 168 hours No results found for this basename: AMMONIA,  in the last 168 hours CBC:  Recent Labs Lab 12/06/13 0437 12/07/13 0500  WBC 13.5* 20.0*  HGB 11.3* 11.7*  HCT 36.4* 36.5*  MCV 89.2 88.8  PLT 127* 85*   Cardiac Enzymes:  Recent Labs Lab 12/05/13 1846 12/05/13 2303 12/06/13 0437  TROPONINI <0.30 <0.30 <0.30   BNP (last 3 results)  Recent Labs  06/08/13 2145 11/04/13 2328  PROBNP 16467.0* 13759.0*   CBG:  Recent Labs Lab 12/05/13 1640 12/05/13 1942 12/06/13 0731 12/06/13 1122  GLUCAP 167* 247* 163* 245*    Recent Results (from the past 240 hour(s))  MRSA PCR SCREENING     Status: None   Collection Time    12/05/13  4:35 PM      Result Value Ref Range Status   MRSA by PCR NEGATIVE  NEGATIVE Final   Comment:            The GeneXpert MRSA Assay (FDA     approved for NASAL specimens     only), is one component of a     comprehensive MRSA colonization     surveillance program. It is not     intended to diagnose MRSA     infection nor to guide or     monitor treatment for     MRSA infections.  CULTURE, BLOOD (ROUTINE X 2)     Status: None   Collection Time    12/05/13  6:40 PM      Result Value Ref Range Status   Specimen Description BLOOD LEFT ARM   Final   Special Requests BOTTLES DRAWN AEROBIC AND ANAEROBIC 10CC   Final   Culture  Setup Time     Final    Value: 12/06/2013 00:50     Performed at [facility]   Culture     Final   Value:        BLOOD CULTURE RECEIVED NO GROWTH TO DATE CULTURE WILL BE HELD FOR 5 DAYS BEFORE ISSUING A FINAL NEGATIVE REPORT     Performed at [facility]   Report Status PENDING   Incomplete  CULTURE, BLOOD (ROUTINE X 2)     Status: None   Collection Time    12/05/13  6:54 PM      Result Value Ref Range Status   Specimen Description BLOOD LEFT ARM   Final   Special Requests     Final  Value: BOTTLES DRAWN AEROBIC AND ANAEROBIC BLUE 10CC RED 5CC   Culture  Setup Time     Final   Value: 12/06/2013 00:50     Performed at [facility]   Culture     Final   Value:        BLOOD CULTURE RECEIVED NO GROWTH TO DATE CULTURE WILL BE HELD FOR 5 DAYS BEFORE ISSUING A FINAL NEGATIVE REPORT     Performed at [facility]   Report Status PENDING   Incomplete     Studies: Dg Chest 2 View  12/05/2013   CLINICAL DATA:  Increased shortness of breath  EXAM: CHEST  2 VIEW  COMPARISON:  12/05/2013  FINDINGS: There are low lung volumes. There is a dual ligament right-sided central venous catheter in unchanged position. There is a dual lead AICD. There is no focal parenchymal opacity, pleural effusion, or pneumothorax. The heart and mediastinal contours are unremarkable.  The osseous structures are unremarkable.  IMPRESSION: No active cardiopulmonary disease.   Electronically Signed   By: Kathreen Devoid   On: 12/05/2013 20:17    Scheduled Meds: . acyclovir  400 mg Oral Daily  . amiodarone  200 mg Oral Daily  . antiseptic oral rinse  7 mL Mouth Rinse BID  . ceFEPime (MAXIPIME) IV  2 g Intravenous Q M,W,F-HD  . dexamethasone  40 mg Oral Q Tue  . docusate sodium  100 mg Oral BID  . ipratropium-albuterol  3 mL Nebulization TID  . megestrol  800 mg Oral Daily  . midodrine  10 mg Oral TID WC  . multivitamin  1 tablet Oral QHS  . sodium chloride  3 mL Intravenous Q12H  . tamsulosin  0.4 mg Oral Daily   . vancomycin  750 mg Intravenous Q M,W,F-HD   Continuous Infusions: . heparin 900 Units/hr (12/07/13 0859)    Active Problems:   Multiple myeloma   HCAP (healthcare-associated pneumonia)   Acute respiratory failure with hypoxia   CKD (chronic kidney disease) stage V requiring chronic dialysis   Decubitus ulcer of sacral region, stage 4   Bilateral lower extremity edema   Physical deconditioning    Time spent: Hayes MD [facility] Pager [phone]. If 7PM-7AM, please contact night-coverage at [email or web address], password [facility] 12/07/2013, 2:15 PM  LOS: 2 days

## 2013-12-07 NOTE — Progress Notes (Signed)
Patient D-dimer elevated, Np K.Kirby made aware, no new orders given. Will continue to monitor patient.

## 2013-12-07 NOTE — Progress Notes (Signed)
ANTICOAGULATION CONSULT NOTE - Follow Up Consult  Pharmacy Consult for heparin Indication: r/o PE  No Known Allergies  Patient Measurements: Height: 5' 4.96" (165 cm) Weight: 142 lb 6.4 oz (64.592 kg) IBW/kg (Calculated) : 61.41 Heparin Dosing Weight: 61 kg  Vital Signs: Temp: 98.6 F (37 C) (09/24 0548) Temp src: Oral (09/24 0548) BP: 120/53 mmHg (09/24 0548) Pulse Rate: 70 (09/24 0548)  Labs:  Recent Labs  12/05/13 1846 12/05/13 2303 12/06/13 0437 12/07/13 0500 12/07/13 0628  HGB  --   --  11.3* 11.7*  --   HCT  --   --  36.4* 36.5*  --   PLT  --   --  127* 85*  --   HEPARINUNFRC  --   --   --   --  0.76*  CREATININE  --  4.00* 4.75*  --  3.51*  TROPONINI <0.30 <0.30 <0.30  --   --     Estimated Creatinine Clearance: 16.8 ml/min (by C-G formula based on Cr of 3.51).   Medications:  Scheduled:  . acyclovir  400 mg Oral Daily  . amiodarone  200 mg Oral Daily  . antiseptic oral rinse  7 mL Mouth Rinse BID  . ceFEPime (MAXIPIME) IV  2 g Intravenous Q M,W,F-HD  . dexamethasone  40 mg Oral Q Tue  . docusate sodium  100 mg Oral BID  . ipratropium-albuterol  3 mL Nebulization TID  . megestrol  800 mg Oral Daily  . midodrine  10 mg Oral TID WC  . multivitamin  1 tablet Oral QHS  . sodium chloride  3 mL Intravenous Q12H  . tamsulosin  0.4 mg Oral Daily  . vancomycin  750 mg Intravenous Q M,W,F-HD   Infusions:  . heparin 900 Units/hr (12/07/13 0859)    Assessment: 73 yo male with r/o PE is currently on thearpeutic heparin.  Heparin level is 0.35. Goal of Therapy:  Heparin level 0.3-0.7 units/ml Monitor platelets by anticoagulation protocol: Yes   Plan:  - continue heparin at 900 units/hr - heparin level in am  Mahayla Haddaway, Tsz-Yin 12/07/2013,3:24 PM

## 2013-12-07 NOTE — Progress Notes (Signed)
Occupational Therapy Evaluation Patient Details Name: Scott [name] MRN: [id number] DOB: 14-May-1940 Today's Date: 12/07/2013    History of Present Illness 73 y.o. male admitted to [facility] on 12/05/13 with worsening SOB.  Pt dx with PNA from CXR from other facility (transfer from [place]).  Having a VQ scan to r/o PE.  Pt with significant PMHx of pacemaker, SOB, protein calorie malnutrition, HTN, COPD, DM, ESRD on HD MWFri, and multiple myeloma.    Clinical Impression   PTA, pt required assistance for ADL and mobility. Pt states he could usually transfer himself to his w/c and do some of his ADL. Evaluation limited due to transport coming for procedure. At this time, recommend D/C home with 24/7 S and [place]. Pt will benefit from skilled OT services to facilitate D/C to next venue due to below deficits.    Follow Up Recommendations  Home health OT;Supervision/Assistance - 24 hour    Equipment Recommendations  None recommended by OT    Recommendations for Other Services       Precautions / Restrictions Precautions Precautions: Fall Precaution Comments: pt with h/o falls three to the ground in the past month (wife reports it is when he tries to do things without help).       Mobility Bed Mobility Overal bed mobility: Needs Assistance Bed Mobility: Supine to Sit     Supine to sit: Mod assist     General bed mobility comments: Assistance at trunk provided by wife.   Transfers Overall transfer level: Needs assistance Equipment used: Rolling walker (2 wheeled) Transfers: Sit to/from [facility] Sit to Stand: Min assist;Mod assist Stand pivot transfers: Mod assist       General transfer comment: unable to assess due to procedure    Balance Overall balance assessment: Needs assistance Sitting-balance support: Feet supported;Bilateral upper extremity supported Sitting balance-Leahy Scale: Fair Sitting balance - Comments: pt relies on his hands supported on EOB.   Has poor endurance for unsupported sitting.    Standing balance support: Bilateral upper extremity supported Standing balance-Leahy Scale: Poor Standing balance comment: unable to stand without external assist and support of RW.                             ADL Overall ADL's : Needs assistance/impaired     Grooming: Set up   Upper Body Bathing: Set up;Bed level   Lower Body Bathing: Moderate assistance;Bed level   Upper Body Dressing : Set up;Supervision/safety;Bed level   Lower Body Dressing: Moderate assistance;Bed level               Functional mobility during ADLs:  (not assessed ) General ADL Comments: Pt states he feels weaker than normal     Vision                     Perception     Praxis      Pertinent Vitals/Pain Pain Assessment: No/denies pain O2 96 on 2L     Hand Dominance Right   Extremity/Trunk Assessment Upper Extremity Assessment Upper Extremity Assessment: Generalized weakness   Lower Extremity Assessment Lower Extremity Assessment: Defer to PT evaluation   Cervical / Trunk Assessment Cervical / Trunk Assessment: Kyphotic   Communication Communication Communication: No difficulties   Cognition Arousal/Alertness: Awake/alert Behavior During Therapy: WFL for tasks assessed/performed Overall Cognitive Status: History of cognitive impairments - at baseline       Memory: Decreased short-term memory (relies  on wife for hx questions)             General Comments       Exercises       Shoulder Instructions      Home Living Family/patient expects to be discharged to:: Private residence Living Arrangements: Spouse/significant other Available Help at Discharge: Family;Available 24 hours/day Type of Home: Mobile home Home Access: Ramped entrance     Home Layout: One level     Bathroom Shower/Tub: Tub/shower unit         Home Equipment: Environmental consultant - 2 wheels;Wheelchair - manual;Bedside commode    Additional Comments: pt has [place] and HHPT      Prior Functioning/Environment Level of Independence: Needs assistance  Gait / Transfers Assistance Needed: Per wife he can usually transfer bed to [facility] stand pivot on his own with upper extremities supported on WC armrests.   ADL's / Homemaking Assistance Needed: wife assists as needed with bathing/dressing        OT Diagnosis: Generalized weakness   OT Problem List: Decreased strength;Decreased activity tolerance;Decreased safety awareness;Cardiopulmonary status limiting activity;Impaired sensation   OT Treatment/Interventions: Self-care/ADL training;Therapeutic exercise;Energy conservation;DME and/or AE instruction;Therapeutic activities;Patient/family education;Balance training    OT Goals(Current goals can be found in the care plan section) Acute Rehab OT Goals Patient Stated Goal: to go home OT Goal Formulation: With patient Time For Goal Achievement: 12/21/13 Potential to Achieve Goals: Good  OT Frequency: Min 2X/week   Barriers to D/C:            Co-evaluation              End of Session Equipment Utilized During Treatment: Oxygen  Activity Tolerance: Patient tolerated treatment well Patient left: in bed;with call bell/phone within reach;Other (comment) (Pt being taken to nuclear med)   Time: 1350-1401 OT Time Calculation (min): 11 min Charges:  OT General Charges $OT Visit: 1 Procedure OT Evaluation $Initial OT Evaluation Tier I: 1 Procedure G-Codes:    Hoby Kawai,HILLARY 12-23-13, 2:34 PM   [facility], OTR/L  [phone] Dec 23, 2013

## 2013-12-07 NOTE — Progress Notes (Signed)
VASCULAR LAB PRELIMINARY  PRELIMINARY  PRELIMINARY  PRELIMINARY  Bilateral lower extremity venous duplex completed.    Preliminary report:  Right:  No evidence of DVT, superficial thrombosis, or Baker's cyst. Left: what appears to be a chronic DVT noted in the proximal gastrocnemius vein.  No evidence of superficial thrombosis.  No Baker's cyst.  Kincaid Tiger, RVS 12/07/2013, 4:27 PM

## 2013-12-07 NOTE — Progress Notes (Signed)
ANTICOAGULATION CONSULT NOTE - Follow Up Consult  Pharmacy Consult for heparin Indication: Rule out PE  No Known Allergies  Patient Measurements: Height: 5' 4.96" (165 cm) Weight: 142 lb 6.4 oz (64.592 kg) IBW/kg (Calculated) : 61.41 Heparin Dosing Weight: 61kg  Vital Signs: Temp: 98.6 F (37 C) (09/24 0548) Temp src: Oral (09/24 0548) BP: 120/53 mmHg (09/24 0548) Pulse Rate: 70 (09/24 0548)  Labs:  Recent Labs  12/05/13 1846 12/05/13 2303 12/06/13 0437 12/07/13 0628  HGB  --   --  11.3*  --   HCT  --   --  36.4*  --   PLT  --   --  127*  --   HEPARINUNFRC  --   --   --  0.76*  CREATININE  --  4.00* 4.75*  --   TROPONINI <0.30 <0.30 <0.30  --     Estimated Creatinine Clearance: 12.4 ml/min (by C-G formula based on Cr of 4.75).   Medical History: Past Medical History  Diagnosis Date  . Pacemaker   . Shortness of breath     "when I take one of my chemo pills" (03/17/2013)  . Protein calorie malnutrition   . History of blood transfusion X 1    "count was too low"   . Cataract   . Hypertension   . COPD (chronic obstructive pulmonary disease)   . Pneumonia 06/09/2013; 08/2013; 10/2013; 11/2013  . Type II diabetes mellitus     no meds  . Arthritis     "fingers" (12/05/2013)  . ESRD (end stage renal disease) on dialysis     "MWF; [place]" (12/05/2013)  . Multiple myeloma   . Skin cancer of face     "once; left side of my face" (12/05/2013)    Medications:  See EMR  Assessment: 73 year male patient transferred from [facility] emergency room with pneumonia and shortness of breath. On IV heparin while DVT/PE is being ruled out. First heparin level is supra-therapeutic at 0.76. RN reports no s/s of bleeding. AM CBC pending.   Goal of Therapy:  Heparin level 0.3-0.7 units/ml Monitor platelets by anticoagulation protocol: Yes   Plan:  Decrease heparin infusion 900 units/hr Repeat anti-Xa level in 8 hours and daily while on heparin Continue to monitor H&H  and platelets  Albertina Parr, PharmD.  Clinical Pharmacist Pager [phone]

## 2013-12-07 NOTE — Evaluation (Signed)
Physical Therapy Evaluation Patient Details Name: Scott [name] MRN: [id number] DOB: [birth year] Today's Date: 12/07/2013   History of Present Illness  73 y.o. male admitted to [facility] on 12/05/13 with worsening SOB.  Pt dx with PNA from CXR from other facility (transfer from [place]).  He is also having a VQ scan to r/o PE.  Pt with significant PMHx of pacemaker (which I do not believe he has anymore), SOB, protein calorie malnutrition, HTN, COPD, DM, ESRD on HD MWFri, and multiple myeloma.   Clinical Impression  Pt is weaker than his normal baseline.  His O2 sats are dropping on RA and DOE increased to 3-4/4 with just bed to chair transfer.  Pt's wife is assisting in his mobility and is his primary caregiver at home.  Pt would benefit from acute and f/u PT at discharge and may need home O2.  Pt was active with HHPT and HHRN prior to admission and will need this resumed at discharge.  Pt is not at all open to rehab options that don't involve going home.     Follow Up Recommendations Home health PT;Supervision/Assistance - 24 hour;Other (comment) (possible need for home O2)    Equipment Recommendations  None recommended by PT    Recommendations for Other Services   NA     Precautions / Restrictions Precautions Precautions: Fall Precaution Comments: pt with h/o falls three to the ground in the past month (wife reports it is when he tries to do things without help).  Restrictions Weight Bearing Restrictions: No      Mobility  Bed Mobility Overal bed mobility: Needs Assistance Bed Mobility: Supine to Sit     Supine to sit: Mod assist     General bed mobility comments: Assistance at trunk provided by wife.   Transfers Overall transfer level: Needs assistance Equipment used: Rolling walker (2 wheeled) Transfers: Sit to/from [facility] Sit to Stand: Min assist;Mod assist Stand pivot transfers: Mod assist       General transfer comment: Min assist on second stand  from bed to RW, stand pivot mod assist due to weak legs and buckling with transfer.  Pt sat and said, "man, I have gotten weak"  Ambulation/Gait             General Gait Details: attempted, but unable.               Balance Overall balance assessment: Needs assistance Sitting-balance support: Feet supported;Bilateral upper extremity supported Sitting balance-Leahy Scale: Fair Sitting balance - Comments: pt relies on his hands supported on EOB.  Has poor endurance for unsupported sitting.    Standing balance support: Bilateral upper extremity supported Standing balance-Leahy Scale: Poor Standing balance comment: unable to stand without external assist and support of RW.                              Pertinent Vitals/Pain Pain Assessment: No/denies pain    Home Living Family/patient expects to be discharged to:: Private residence Living Arrangements: Spouse/significant other Available Help at Discharge: Family;Available 24 hours/day Type of Home: Mobile home Home Access: Ramped entrance     Home Layout: One level Home Equipment: [place] - 2 wheels;Wheelchair - manual;Bedside commode Additional Comments: pt has [place] and HHPT    Prior Function Level of Independence: Needs assistance   Gait / Transfers Assistance Needed: Per wife he can usually transfer bed to [facility] stand pivot on his own with upper  extremities supported on WC armrests.                Extremity/Trunk Assessment   Upper Extremity Assessment: Defer to OT evaluation           Lower Extremity Assessment: Generalized weakness      Cervical / Trunk Assessment: Kyphotic  Communication   Communication: No difficulties  Cognition Arousal/Alertness: Awake/alert Behavior During Therapy: WFL for tasks assessed/performed Overall Cognitive Status: History of cognitive impairments - at baseline       Memory: Decreased short-term memory (relies on wife for hx questions)               General Comments General comments (skin integrity, edema, etc.): Pt reports he does not normally use O2 at home.  O2 on 2 L O2 [place] at rest 96%, with no DOE.  O2 with mobility on RA 85% with DOE 3-4/4 HR 100.  Put 2 L O@ [place] back on and pt returned to 93% and 96 bpm.            Assessment/Plan    PT Assessment Patient needs continued PT services  PT Diagnosis Abnormality of gait;Difficulty walking;Generalized weakness   PT Problem List Decreased strength;Decreased activity tolerance;Decreased balance;Decreased mobility;Cardiopulmonary status limiting activity  PT Treatment Interventions DME instruction;Gait training;Functional mobility training;Therapeutic activities;Therapeutic exercise;Balance training;Neuromuscular re-education;Patient/family education   PT Goals (Current goals can be found in the Care Plan section) Acute Rehab PT Goals Patient Stated Goal: to go home PT Goal Formulation: With patient/family Time For Goal Achievement: 12/21/13 Potential to Achieve Goals: Good    Frequency Min 3X/week           End of Session Equipment Utilized During Treatment: Gait belt;Oxygen Activity Tolerance: Patient limited by fatigue;Other (comment) (limited by DOE) Patient left: in chair;with call bell/phone within reach;with family/visitor present Nurse Communication: Mobility status;Other (comment) (drop in O2 sats)         Time: 8270-7867 PT Time Calculation (min): 30 min   Charges:   PT Evaluation $Initial PT Evaluation Tier I: 1 Procedure PT Treatments $Therapeutic Activity: 8-22 mins        Brayln Duque B. Radnor, [place], DPT [phone]   12/07/2013, 11:29 AM

## 2013-12-08 DIAGNOSIS — Z992 Dependence on renal dialysis: Secondary | ICD-10-CM

## 2013-12-08 LAB — CBC
HCT: 33.2 % — ABNORMAL LOW (ref 39.0–52.0)
Hemoglobin: 10.8 g/dL — ABNORMAL LOW (ref 13.0–17.0)
MCH: 28.1 pg (ref 26.0–34.0)
MCHC: 32.5 g/dL (ref 30.0–36.0)
MCV: 86.2 fL (ref 78.0–100.0)
PLATELETS: 91 10*3/uL — AB (ref 150–400)
RBC: 3.85 MIL/uL — ABNORMAL LOW (ref 4.22–5.81)
RDW: 18.9 % — AB (ref 11.5–15.5)
WBC: 17.4 10*3/uL — AB (ref 4.0–10.5)

## 2013-12-08 LAB — HEPARIN LEVEL (UNFRACTIONATED): Heparin Unfractionated: 0.27 IU/mL — ABNORMAL LOW (ref 0.30–0.70)

## 2013-12-08 LAB — PHOSPHORUS: Phosphorus: 4.8 mg/dL — ABNORMAL HIGH (ref 2.3–4.6)

## 2013-12-08 LAB — BASIC METABOLIC PANEL
ANION GAP: 14 (ref 5–15)
BUN: 37 mg/dL — ABNORMAL HIGH (ref 6–23)
CHLORIDE: 100 meq/L (ref 96–112)
CO2: 24 meq/L (ref 19–32)
CREATININE: 4.45 mg/dL — AB (ref 0.50–1.35)
Calcium: 7.7 mg/dL — ABNORMAL LOW (ref 8.4–10.5)
GFR calc non Af Amer: 12 mL/min — ABNORMAL LOW (ref >90)
GFR, EST AFRICAN AMERICAN: 14 mL/min — AB (ref >90)
Glucose, Bld: 84 mg/dL (ref 70–99)
POTASSIUM: 3.4 meq/L — AB (ref 3.7–5.3)
Sodium: 138 mEq/L (ref 137–147)

## 2013-12-08 LAB — URINE CULTURE
COLONY COUNT: NO GROWTH
Culture: NO GROWTH

## 2013-12-08 LAB — GLUCOSE, CAPILLARY
GLUCOSE-CAPILLARY: 86 mg/dL (ref 70–99)
GLUCOSE-CAPILLARY: 137 mg/dL — AB (ref 70–99)
GLUCOSE-CAPILLARY: 139 mg/dL — AB (ref 70–99)
Glucose-Capillary: 132 mg/dL — ABNORMAL HIGH (ref 70–99)
Glucose-Capillary: 131 mg/dL — ABNORMAL HIGH (ref 70–99)

## 2013-12-08 MED ORDER — LIDOCAINE-PRILOCAINE 2.5-2.5 % EX CREA: 1.0000 "application " | TOPICAL_CREAM | CUTANEOUS | Status: DC | PRN

## 2013-12-08 MED ORDER — VANCOMYCIN HCL 500 MG IV SOLR: 500.0000 mg | INTRAVENOUS | Status: DC

## 2013-12-08 MED ORDER — LEVOFLOXACIN 500 MG PO TABS
500.0000 mg | ORAL_TABLET | ORAL | Status: DC
  Administered 2013-12-10: 500 mg via ORAL
  Filled 2013-12-08: qty 1

## 2013-12-08 MED ORDER — ALTEPLASE 2 MG IJ SOLR
2.0000 mg | Freq: Once | INTRAMUSCULAR | Status: AC | PRN
Start: 2013-12-08 — End: 2013-12-08

## 2013-12-08 MED ORDER — HEPARIN SODIUM (PORCINE) 1000 UNIT/ML DIALYSIS: 2000.0000 [IU] | Freq: Once | INTRAMUSCULAR | Status: DC

## 2013-12-08 MED ORDER — LIDOCAINE HCL (PF) 1 % IJ SOLN: 5.0000 mL | INTRAMUSCULAR | Status: DC | PRN

## 2013-12-08 MED ORDER — HEPARIN SODIUM (PORCINE) 1000 UNIT/ML DIALYSIS: 1000.0000 [IU] | INTRAMUSCULAR | Status: DC | PRN

## 2013-12-08 MED ORDER — VANCOMYCIN HCL 500 MG IV SOLR
500.0000 mg | INTRAVENOUS | Status: AC
  Administered 2013-12-08: 500 mg via INTRAVENOUS
  Filled 2013-12-08: qty 500

## 2013-12-08 MED ORDER — LEVOFLOXACIN 750 MG PO TABS
750.0000 mg | ORAL_TABLET | Freq: Once | ORAL | Status: AC
  Administered 2013-12-08: 750 mg via ORAL
  Filled 2013-12-08: qty 1

## 2013-12-08 MED ORDER — SODIUM CHLORIDE 0.9 % IV SOLN: 100.0000 mL | INTRAVENOUS | Status: DC | PRN

## 2013-12-08 MED ORDER — ASPIRIN 81 MG PO CHEW
81.0000 mg | CHEWABLE_TABLET | Freq: Every day | ORAL | Status: DC
  Administered 2013-12-08 – 2013-12-10 (×3): 81 mg via ORAL
  Filled 2013-12-08 (×2): qty 1

## 2013-12-08 MED ORDER — PENTAFLUOROPROP-TETRAFLUOROETH EX AERO: 1.0000 "application " | INHALATION_SPRAY | CUTANEOUS | Status: DC | PRN

## 2013-12-08 NOTE — Progress Notes (Signed)
ANTIBIOTIC CONSULT NOTE - FOLLOW UP  Pharmacy Consult for Vancomycin and Cefepime Indication: HCAP  No Known Allergies  Patient Measurements: Height: 5' 4.96" (165 cm) Weight: 141 lb 8.6 oz (64.2 kg) IBW/kg (Calculated) : 61.41 Adjusted Body Weight:   Vital Signs: Temp: 97.3 F (36.3 C) (09/25 0830) Temp src: Oral (09/25 0830) BP: 98/52 mmHg (09/25 1058) Pulse Rate: 73 (09/25 1058) Intake/Output from previous day: 09/24 0701 - 09/25 0700 In: 609 [P.O.:240; I.V.:369] Out: 150 [Urine:150] Intake/Output from this shift:    Labs:  Recent Labs  12/06/13 0437 12/07/13 0500 12/07/13 0628 12/08/13 0500  WBC 13.5* 20.0*  --  17.4*  HGB 11.3* 11.7*  --  10.8*  PLT 127* 85*  --  91*  CREATININE 4.75*  --  3.51* 4.45*   Estimated Creatinine Clearance: 13.2 ml/min (by C-G formula based on Cr of 4.45). No results found for this basename: VANCOTROUGH, VANCOPEAK, VANCORANDOM, GENTTROUGH, GENTPEAK, GENTRANDOM, [place], TOBRAPEAK, TOBRARND, AMIKACINPEAK, AMIKACINTROU, AMIKACIN,  in the last 72 hours   Microbiology: Recent Results (from the past 720 hour(s))  CULTURE, BLOOD (ROUTINE X 2)     Status: None   Collection Time    11/25/13  3:00 AM      Result Value Ref Range Status   Specimen Description BLOOD LEFT FOREARM   Final   Special Requests BOTTLES DRAWN AEROBIC AND ANAEROBIC [facility] EACH   Final   Culture  Setup Time     Final   Value: 11/25/2013 15:10     Performed at [facility]   Culture     Final   Value: EUBACTERIUM LIMOSUM     Note: Gram Stain Report Called to,Read Back By and Verified With: [place] USSERY 12/01/13 AT 0030 RIDK     Performed at [facility]   Report Status 12/03/2013 FINAL   Final  CULTURE, BLOOD (ROUTINE X 2)     Status: None   Collection Time    11/25/13  3:28 AM      Result Value Ref Range Status   Specimen Description BLOOD LEFT HAND   Final   Special Requests BOTTLES DRAWN AEROBIC AND ANAEROBIC 10CC EACH   Final   Culture   Setup Time     Final   Value: 11/25/2013 15:20     Performed at [facility]   Culture     Final   Value: NO GROWTH 5 DAYS     Note: Culture results may be compromised due to an excessive volume of blood received in culture bottles.     Performed at [facility]   Report Status 12/01/2013 FINAL   Final  CULTURE, BLOOD (ROUTINE X 2)     Status: None   Collection Time    11/25/13  8:45 AM      Result Value Ref Range Status   Specimen Description BLOOD LEFT HAND   Final   Special Requests BOTTLES DRAWN AEROBIC ONLY 3CC    Final   Culture  Setup Time     Final   Value: 11/25/2013 15:21     Performed at [facility]   Culture     Final   Value: NO GROWTH 5 DAYS     Performed at [facility]   Report Status 12/01/2013 FINAL   Final  CULTURE, BLOOD (ROUTINE X 2)     Status: None   Collection Time    11/25/13  8:53 AM      Result Value Ref  Range Status   Specimen Description BLOOD LEFT HAND   Final   Special Requests BOTTLES DRAWN AEROBIC ONLY 5CC   Final   Culture  Setup Time     Final   Value: 11/25/2013 15:19     Performed at [facility]   Culture     Final   Value: NO GROWTH 5 DAYS     Performed at [facility]   Report Status 12/01/2013 FINAL   Final  URINE CULTURE     Status: None   Collection Time    11/25/13 10:47 AM      Result Value Ref Range Status   Specimen Description URINE, CLEAN CATCH   Final   Special Requests NONE   Final   Culture  Setup Time     Final   Value: 11/25/2013 19:34     Performed at [place]     Final   Value: 5,000 COLONIES/ML     Performed at [facility]   Culture     Final   Value: INSIGNIFICANT GROWTH     Performed at [facility]   Report Status 11/26/2013 FINAL   Final  MRSA PCR SCREENING     Status: None   Collection Time    12/05/13  4:35 PM      Result Value Ref Range Status   MRSA by PCR NEGATIVE  NEGATIVE Final   Comment:            The  GeneXpert MRSA Assay (FDA     approved for NASAL specimens     only), is one component of a     comprehensive MRSA colonization     surveillance program. It is not     intended to diagnose MRSA     infection nor to guide or     monitor treatment for     MRSA infections.  CULTURE, BLOOD (ROUTINE X 2)     Status: None   Collection Time    12/05/13  6:40 PM      Result Value Ref Range Status   Specimen Description BLOOD LEFT ARM   Final   Special Requests BOTTLES DRAWN AEROBIC AND ANAEROBIC 10CC   Final   Culture  Setup Time     Final   Value: 12/06/2013 00:50     Performed at [facility]   Culture     Final   Value:        BLOOD CULTURE RECEIVED NO GROWTH TO DATE CULTURE WILL BE HELD FOR 5 DAYS BEFORE ISSUING A FINAL NEGATIVE REPORT     Performed at [facility]   Report Status PENDING   Incomplete  CULTURE, BLOOD (ROUTINE X 2)     Status: None   Collection Time    12/05/13  6:54 PM      Result Value Ref Range Status   Specimen Description BLOOD LEFT ARM   Final   Special Requests     Final   Value: BOTTLES DRAWN AEROBIC AND ANAEROBIC BLUE 10CC RED 5CC   Culture  Setup Time     Final   Value: 12/06/2013 00:50     Performed at [facility]   Culture     Final   Value:        BLOOD CULTURE RECEIVED NO GROWTH TO DATE CULTURE WILL BE HELD FOR 5 DAYS BEFORE ISSUING A FINAL NEGATIVE REPORT     Performed  at [facility]   Report Status PENDING   Incomplete    Anti-infectives   Start     Dose/Rate Route Frequency Ordered Stop   12/11/13 1200  vancomycin (VANCOCIN) 500 mg in sodium chloride 0.9 % 100 mL IVPB     500 mg 100 mL/hr over 60 Minutes Intravenous Every M-W-F (Hemodialysis) 12/08/13 1030     12/08/13 1200  vancomycin (VANCOCIN) 500 mg in sodium chloride 0.9 % 100 mL IVPB     500 mg 100 mL/hr over 60 Minutes Intravenous To Hemodialysis 12/08/13 1030 12/09/13 1200   12/06/13 1200  vancomycin (VANCOCIN) IVPB 750 mg/150 ml premix  Status:   Discontinued     750 mg 150 mL/hr over 60 Minutes Intravenous Every M-W-F (Hemodialysis) 12/05/13 1805 12/08/13 1030   12/05/13 2000  vancomycin (VANCOCIN) 1,500 mg in sodium chloride 0.9 % 500 mL IVPB     1,500 mg 250 mL/hr over 120 Minutes Intravenous  Once 12/05/13 1805 12/05/13 2333   12/05/13 1930  ceFEPIme (MAXIPIME) 2 g in dextrose 5 % 50 mL IVPB     2 g 100 mL/hr over 30 Minutes Intravenous Every M-W-F (Hemodialysis) 12/05/13 1803     12/05/13 1830  acyclovir (ZOVIRAX) 200 MG capsule 400 mg     400 mg Oral Daily 12/05/13 1733     12/05/13 1800  acyclovir (ZOVIRAX) tablet 400 mg  Status:  Discontinued     400 mg Oral Daily 12/05/13 1721 12/05/13 1733      Assessment: 73yom on Vancomycin and Cefepime for suspected HCAP. Patient is currently afebrile, WBC trending down and cultures have reported no growth. Patient is ESRD and receives HD on MWF. - Wt: 64kg  Goal of Therapy:  Pre-HD Vancomycin level: 15-57mcg/ml  Plan:  1. Change Vancomycin to 500mg  IV qHD (MWF) - check level early next week if continued 2. Continue Cefepime 2g IV qHD (MWF) 3. Follow-up antibiotic narrowing/LOT  Earleen Newport [phone] 12/08/2013,11:02 AM

## 2013-12-08 NOTE — Progress Notes (Signed)
[facility] PROGRESS NOTE  KAMALI NEPHEW AST:[id number] DOB: November 08, 1940 DOA: 12/05/2013 PCP: No primary provider on file.  Brief narrative:  73 year male patient transferred from [facility] emergency room with pneumonia. He presented with shortness of breath, cough, weakness and was found to be hypoxic. Chest x-ray noted left bibasilar atelectasis or platelike infiltrate but no pulmonary edema. Patient recently hospitalized for pneumonia from 9/12 to 9/13 but left AMA but apparently did receive antibiotics which he reportedly had continued to take prior to this admission. He had not missed any hemodialysis treatments. He was not normally on oxygen at home.  After arrival to [facility] he appeared comfortable on 2 L of oxygen. He was noted to have bilateral lower extremity edema greater on the left side concerning or possible DVT with associated PE in the differential. He was hemodynamically stable. He did have a leukocytosis of 13,000.  Since admission Dr. Thereasa Solo had discussion with patient and wife at bedside. Patient's baseline mobility is out of bed to wheelchair with walker in front of him and he uses the walker to propel himself while seated in the wheelchair. Patient seems very unhappy with his current clinical status but defers to his wife in regards to making all medical decisions. States is scheduled to see his Oncologist on October 1. Again asking when he can return to home. Explained to patient and wife rationale for Dopplers and VQ scan. Also discussed with them the possibility that patient may benefit from home O2 if he qualifies.        Assessment/Plan: #1 Acute Respiratory Failure with hypoxia/?HCAP/Grade 3 diastolic dysfunction Clinical improvement. V/Q scan with low probability for PE. 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 DC IV antibiotics. Narrow down to oral Levaquin to complete  course of antibiotic. Will place on a baby aspirin and discontinue IV heparin. Outpatient followup with PCP in primary hematologist.   #2 chronic kidney disease stage V on hemodialysis  patient is being followed by nephrology.  #3 diabetes mellitus Patient has been on Decadron once weekly. Hemoglobin A1c 5. CBGs are ranging from 86-137. Sliding scale insulin.   #4 history of V. Fib On  amiodarone. X-ray reveals AICD. 2-D echo June 2015 with present LV function and grade 3 diastolic dysfunction. Follow.  #5 bilateral lower extremity edema Lower extremity Dopplers with old DVT noted on the left. DC IV heparin. Discussed with hematology will start patient on baby aspirin. Patient is to followup with his primary hematologist as outpatient.   #6 history of multiple myeloma Continue weekly Decadron and Zovirax.  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 Continue recommendations a wound care nurse.  #8 chronic right DVT Per LE doppler. Patient with history of multiple myeloma, currently on IV heparin. D/C IV heparin. Discussed with hematology and will place on ASA 81 mg daily. Outpatient followup with primary hematologist.       Code Status: Full Family Communication: Updated patient and family at bedside. Disposition Plan: Home when medically stable, tomorrow.   Consultants:  Nephrology:Dr Shertz 12/06/2013  Procedures:  Lower extremity Dopplers 12/07/2013  VQ scan 12/07/2013  X-ray 12/05/2013  Antibiotics: Acyclovir 9/23 >>> 12/08/13 Cefepime 9/22 >>> 12/08/13 Vancomycin 9/22 >>>12/08/13   HPI/Subjective: Patient states shortness of breath has improved. No complaints. Patient asking when he can go home.  Objective: Filed Vitals:   12/08/13 1513  BP: 93/51  Pulse:  81  Temp:   Resp: 14    Intake/Output Summary (Last 24 hours) at 12/08/13 1700 Last data filed at 12/08/13 1235  Gross per 24 hour  Intake      0 ml  Output   2159 ml  Net  -2159  ml   Filed Weights   12/08/13 0358 12/08/13 0830 12/08/13 1235  Weight: 62.234 kg (137 lb 3.2 oz) 64.2 kg (141 lb 8.6 oz) 62.1 kg (136 lb 14.5 oz)    Exam:   General:  NAD  Cardiovascular: RRR  Respiratory: CTAB  Abdomen: Soft, nontender, nondistended, positive bowel sounds  Musculoskeletal: No clubbing cyanosis or edema  Data Reviewed: Basic Metabolic Panel:  Recent Labs Lab 12/05/13 2303 12/06/13 0437 12/07/13 0628 12/08/13 0500  NA 140 143 139 138  K 4.9 5.2 4.1 3.4*  CL 103 103 99 100  CO2 19 23 24 24   GLUCOSE 250* 150* 112* 84  BUN 33* 39* 26* 37*  CREATININE 4.00* 4.75* 3.51* 4.45*  CALCIUM 6.8* 7.9* 8.1* 7.7*  PHOS  --   --   --  4.8*   Liver Function Tests:  Recent Labs Lab 12/06/13 0437 12/07/13 0628  AST 17 18  ALT 12 12  ALKPHOS 76 76  BILITOT 0.5 0.7  PROT 5.0* 5.0*  ALBUMIN 1.9* 2.0*   No results found for this basename: LIPASE, AMYLASE,  in the last 168 hours No results found for this basename: AMMONIA,  in the last 168 hours CBC:  Recent Labs Lab 12/06/13 0437 12/07/13 0500 12/08/13 0500  WBC 13.5* 20.0* 17.4*  HGB 11.3* 11.7* 10.8*  HCT 36.4* 36.5* 33.2*  MCV 89.2 88.8 86.2  PLT 127* 85* 91*   Cardiac Enzymes:  Recent Labs Lab 12/05/13 1846 12/05/13 2303 12/06/13 0437  TROPONINI <0.30 <0.30 <0.30   BNP (last 3 results)  Recent Labs  06/08/13 2145 11/04/13 2328  PROBNP 16467.0* 13759.0*   CBG:  Recent Labs Lab 12/06/13 0731 12/06/13 1122 12/07/13 2229 12/08/13 0726 12/08/13 1625  GLUCAP 163* 245* 118* 86 137*    Recent Results (from the past 240 hour(s))  MRSA PCR SCREENING     Status: None   Collection Time    12/05/13  4:35 PM      Result Value Ref Range Status   MRSA by PCR NEGATIVE  NEGATIVE Final   Comment:            The GeneXpert MRSA Assay (FDA     approved for NASAL specimens     only), is one component of a     comprehensive MRSA colonization     surveillance program. It is not      intended to diagnose MRSA     infection nor to guide or     monitor treatment for     MRSA infections.  CULTURE, BLOOD (ROUTINE X 2)     Status: None   Collection Time    12/05/13  6:40 PM      Result Value Ref Range Status   Specimen Description BLOOD LEFT ARM   Final   Special Requests BOTTLES DRAWN AEROBIC AND ANAEROBIC 10CC   Final   Culture  Setup Time     Final   Value: 12/06/2013 00:50     Performed at [facility]   Culture     Final   Value:        BLOOD CULTURE RECEIVED NO GROWTH TO DATE CULTURE WILL BE HELD FOR 5 DAYS  BEFORE ISSUING A FINAL NEGATIVE REPORT     Performed at [facility]   Report Status PENDING   Incomplete  CULTURE, BLOOD (ROUTINE X 2)     Status: None   Collection Time    12/05/13  6:54 PM      Result Value Ref Range Status   Specimen Description BLOOD LEFT ARM   Final   Special Requests     Final   Value: BOTTLES DRAWN AEROBIC AND ANAEROBIC BLUE 10CC RED 5CC   Culture  Setup Time     Final   Value: 12/06/2013 00:50     Performed at [facility]   Culture     Final   Value:        BLOOD CULTURE RECEIVED NO GROWTH TO DATE CULTURE WILL BE HELD FOR 5 DAYS BEFORE ISSUING A FINAL NEGATIVE REPORT     Performed at [facility]   Report Status PENDING   Incomplete     Studies: Nm Pulmonary Perf And Vent  12/07/2013   CLINICAL DATA:  Shortness of breath.  EXAM: NUCLEAR MEDICINE VENTILATION - PERFUSION LUNG SCAN  TECHNIQUE: Ventilation images were obtained in multiple projections using inhaled aerosol technetium 99 M DTPA. Perfusion images were obtained in multiple projections after intravenous injection of Tc-91mMAA.  RADIOPHARMACEUTICALS:  40 mCi Tc-9103mTPA aerosol and 6 mCi Tc-9913mA  COMPARISON:  Chest radiograph 12/05/2013  FINDINGS: Ventilation: The ventilation images are very limited due to clumping of the radiopharmaceutical throughout both lungs.  Perfusion: No wedge shaped peripheral perfusion defects to suggest acute  pulmonary embolism. There is a photopenic defect in the left chest related to the cardiac pacemaker. There may be artifact from an arm on the right lateral perfusion images. Costophrenic angles are not sharp on some of the perfusion images but no large perfusion abnormality in these areas.  IMPRESSION: Very low probability for pulmonary embolism.   Electronically Signed   By: AdaMarkus DaftD.   On: 12/07/2013 15:50    Scheduled Meds: . acyclovir  400 mg Oral Daily  . amiodarone  200 mg Oral Daily  . antiseptic oral rinse  7 mL Mouth Rinse BID  . aspirin  81 mg Oral Daily  . dexamethasone  40 mg Oral Q Tue  . docusate sodium  100 mg Oral BID  . [START ON 12/09/2013] heparin  2,000 Units Dialysis Once in dialysis  . insulin aspart  0-20 Units Subcutaneous TID WC  . ipratropium-albuterol  3 mL Nebulization TID  . levofloxacin  750 mg Oral Daily  . megestrol  800 mg Oral Daily  . midodrine  10 mg Oral TID WC  . multivitamin  1 tablet Oral QHS  . sodium chloride  3 mL Intravenous Q12H  . tamsulosin  0.4 mg Oral Daily   Continuous Infusions:    Active Problems:   Multiple myeloma   HCAP (healthcare-associated pneumonia)   Acute respiratory failure with hypoxia   CKD (chronic kidney disease) stage V requiring chronic dialysis   Decubitus ulcer of sacral region, stage 4   Bilateral lower extremity edema   Physical deconditioning    Time spent: [address] [place] [facility] Pager [phone] 7PM-7AM, please contact night-coverage at [email or web address], password [email or web address]25/2015, 5:00 PM  LOS: 3 days

## 2013-12-08 NOTE — Progress Notes (Signed)
SATURATION QUALIFICATIONS: (This note is used to comply with regulatory documentation for home oxygen)  Patient Saturations on Room Air at Rest =96%  Patient Saturations on Room Air while Ambulating = 85%  Patient Saturations on 2 Liters of oxygen while Ambulating = 93%  Please briefly explain why patient needs home oxygen: Hypoxic without O2 and History of acute resp failure

## 2013-12-08 NOTE — Progress Notes (Signed)
  [facility] Progress Note   Subjective: no complaints today, on HD  Filed Vitals:   12/08/13 0900 12/08/13 0931 12/08/13 1000 12/08/13 1028  BP: 112/64 106/54 100/54 100/63  Pulse: 76 75 77 72  Temp:      TempSrc:      Resp:      Height:      Weight:      SpO2:       Exam: Elderly WM no distress No jvd Chest is clear on L, rales R base RRR no MRG Abd soft, NTND No LE edema Neuro is alert, nf R IJ perm cath w clean exit site, RUA AVF +bruit  HD: MWF [place] 4h  63kg   3K/2.25 Bath  Heparin 2000   R IJ cath / RUA BVT ("not using AVF per Dr Justin Mend currently sec to FTT) 400/800 Aranesp 150 ug q Wed, Venofer none, Vit D none       Assessment: 1 Acute resp failure / cough / ^WBC - suspected PNA, improving on IV vanc/cefipime 2 ESRD on HD 3 Mult myeloma - on decadron only here 4 Sacral decub, stage IV 5 Anemia cont aranesp 6 HPTH no vit D, ? Binder, check P 7 Afib on amio 8 Chronic hypotension on midodrine   Plan- HD today, UF to 62kg as Ronnie Derby MD  pager [phone]    cell [phone]  12/08/2013, 10:45 AM     Recent Labs Lab 12/06/13 0437 12/07/13 0628 12/08/13 0500  NA 143 139 138  K 5.2 4.1 3.4*  CL 103 99 100  CO2 23 24 24   GLUCOSE 150* 112* 84  BUN 39* 26* 37*  CREATININE 4.75* 3.51* 4.45*  CALCIUM 7.9* 8.1* 7.7*    Recent Labs Lab 12/06/13 0437 12/07/13 0628  AST 17 18  ALT 12 12  ALKPHOS 76 76  BILITOT 0.5 0.7  PROT 5.0* 5.0*  ALBUMIN 1.9* 2.0*    Recent Labs Lab 12/06/13 0437 12/07/13 0500 12/08/13 0500  WBC 13.5* 20.0* 17.4*  HGB 11.3* 11.7* 10.8*  HCT 36.4* 36.5* 33.2*  MCV 89.2 88.8 86.2  PLT 127* 85* 91*   . acyclovir  400 mg Oral Daily  . amiodarone  200 mg Oral Daily  . antiseptic oral rinse  7 mL Mouth Rinse BID  . aspirin  81 mg Oral Daily  . ceFEPime (MAXIPIME) IV  2 g Intravenous Q M,W,F-HD  . dexamethasone  40 mg Oral Q Tue  . docusate sodium  100 mg Oral BID  . [START ON 12/09/2013]  heparin  2,000 Units Dialysis Once in dialysis  . insulin aspart  0-20 Units Subcutaneous TID WC  . ipratropium-albuterol  3 mL Nebulization TID  . megestrol  800 mg Oral Daily  . midodrine  10 mg Oral TID WC  . multivitamin  1 tablet Oral QHS  . sodium chloride  3 mL Intravenous Q12H  . tamsulosin  0.4 mg Oral Daily  . [START ON 12/11/2013] vancomycin  500 mg Intravenous Q M,W,F-HD  . vancomycin  500 mg Intravenous To Hemo     sodium chloride, sodium chloride, sodium chloride, acetaminophen, acetaminophen, albuterol, alteplase, feeding supplement (NEPRO CARB STEADY), heparin, HYDROcodone-acetaminophen, lidocaine (PF), lidocaine-prilocaine, pentafluoroprop-tetrafluoroeth, sodium chloride

## 2013-12-08 NOTE — Progress Notes (Signed)
Hemodialysis- Completed without issue. 2L UF and bp stable. Heparin gtt discontinued per order. Pt taken off o2 prior to transport per primary RN. Report called, pt transferred with vitals stable.

## 2013-12-09 ENCOUNTER — Encounter (HOSPITAL_COMMUNITY): Payer: Self-pay | Admitting: Radiology

## 2013-12-09 ENCOUNTER — Inpatient Hospital Stay (HOSPITAL_COMMUNITY): Payer: Medicare HMO

## 2013-12-09 LAB — BLOOD GAS, ARTERIAL
Acid-Base Excess: 2.5 mmol/L — ABNORMAL HIGH (ref 0.0–2.0)
Bicarbonate: 25.3 mEq/L — ABNORMAL HIGH (ref 20.0–24.0)
DRAWN BY: 10552
O2 Content: 2 L/min
O2 Saturation: 95.9 %
PATIENT TEMPERATURE: 98.6
PCO2 ART: 30.8 mmHg — AB (ref 35.0–45.0)
PH ART: 7.524 — AB (ref 7.350–7.450)
TCO2: 26.2 mmol/L (ref 0–100)
pO2, Arterial: 82 mmHg (ref 80.0–100.0)

## 2013-12-09 LAB — GLUCOSE, CAPILLARY
GLUCOSE-CAPILLARY: 85 mg/dL (ref 70–99)
GLUCOSE-CAPILLARY: 84 mg/dL (ref 70–99)
Glucose-Capillary: 116 mg/dL — ABNORMAL HIGH (ref 70–99)
Glucose-Capillary: 94 mg/dL (ref 70–99)

## 2013-12-09 LAB — CBC
HCT: 35.8 % — ABNORMAL LOW (ref 39.0–52.0)
Hemoglobin: 11 g/dL — ABNORMAL LOW (ref 13.0–17.0)
MCH: 27.7 pg (ref 26.0–34.0)
MCHC: 30.7 g/dL (ref 30.0–36.0)
MCV: 90.2 fL (ref 78.0–100.0)
PLATELETS: 89 10*3/uL — AB (ref 150–400)
RBC: 3.97 MIL/uL — ABNORMAL LOW (ref 4.22–5.81)
RDW: 18.9 % — AB (ref 11.5–15.5)
WBC: 14.2 10*3/uL — AB (ref 4.0–10.5)

## 2013-12-09 LAB — HEPARIN LEVEL (UNFRACTIONATED): Heparin Unfractionated: <0.1 IU/mL — ABNORMAL LOW (ref 0.30–0.70)

## 2013-12-09 LAB — TROPONIN I

## 2013-12-09 MED ORDER — METOPROLOL TARTRATE 1 MG/ML IV SOLN
5.0000 mg | Freq: Once | INTRAVENOUS | Status: AC
  Administered 2013-12-09: 4 mg via INTRAVENOUS
  Filled 2013-12-09: qty 5

## 2013-12-09 NOTE — Progress Notes (Signed)
[facility] PROGRESS NOTE  Scott [name] PPI:[id number] DOB: June 18, 1940 DOA: 12/05/2013 PCP: No primary provider on file.  Brief narrative:  73 year male patient transferred from [facility] emergency room with pneumonia. He presented with shortness of breath, cough, weakness and was found to be hypoxic. Chest x-ray noted left bibasilar atelectasis or platelike infiltrate but no pulmonary edema. Patient recently hospitalized for pneumonia from 9/12 to 9/13 but left AMA but apparently did receive antibiotics which he reportedly had continued to take prior to this admission. He had not missed any hemodialysis treatments. He was not normally on oxygen at home.  After arrival to [facility] he appeared comfortable on 2 L of oxygen. He was noted to have bilateral lower extremity edema greater on the left side concerning or possible DVT with associated PE in the differential. He was hemodynamically stable. He did have a leukocytosis of 13,000.  Since admission Dr. Thereasa Solo had discussion with patient and wife at bedside. Patient's baseline mobility is out of bed to wheelchair with walker in front of him and he uses the walker to propel himself while seated in the wheelchair. Patient seems very unhappy with his current clinical status but defers to his wife in regards to making all medical decisions. States is scheduled to see his Oncologist on October 1. Again asking when he can return to home. Explained to patient and wife rationale for Dopplers and VQ scan. Also discussed with them the possibility that patient may benefit from home O2 if he qualifies.        Assessment/Plan: #1 Acute Respiratory Failure with hypoxia/?HCAP/Grade 3 diastolic dysfunction Patient with complaints of worsening shortness of breath. V/Q scan with low probability for PE. 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 Will get ABG. Chest  x-ray has been ordered. Check a CT of the chest. Continue oral Levaquin, nebulizers, aspirin . Will place on incentive spirometry and flutter valve.  Outpatient followup with PCP and primary hematologist.   #2 chronic kidney disease stage V on hemodialysis  patient is being followed by nephrology.  #3 diabetes mellitus Patient has been on Decadron once weekly. Hemoglobin A1c 5. CBGs are ranging from 86-137. Sliding scale insulin.   #4 history of V. Fib On  amiodarone. X-ray reveals AICD. 2-D echo June 2015 with present LV function and grade 3 diastolic dysfunction. Follow.  #5 bilateral lower extremity edema Lower extremity Dopplers with old DVT noted on the left. DC IV heparin. Discussed with hematology will start patient on baby aspirin. Patient is to followup with his primary hematologist as outpatient.   #6 history of multiple myeloma Continue weekly Decadron and Zovirax.  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 Continue recommendations a wound care nurse.  #8 chronic right DVT Per LE doppler. Patient with history of multiple myeloma, currently on IV heparin. D/C IV heparin. Discussed with hematology and will place on ASA 81 mg daily. Outpatient followup with primary hematologist.       Code Status: Full Family Communication: Updated patient and family at bedside. Disposition Plan: Home when medically stable, tomorrow.   Consultants:  Nephrology:Dr Shertz 12/06/2013  Procedures:  Lower extremity Dopplers 12/07/2013  VQ scan 12/07/2013  X-ray 12/05/2013  Antibiotics: Acyclovir 9/23 >>> 12/08/13 Cefepime 9/22 >>> 12/08/13 Vancomycin 9/22 >>>12/08/13   HPI/Subjective: Patient c/o of more shortness of breath with minimal activity. No complaints.  Objective: Filed Vitals:  12/09/13 0500  BP: 118/57  Pulse: 65  Temp: 97.8 F (36.6 C)  Resp: 18    Intake/Output Summary (Last 24 hours) at 12/09/13 1152 Last data filed at 12/09/13 0933   Gross per 24 hour  Intake    243 ml  Output   2010 ml  Net  -1767 ml   Filed Weights   12/08/13 0358 12/08/13 0830 12/08/13 1235  Weight: 62.234 kg (137 lb 3.2 oz) 64.2 kg (141 lb 8.6 oz) 62.1 kg (136 lb 14.5 oz)    Exam:   General:  NAD  Cardiovascular: RRR  Respiratory: CTAB  Abdomen: Soft, nontender, nondistended, positive bowel sounds  Musculoskeletal: No clubbing cyanosis or edema  Data Reviewed: Basic Metabolic Panel:  Recent Labs Lab 12/05/13 2303 12/06/13 0437 12/07/13 0628 12/08/13 0500  NA 140 143 139 138  K 4.9 5.2 4.1 3.4*  CL 103 103 99 100  CO2 19 23 24 24   GLUCOSE 250* 150* 112* 84  BUN 33* 39* 26* 37*  CREATININE 4.00* 4.75* 3.51* 4.45*  CALCIUM 6.8* 7.9* 8.1* 7.7*  PHOS  --   --   --  4.8*   Liver Function Tests:  Recent Labs Lab 12/06/13 0437 12/07/13 0628  AST 17 18  ALT 12 12  ALKPHOS 76 76  BILITOT 0.5 0.7  PROT 5.0* 5.0*  ALBUMIN 1.9* 2.0*   No results found for this basename: LIPASE, AMYLASE,  in the last 168 hours No results found for this basename: AMMONIA,  in the last 168 hours CBC:  Recent Labs Lab 12/06/13 0437 12/07/13 0500 12/08/13 0500 12/09/13 0550  WBC 13.5* 20.0* 17.4* 14.2*  HGB 11.3* 11.7* 10.8* 11.0*  HCT 36.4* 36.5* 33.2* 35.8*  MCV 89.2 88.8 86.2 90.2  PLT 127* 85* 91* 89*   Cardiac Enzymes:  Recent Labs Lab 12/05/13 1846 12/05/13 2303 12/06/13 0437  TROPONINI <0.30 <0.30 <0.30   BNP (last 3 results)  Recent Labs  06/08/13 2145 11/04/13 2328  PROBNP 16467.0* 13759.0*   CBG:  Recent Labs Lab 12/08/13 1844 12/08/13 2013 12/08/13 2207 12/09/13 0748 12/09/13 1131  GLUCAP 132* 131* 139* 85 84    Recent Results (from the past 240 hour(s))  MRSA PCR SCREENING     Status: None   Collection Time    12/05/13  4:35 PM      Result Value Ref Range Status   MRSA by PCR NEGATIVE  NEGATIVE Final   Comment:            The GeneXpert MRSA Assay (FDA     approved for NASAL specimens      only), is one component of a     comprehensive MRSA colonization     surveillance program. It is not     intended to diagnose MRSA     infection nor to guide or     monitor treatment for     MRSA infections.  CULTURE, BLOOD (ROUTINE X 2)     Status: None   Collection Time    12/05/13  6:40 PM      Result Value Ref Range Status   Specimen Description BLOOD LEFT ARM   Final   Special Requests BOTTLES DRAWN AEROBIC AND ANAEROBIC 10CC   Final   Culture  Setup Time     Final   Value: 12/06/2013 00:50     Performed at [facility]   Culture     Final   Value:  BLOOD CULTURE RECEIVED NO GROWTH TO DATE CULTURE WILL BE HELD FOR 5 DAYS BEFORE ISSUING A FINAL NEGATIVE REPORT     Performed at [facility]   Report Status PENDING   Incomplete  CULTURE, BLOOD (ROUTINE X 2)     Status: None   Collection Time    12/05/13  6:54 PM      Result Value Ref Range Status   Specimen Description BLOOD LEFT ARM   Final   Special Requests     Final   Value: BOTTLES DRAWN AEROBIC AND ANAEROBIC BLUE 10CC RED 5CC   Culture  Setup Time     Final   Value: 12/06/2013 00:50     Performed at [facility]   Culture     Final   Value:        BLOOD CULTURE RECEIVED NO GROWTH TO DATE CULTURE WILL BE HELD FOR 5 DAYS BEFORE ISSUING A FINAL NEGATIVE REPORT     Performed at [facility]   Report Status PENDING   Incomplete  URINE CULTURE     Status: None   Collection Time    12/07/13  6:51 PM      Result Value Ref Range Status   Specimen Description URINE, RANDOM   Final   Special Requests NONE   Final   Culture  Setup Time     Final   Value: 12/07/2013 20:16     Performed at [facility]     Final   Value: NO GROWTH     Performed at [facility]   Culture     Final   Value: NO GROWTH     Performed at [facility]   Report Status 12/08/2013 FINAL   Final     Studies: Nm Pulmonary Perf And Vent  12/07/2013   CLINICAL DATA:   Shortness of breath.  EXAM: NUCLEAR MEDICINE VENTILATION - PERFUSION LUNG SCAN  TECHNIQUE: Ventilation images were obtained in multiple projections using inhaled aerosol technetium 99 M DTPA. Perfusion images were obtained in multiple projections after intravenous injection of Tc-65mMAA.  RADIOPHARMACEUTICALS:  40 mCi Tc-950mTPA aerosol and 6 mCi Tc-9925mA  COMPARISON:  Chest radiograph 12/05/2013  FINDINGS: Ventilation: The ventilation images are very limited due to clumping of the radiopharmaceutical throughout both lungs.  Perfusion: No wedge shaped peripheral perfusion defects to suggest acute pulmonary embolism. There is a photopenic defect in the left chest related to the cardiac pacemaker. There may be artifact from an arm on the right lateral perfusion images. Costophrenic angles are not sharp on some of the perfusion images but no large perfusion abnormality in these areas.  IMPRESSION: Very low probability for pulmonary embolism.   Electronically Signed   By: AdaMarkus DaftD.   On: 12/07/2013 15:50   Dg Chest Port 1 View  12/09/2013   CLINICAL DATA:  New dyspnea.  On antibiotics for pneumonia.  EXAM: PORTABLE CHEST - 1 VIEW  COMPARISON:  12/05/2013  FINDINGS: Two lead left-sided pacemaker is unchanged. Right IJ such a venous catheter is unchanged with tip over the right atrium. Lungs are adequately inflated with persistent mild bibasilar opacification with slight worsening in the left base which may be due to atelectasis or infection. Cardiomediastinal silhouette and remainder of the exam is unchanged.  IMPRESSION: Persistent mild bibasilar opacification with slight worsening in the left base which may be due to atelectasis or infection.  Right IJ central venous catheter  unchanged.   Electronically Signed   By: Marin Olp M.D.   On: 12/09/2013 10:55    Scheduled Meds: . acyclovir  400 mg Oral Daily  . amiodarone  200 mg Oral Daily  . antiseptic oral rinse  7 mL Mouth Rinse BID  . aspirin  81  mg Oral Daily  . dexamethasone  40 mg Oral Q Tue  . docusate sodium  100 mg Oral BID  . heparin  2,000 Units Dialysis Once in dialysis  . insulin aspart  0-20 Units Subcutaneous TID WC  . ipratropium-albuterol  3 mL Nebulization TID  . [START ON 12/10/2013] levofloxacin  500 mg Oral Q48H  . megestrol  800 mg Oral Daily  . midodrine  10 mg Oral TID WC  . multivitamin  1 tablet Oral QHS  . sodium chloride  3 mL Intravenous Q12H  . tamsulosin  0.4 mg Oral Daily   Continuous Infusions:    Active Problems:   Multiple myeloma   HCAP (healthcare-associated pneumonia)   Acute respiratory failure with hypoxia   CKD (chronic kidney disease) stage V requiring chronic dialysis   Decubitus ulcer of sacral region, stage 4   Bilateral lower extremity edema   Physical deconditioning    Time spent: [place] MD [facility] Pager [phone]. If 7PM-7AM, please contact night-coverage at [email or web address], password [facility] 12/09/2013, 11:52 AM  LOS: 4 days

## 2013-12-09 NOTE — Progress Notes (Signed)
Pt's HR elevated and noted to be in Afib on the monitor. BP 113/57 with Hr 100-120's. 12 lead EKG done confirming conversion. Jonette Eva, NP notified and orders for Metoprolol and troponins entered. Pt given 4mg  of IV Metoprolol and Hr decreased to 60's and converted to SR. Jonette Eva, NP notified. Will cont to monitor closely.

## 2013-12-09 NOTE — Progress Notes (Addendum)
  [facility] Progress Note   Subjective: gets SOB with minimal activity per pt and wife, some coughing, not SOB at rest  Filed Vitals:   12/08/13 2015 12/08/13 2049 12/09/13 0500 12/09/13 0749  BP: 95/50  118/57   Pulse: 79  65   Temp: 97 F (36.1 C)  97.8 F (36.6 C)   TempSrc: Oral  Oral   Resp: 18  18   Height:      Weight:      SpO2: 96% 95% 94% 94%   Exam: Elderly WM no distress No jvd Chest w coarse rales 1/2 up on R and at L base RRR no MRG Abd soft, NTND No LE edema Neuro is alert, nf Sacral area with 3 x 3 cm deep, clean decub ulcer, no drainage R IJ perm cath w clean exit site, RUA AVF +bruit immature  HD: MWF [place] 4h  63kg   3K/2.25 Bath  Heparin 2000   R IJ cath / RUA BVT ("not using AVF per Dr Justin Mend currently sec to FTT) 400/800 Aranesp 150 ug q Wed, Venofer none, Vit D none       Assessment: 1 Acute resp failure / cough / ^WBC - suspected LLL PNA ; more SOB today and exam worse, will order CXR 2 ESRD on HD 3 Mult myeloma - on decadron only here 4 Sacral decub, stage IV - pt had VAC removed 1 week ago d/t intolerance from pain 5 Anemia cont aranesp 6 HPTH no vit D, not on binder, P 4.8 7 Afib on amio 8 Chronic hypotension on midodrine - at dry wt, no vol excess on exam  Plan- get CXR    Kelly Splinter MD  pager [phone]    cell [phone]  12/09/2013, 8:36 AM     Recent Labs Lab 12/06/13 0437 12/07/13 0628 12/08/13 0500  NA 143 139 138  K 5.2 4.1 3.4*  CL 103 99 100  CO2 23 24 24   GLUCOSE 150* 112* 84  BUN 39* 26* 37*  CREATININE 4.75* 3.51* 4.45*  CALCIUM 7.9* 8.1* 7.7*  PHOS  --   --  4.8*    Recent Labs Lab 12/06/13 0437 12/07/13 0628  AST 17 18  ALT 12 12  ALKPHOS 76 76  BILITOT 0.5 0.7  PROT 5.0* 5.0*  ALBUMIN 1.9* 2.0*    Recent Labs Lab 12/07/13 0500 12/08/13 0500 12/09/13 0550  WBC 20.0* 17.4* 14.2*  HGB 11.7* 10.8* 11.0*  HCT 36.5* 33.2* 35.8*  MCV 88.8 86.2 90.2  PLT 85* 91* 89*   .  acyclovir  400 mg Oral Daily  . amiodarone  200 mg Oral Daily  . antiseptic oral rinse  7 mL Mouth Rinse BID  . aspirin  81 mg Oral Daily  . dexamethasone  40 mg Oral Q Tue  . docusate sodium  100 mg Oral BID  . heparin  2,000 Units Dialysis Once in dialysis  . insulin aspart  0-20 Units Subcutaneous TID WC  . ipratropium-albuterol  3 mL Nebulization TID  . [START ON 12/10/2013] levofloxacin  500 mg Oral Q48H  . megestrol  800 mg Oral Daily  . midodrine  10 mg Oral TID WC  . multivitamin  1 tablet Oral QHS  . sodium chloride  3 mL Intravenous Q12H  . tamsulosin  0.4 mg Oral Daily     sodium chloride, acetaminophen, acetaminophen, albuterol, feeding supplement (NEPRO CARB STEADY), heparin, HYDROcodone-acetaminophen, lidocaine (PF), sodium chloride

## 2013-12-09 NOTE — Progress Notes (Signed)
Pt dressing to buttock removed/ soiled. I attempted to redress per order, however pt refused at this time. Ne w pad underneath, pt educated, will cont to monitor.

## 2013-12-10 DIAGNOSIS — I82402 Acute embolism and thrombosis of unspecified deep veins of left lower extremity: Secondary | ICD-10-CM | POA: Diagnosis present

## 2013-12-10 LAB — CBC
HCT: 35.7 % — ABNORMAL LOW (ref 39.0–52.0)
HEMOGLOBIN: 11.1 g/dL — AB (ref 13.0–17.0)
MCH: 27.3 pg (ref 26.0–34.0)
MCHC: 31.1 g/dL (ref 30.0–36.0)
MCV: 87.7 fL (ref 78.0–100.0)
Platelets: 114 10*3/uL — ABNORMAL LOW (ref 150–400)
RBC: 4.07 MIL/uL — ABNORMAL LOW (ref 4.22–5.81)
RDW: 18.9 % — AB (ref 11.5–15.5)
WBC: 16 10*3/uL — ABNORMAL HIGH (ref 4.0–10.5)

## 2013-12-10 LAB — BASIC METABOLIC PANEL
Anion gap: 15 (ref 5–15)
BUN: 32 mg/dL — ABNORMAL HIGH (ref 6–23)
CO2: 23 mEq/L (ref 19–32)
CREATININE: 4.06 mg/dL — AB (ref 0.50–1.35)
Calcium: 8 mg/dL — ABNORMAL LOW (ref 8.4–10.5)
Chloride: 101 mEq/L (ref 96–112)
GFR, EST AFRICAN AMERICAN: 16 mL/min — AB (ref >90)
GFR, EST NON AFRICAN AMERICAN: 14 mL/min — AB (ref >90)
GLUCOSE: 52 mg/dL — AB (ref 70–99)
POTASSIUM: 4.7 meq/L (ref 3.7–5.3)
Sodium: 139 mEq/L (ref 137–147)

## 2013-12-10 LAB — GLUCOSE, CAPILLARY: Glucose-Capillary: 71 mg/dL (ref 70–99)

## 2013-12-10 LAB — TROPONIN I: Troponin I: <0.3 ng/mL (ref <0.30)

## 2013-12-10 MED ORDER — DSS 100 MG PO CAPS: 100.0000 mg | ORAL_CAPSULE | Freq: Two times a day (BID) | ORAL | Status: AC

## 2013-12-10 MED ORDER — RENA-VITE PO TABS: 1.0000 | ORAL_TABLET | Freq: Every day | ORAL | Status: AC

## 2013-12-10 MED ORDER — ASPIRIN 81 MG PO CHEW: 81.0000 mg | CHEWABLE_TABLET | Freq: Every day | ORAL | Status: AC

## 2013-12-10 MED ORDER — IPRATROPIUM-ALBUTEROL 0.5-2.5 (3) MG/3ML IN SOLN: 3.0000 mL | RESPIRATORY_TRACT | Status: AC | PRN

## 2013-12-10 NOTE — Progress Notes (Signed)
Pt does this therapy on his own.

## 2013-12-10 NOTE — Plan of Care (Signed)
Problem: Discharge Progression Outcomes Goal: Discharge plan in place and appropriate Outcome: Completed/Met Date Met:  12/10/13 Pt demanding to leave, threatening to leave AMA pt discharged home in hopes that setting up outside resources, pt will be able to cont to progress and PNA will resolve. This is not optimal d/c but best that can be done in accordance with pt desires

## 2013-12-10 NOTE — Discharge Summary (Signed)
Physician Discharge Summary  Scott [name] QZE:[id number] DOB: [birth year] DOA: 12/05/2013  PCP: No primary provider on file.  Admit date: 12/05/2013 Discharge date: 12/10/2013  Time spent: 70 minutes  Recommendations for Outpatient Follow-up:  1. Followup at hemodialysis on Monday, 12/11/2013. Patient will require IV vancomycin and IV Fortaz during hemodialysis for 5 more days last dose on Friday, 12/15/2013. 2. Followup with PCP this week. Patient will need repeat CT of the chest done in one month for resolution of pneumonia noted on chest CT. 3. Followup with hematologist/ oncologist as previously scheduled on 12/14/2013. 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 Further management per hematology.  Discharge Diagnoses:  Principal Problem:   Acute respiratory failure with hypoxia Active Problems:   HCAP (healthcare-associated pneumonia)   Multiple myeloma   CKD (chronic kidney disease) stage V requiring chronic dialysis   Decubitus ulcer of sacral region, stage 4   Bilateral lower extremity edema   Physical deconditioning   Left leg DVT: Chronic per dopplers   Discharge Condition: Stable  Diet recommendation: Low-salt heart healthy/renal diet  Filed Weights   12/08/13 0830 12/08/13 1235 12/10/13 0559  Weight: 64.2 kg (141 lb 8.6 oz) 62.1 kg (136 lb 14.5 oz) 63.3 kg (139 lb 8.8 oz)    History of present illness:  Scott [name] is a 73 y.o. male with PMH significant for Multiple Myeloma now on Dialysis since June, recently left the hospital against medical advised on 9-13. He was receiving treatment at that time for PNA. He was still taking Levaquin. He was a transferred from [place] for further evaluation. PAtient presented to  with worsening SOB, even at rest. He denied chest pain. He was still coughing but was not getting worse. He related that he almost pass out on Monday  prior to admission when he finished dialysis.  Out side Facility test ; Chest X ray ; Left bibasilar atelectasis or infiltrate. No pulmonary edema. PH 7.5, PCO 2 28, Po2 66, K 5.4, Cr 4.2. WBC 16.  Patient appearred comfortable on 2 L of oxygen.    Hospital Course:  Brief narrative:  73 year male patient transferred from [facility] emergency room with pneumonia. He presented with shortness of breath, cough, weakness and was found to be hypoxic. Chest x-ray noted left bibasilar atelectasis or platelike infiltrate but no pulmonary edema. Patient recently hospitalized for pneumonia from 9/12 to 9/13 but left AMA but apparently did receive antibiotics which he reportedly had continued to take prior to this admission. He had not missed any hemodialysis treatments. He was not normally on oxygen at home.  After arrival to [facility] he appeared comfortable on 2 L of oxygen. He was noted to have bilateral lower extremity edema greater on the left side concerning or possible DVT with associated PE in the differential. He was hemodynamically stable. He did have a leukocytosis of 13,000.  Since admission Dr. Thereasa Solo had discussion with patient and wife at bedside. Patient's baseline mobility is out of bed to wheelchair with walker in front of him and he uses the walker to propel himself while seated in the wheelchair. Patient seems very unhappy with his current clinical status but defers to his wife in regards to making all medical decisions. Stated is scheduled to see his Oncologist on October 1. Again asking when he can return to home. Explained to patient and wife rationale for Dopplers and  VQ scan. Also discussed with them the possibility that patient may benefit from home O2 if he qualifies.    #1 Acute Respiratory Failure with hypoxia/?HCAP/Grade 3 diastolic dysfunction  Patient had presented to outside hospital on [facility] with shortness of breath cough and weakness and noted to be hypoxic. Chest  x-ray which was done at that time was consistent with a pneumonia. Patient had recently been hospitalized from 9/12-9/13 , left AMA. Patient was subsequently transferred to [facility] for further management. Patient was initially placed in the step down unit and placed empirically on IV vancomycin IV cefepime. V/Q scan was done which showed low probability for PE. Lower extremity Dopplers were negative for DVT in the right lower extremity however shows a chronic left DVT in the proximal gastrocnemius vein. Patient improved slowly and was subsequently transitioned to oral Levaquin. Was on oral Levaquin patient did have some worsening shortness of breath and was reassessed. ABG was obtained showed a pH of 7.5 PCO2 of 31 PO2 of 82 bicarbonate 25. Chest x-ray was also obtained which showed persistent mild bibasilar opacification with slight worsening in the left base which may be due to atelectasis or infection. CT of the chest was subsequently obtained which showed patchy parenchymal densities throughout both lungs most compatible with an infectious or inflammatory process. Consider followup CT in 4 weeks. Patient with some improvement clinically.  Patient was intent/insistent on being discharged on 12/10/2013, otherwise he would leave AMA. Patient was subsequently discharged and will need to be on IV vancomycin and IV Fortaz for the next 5 days which will be given at hemodialysis. This was discussed with Dr. Melvia Heaps of nephrology. Patient will be discharged in stable condition. Patient is to followup with PCP as outpatient. #2 chronic kidney disease stage V on hemodialysis  During the hospitalization nephrology consultation was obtained. Patient was followed by nephrology during the hospitalization and maintained on his hemodialysis schedule. Patient is to followup with his outpatient dialysis center where patient will need to be placed on IV vancomycin and IV Fortaz for the next 5 days to complete a  ten-day course of antibiotic therapy.  #3 diabetes mellitus  Patient has been on Decadron once weekly. Hemoglobin A1c 5. Patient was maintained on a sliding scale insulin during the hospitalization.  #4 history of V. Fib  On amiodarone. X-ray reveals AICD. 2-D echo June 2015 with nl LV systolic function and grade 3 diastolic dysfunction.  #5 bilateral lower extremity edema  Lower extremity Dopplers with old DVT noted on the left. Patient had initially been on IV heparin was Dopplers were pending. IV heparin was subsequently discontinued. Discussed with hematology and started patient on baby aspirin. Patient is to followup with his primary hematologist as outpatient.  #6 history of multiple myeloma  Continued on weekly Decadron and Zovirax.  #7 decubitus ulcer sacral region stage IV  Chronic in nature. Patient was seen by the wound care nurse and recommendations for wound care were done during the hospitalization.  #8 chronic left DVT  Per LE doppler. Patient with history of multiple myeloma, was on IV heparin while VQ scan and lower extremity Dopplers were pending. Lower extremity Dopplers showed a chronic left DVT. Discussed with hematology and placed patient on ASA 81 mg daily. IV heparin was subsequently discontinued. Outpatient followup with primary hematologist.        Procedures: Lower extremity Dopplers 12/07/2013  VQ scan 12/07/2013  X-ray 12/05/2013, CXR 12/09/13 CT Chest 12/09/13  Consultations: Nephrology:Dr Melvia Heaps 12/06/2013  Discharge Exam: Filed Vitals:   12/10/13 0559  BP: 116/56  Pulse: 69  Temp: 97.9 F (36.6 C)  Resp: 20    General: NAD Cardiovascular: RRR Respiratory: CTAB. No wheezing  Discharge Instructions You were cared for by a hospitalist during your hospital stay. If you have any questions about your discharge medications or the care you received while you were in the hospital after you are discharged, you can call the unit and asked to speak with  the hospitalist on call if the hospitalist that took care of you is not available. Once you are discharged, your primary care physician will handle any further medical issues. Please note that NO REFILLS for any discharge medications will be authorized once you are discharged, as it is imperative that you return to your primary care physician (or establish a relationship with a primary care physician if you do not have one) for your aftercare needs so that they can reassess your need for medications and monitor your lab values.  Discharge Instructions   Diet - low sodium heart healthy    Complete by:  As directed   Renal diet     Discharge instructions    Complete by:  As directed   Follow up at dialysis unit. Follow up with PCP this week. Follow up with hematology/oncologist as scheduled 12/14/13     Increase activity slowly    Complete by:  As directed           Discharge Medication List as of 12/10/2013 10:32 AM    START taking these medications   Details  aspirin 81 MG chewable tablet Chew 1 tablet (81 mg total) by mouth daily., Starting 12/10/2013, Until Discontinued, No Print    docusate sodium 100 MG CAPS Take 100 mg by mouth 2 (two) times daily., Starting 12/10/2013, Until Discontinued, No Print    multivitamin (RENA-VIT) TABS tablet Take 1 tablet by mouth at bedtime., Starting 12/10/2013, Until Discontinued, No Print      CONTINUE these medications which have CHANGED   Details  ipratropium-albuterol (DUONEB) 0.5-2.5 (3) MG/3ML SOLN Take 3 mLs by nebulization every 4 (four) hours as needed (for shortness of breath). Use 3 times daily for 5 days then stop., Starting 12/10/2013, Until Discontinued, Print      CONTINUE these medications which have NOT CHANGED   Details  acyclovir (ZOVIRAX) 400 MG tablet Take 400 mg by mouth daily., Until Discontinued, Historical Med    amiodarone (PACERONE) 200 MG tablet Take 200 mg by mouth daily., Until Discontinued, Historical Med     dexamethasone (DECADRON) 4 MG tablet Take 40 mg by mouth every 7 (seven) days. Every Tuesday., Until Discontinued, Historical Med    ferrous sulfate 325 (65 FE) MG tablet Take 325 mg by mouth daily with breakfast., Until Discontinued, Historical Med    HYDROcodone-acetaminophen (NORCO/VICODIN) 5-325 MG per tablet Take 1 tablet by mouth every 6 (six) hours as needed for moderate pain. , Until Discontinued, Historical Med    megestrol (MEGACE) 40 MG/ML suspension Take 800 mg by mouth daily. , Until Discontinued, Historical Med    midodrine (PROAMATINE) 10 MG tablet Take 1 tablet (10 mg total) by mouth 3 (three) times daily with meals., Starting 09/07/2013, Until Discontinued, Normal    tamsulosin (FLOMAX) 0.4 MG CAPS capsule Take 0.4 mg by mouth daily., Until Discontinued, Historical Med    ACCU-CHEK AVIVA PLUS test strip 1 each by Other route See admin instructions. Check blood sugar once daily., Starting 05/31/2013, Until Discontinued,  Historical Med    ACCU-CHEK SOFTCLIX LANCETS lancets 1 each by Other route See admin instructions. Check blood sugar once daily., Starting 05/31/2013, Until Discontinued, Historical Med      STOP taking these medications     levofloxacin (LEVAQUIN) 500 MG tablet        No Known Allergies Follow-up Information   Follow up with [facility]. (Registered Nurse and Physical Therapy)    Specialty:  [place] information:   [address] [place] [address] [phone]       Schedule an appointment as soon as possible for a visit in 1 week to follow up. (f/u with PCP)       Follow up On 12/14/2013. (f/u with hematologist/oncologist as scheduled)       Follow up On 12/11/2013. (f/u in dialysis tommorrow)        The results of significant diagnostics from this hospitalization (including imaging, microbiology, ancillary and laboratory) are listed below for reference.    Significant Diagnostic Studies: Dg Chest 2  View  12/05/2013   CLINICAL DATA:  Increased shortness of breath  EXAM: CHEST  2 VIEW  COMPARISON:  12/05/2013  FINDINGS: There are low lung volumes. There is a dual ligament right-sided central venous catheter in unchanged position. There is a dual lead AICD. There is no focal parenchymal opacity, pleural effusion, or pneumothorax. The heart and mediastinal contours are unremarkable.  The osseous structures are unremarkable.  IMPRESSION: No active cardiopulmonary disease.   Electronically Signed   By: Kathreen Devoid   On: 12/05/2013 20:17   Ct Chest Wo Contrast  12/09/2013   CLINICAL DATA:  Shortness of breath.  EXAM: CT CHEST WITHOUT CONTRAST  TECHNIQUE: Multidetector CT imaging of the chest was performed following the standard protocol without IV contrast.  COMPARISON:  Chest radiograph 12/09/2013 and CT 12/31/2012  FINDINGS: Patient has a left cardiac pacemaker. There is a dialysis catheter and the tip extends into the upper right atrium. Small lymph nodes in the mediastinum are unchanged. Ascending thoracic aorta measures 3.8 cm. There are coronary artery calcifications. Small amount of pericardial fluid.  There is an exophytic hyperdense structure involving the anterior right hepatic lobe that measures 1.6 cm. This could represent an proteinaceous or hemorrhagic cyst. Patient has renal cysts which are incompletely evaluated. No acute abnormality in the upper abdomen. Small amount of right pleural fluid.  The trachea and mainstem bronchi are patent. There are scattered ground-glass densities in the upper lungs. Some of the lung densities have a tree-in-bud configuration and suggest an infectious or inflammatory etiology. There is a focal nodular lesion in the right lower lobe on sequence 3, image 35 that measures 1 cm. This lesion has a solid component and surrounding hazy densities. There is consolidation in the posterior left lower lobe and a small amount of consolidation in the posterior right lower lobe.  No acute bone abnormality.  IMPRESSION: Patchy parenchymal densities throughout both lungs. The morphology of these densities are most compatible with an infectious or inflammatory process. These densities are new from 12/31/2012. Consider a follow-up CT in 4 weeks to ensure resolution of the larger nodular densities, particularly the lesion in the right lower lobe. The consolidation at the lung bases could also be associated with aspiration.  Exophytic hyperdense structure involving the right hepatic lobe. This is indeterminate but could represent a hemorrhagic or proteinaceous cyst. Evidence for bilateral renal cysts.   Electronically Signed   By: Quita Skye  Anselm Pancoast M.D.   On: 12/09/2013 14:38   Nm Pulmonary Perf And Vent  12/07/2013   CLINICAL DATA:  Shortness of breath.  EXAM: NUCLEAR MEDICINE VENTILATION - PERFUSION LUNG SCAN  TECHNIQUE: Ventilation images were obtained in multiple projections using inhaled aerosol technetium 99 M DTPA. Perfusion images were obtained in multiple projections after intravenous injection of Tc-81mMAA.  RADIOPHARMACEUTICALS:  40 mCi Tc-936mTPA aerosol and 6 mCi Tc-9947mA  COMPARISON:  Chest radiograph 12/05/2013  FINDINGS: Ventilation: The ventilation images are very limited due to clumping of the radiopharmaceutical throughout both lungs.  Perfusion: No wedge shaped peripheral perfusion defects to suggest acute pulmonary embolism. There is a photopenic defect in the left chest related to the cardiac pacemaker. There may be artifact from an arm on the right lateral perfusion images. Costophrenic angles are not sharp on some of the perfusion images but no large perfusion abnormality in these areas.  IMPRESSION: Very low probability for pulmonary embolism.   Electronically Signed   By: AdaMarkus DaftD.   On: 12/07/2013 15:50   Dg Chest Port 1 View  12/09/2013   CLINICAL DATA:  New dyspnea.  On antibiotics for pneumonia.  EXAM: PORTABLE CHEST - 1 VIEW  COMPARISON:  12/05/2013   FINDINGS: Two lead left-sided pacemaker is unchanged. Right IJ such a venous catheter is unchanged with tip over the right atrium. Lungs are adequately inflated with persistent mild bibasilar opacification with slight worsening in the left base which may be due to atelectasis or infection. Cardiomediastinal silhouette and remainder of the exam is unchanged.  IMPRESSION: Persistent mild bibasilar opacification with slight worsening in the left base which may be due to atelectasis or infection.  Right IJ central venous catheter unchanged.   Electronically Signed   By: DanMarin OlpD.   On: 12/09/2013 10:55   Dg Chest Port 1 View  11/25/2013   CLINICAL DATA:  Code sepsis.  Fever.  EXAM: PORTABLE CHEST - 1 VIEW  COMPARISON:  Chest radiograph performed 11/04/2013  FINDINGS: The lungs are well-aerated. Retrocardiac airspace opacity raises concern for pneumonia, given the patient's symptoms. There is no evidence of pleural effusion or pneumothorax.  The cardiomediastinal silhouette is borderline normal in size. A pacemaker is noted overlying the left chest wall, with leads ending overlying the right atrium and right ventricle. A right-sided dual-lumen catheter is noted ending about the cavoatrial junction. No acute osseous abnormalities are seen.  IMPRESSION: Retrocardiac airspace opacification raises concern for pneumonia.   Electronically Signed   By: JefGarald BaldingD.   On: 11/25/2013 04:10    Microbiology: Recent Results (from the past 240 hour(s))  MRSA PCR SCREENING     Status: None   Collection Time    12/05/13  4:35 PM      Result Value Ref Range Status   MRSA by PCR NEGATIVE  NEGATIVE Final   Comment:            The GeneXpert MRSA Assay (FDA     approved for NASAL specimens     only), is one component of a     comprehensive MRSA colonization     surveillance program. It is not     intended to diagnose MRSA     infection nor to guide or     monitor treatment for     MRSA infections.   CULTURE, BLOOD (ROUTINE X 2)     Status: None   Collection Time    12/05/13  6:40 PM  Result Value Ref Range Status   Specimen Description BLOOD LEFT ARM   Final   Special Requests BOTTLES DRAWN AEROBIC AND ANAEROBIC 10CC   Final   Culture  Setup Time     Final   Value: 12/06/2013 00:50     Performed at [facility]   Culture     Final   Value:        BLOOD CULTURE RECEIVED NO GROWTH TO DATE CULTURE WILL BE HELD FOR 5 DAYS BEFORE ISSUING A FINAL NEGATIVE REPORT     Performed at [facility]   Report Status PENDING   Incomplete  CULTURE, BLOOD (ROUTINE X 2)     Status: None   Collection Time    12/05/13  6:54 PM      Result Value Ref Range Status   Specimen Description BLOOD LEFT ARM   Final   Special Requests     Final   Value: BOTTLES DRAWN AEROBIC AND ANAEROBIC BLUE 10CC RED 5CC   Culture  Setup Time     Final   Value: 12/06/2013 00:50     Performed at [facility]   Culture     Final   Value:        BLOOD CULTURE RECEIVED NO GROWTH TO DATE CULTURE WILL BE HELD FOR 5 DAYS BEFORE ISSUING A FINAL NEGATIVE REPORT     Performed at [facility]   Report Status PENDING   Incomplete  URINE CULTURE     Status: None   Collection Time    12/07/13  6:51 PM      Result Value Ref Range Status   Specimen Description URINE, RANDOM   Final   Special Requests NONE   Final   Culture  Setup Time     Final   Value: 12/07/2013 20:16     Performed at [place]     Final   Value: NO GROWTH     Performed at [facility]   Culture     Final   Value: NO GROWTH     Performed at [facility]   Report Status 12/08/2013 FINAL   Final     Labs: Basic Metabolic Panel:  Recent Labs Lab 12/05/13 2303 12/06/13 0437 12/07/13 0628 12/08/13 0500 12/10/13 0447  NA 140 143 139 138 139  K 4.9 5.2 4.1 3.4* 4.7  CL 103 103 99 100 101  CO2 _0 GLUCOSE 250* 150* 112* 84 52*  BUN 33* 39* 26* 37* 32*   CREATININE 4.00* 4.75* 3.51* 4.45* 4.06*  CALCIUM 6.8* 7.9* 8.1* 7.7* 8.0*  PHOS  --   --   --  4.8*  --    Liver Function Tests:  Recent Labs Lab 12/06/13 0437 12/07/13 0628  AST 17 18  ALT 12 12  ALKPHOS 76 76  BILITOT 0.5 0.7  PROT 5.0* 5.0*  ALBUMIN 1.9* 2.0*   No results found for this basename: LIPASE, AMYLASE,  in the last 168 hours No results found for this basename: AMMONIA,  in the last 168 hours CBC:  Recent Labs Lab 12/06/13 0437 12/07/13 0500 12/08/13 0500 12/09/13 0550 12/10/13 0447  WBC 13.5* 20.0* 17.4* 14.2* 16.0*  HGB 11.3* 11.7* 10.8* 11.0* 11.1*  HCT 36.4* 36.5* 33.2* 35.8* 35.7*  MCV 89.2 88.8 86.2 90.2 87.7  PLT 127* 85* 91* 89* 114*   Cardiac Enzymes:  Recent Labs Lab 12/05/13 1846 12/05/13 2303  12/06/13 0437 12/09/13 2237 12/10/13 0447  TROPONINI <0.30 <0.30 <0.30 <0.30 <0.30   BNP: BNP (last 3 results)  Recent Labs  06/08/13 2145 11/04/13 2328  PROBNP 16467.0* 13759.0*   CBG:  Recent Labs Lab 12/09/13 0748 12/09/13 1131 12/09/13 1644 12/09/13 2014 12/10/13 0748  GLUCAP 85 84 116* 94 71       Signed:  THOMPSON,DANIEL MD [facility] 12/10/2013, 11:22 AM

## 2013-12-12 LAB — CULTURE, BLOOD (ROUTINE X 2)
Culture: NO GROWTH
Culture: NO GROWTH

## 2013-12-14 ENCOUNTER — Encounter (HOSPITAL_BASED_OUTPATIENT_CLINIC_OR_DEPARTMENT_OTHER): Payer: Medicare HMO | Attending: Internal Medicine

## 2013-12-14 DIAGNOSIS — L89153 Pressure ulcer of sacral region, stage 3: Secondary | ICD-10-CM | POA: Insufficient documentation

## 2013-12-18 ENCOUNTER — Inpatient Hospital Stay (HOSPITAL_COMMUNITY)
Admission: EM | Admit: 2013-12-18 | Discharge: 2013-12-22 | DRG: 871 | Disposition: A | Discharge status: Home Health | Payer: Medicare HMO | Attending: Internal Medicine | Admitting: Internal Medicine

## 2013-12-18 ENCOUNTER — Encounter (HOSPITAL_COMMUNITY): Payer: Self-pay | Admitting: Emergency Medicine

## 2013-12-18 ENCOUNTER — Emergency Department (HOSPITAL_COMMUNITY): Payer: Medicare HMO

## 2013-12-18 DIAGNOSIS — F329 Major depressive disorder, single episode, unspecified: Secondary | ICD-10-CM | POA: Diagnosis present

## 2013-12-18 DIAGNOSIS — I82402 Acute embolism and thrombosis of unspecified deep veins of left lower extremity: Secondary | ICD-10-CM

## 2013-12-18 DIAGNOSIS — J441 Chronic obstructive pulmonary disease with (acute) exacerbation: Secondary | ICD-10-CM | POA: Diagnosis present

## 2013-12-18 DIAGNOSIS — R5381 Other malaise: Secondary | ICD-10-CM | POA: Diagnosis present

## 2013-12-18 DIAGNOSIS — R0600 Dyspnea, unspecified: Secondary | ICD-10-CM | POA: Diagnosis present

## 2013-12-18 DIAGNOSIS — J9601 Acute respiratory failure with hypoxia: Secondary | ICD-10-CM | POA: Diagnosis present

## 2013-12-18 DIAGNOSIS — N2581 Secondary hyperparathyroidism of renal origin: Secondary | ICD-10-CM | POA: Diagnosis present

## 2013-12-18 DIAGNOSIS — Z7952 Long term (current) use of systemic steroids: Secondary | ICD-10-CM | POA: Diagnosis not present

## 2013-12-18 DIAGNOSIS — J189 Pneumonia, unspecified organism: Secondary | ICD-10-CM | POA: Insufficient documentation

## 2013-12-18 DIAGNOSIS — R6520 Severe sepsis without septic shock: Secondary | ICD-10-CM | POA: Diagnosis present

## 2013-12-18 DIAGNOSIS — Z66 Do not resuscitate: Secondary | ICD-10-CM | POA: Diagnosis not present

## 2013-12-18 DIAGNOSIS — Z7982 Long term (current) use of aspirin: Secondary | ICD-10-CM | POA: Diagnosis not present

## 2013-12-18 DIAGNOSIS — C9000 Multiple myeloma not having achieved remission: Secondary | ICD-10-CM | POA: Diagnosis present

## 2013-12-18 DIAGNOSIS — I2699 Other pulmonary embolism without acute cor pulmonale: Secondary | ICD-10-CM | POA: Diagnosis present

## 2013-12-18 DIAGNOSIS — Z79818 Long term (current) use of other agents affecting estrogen receptors and estrogen levels: Secondary | ICD-10-CM | POA: Diagnosis not present

## 2013-12-18 DIAGNOSIS — Z85828 Personal history of other malignant neoplasm of skin: Secondary | ICD-10-CM | POA: Diagnosis not present

## 2013-12-18 DIAGNOSIS — Z6824 Body mass index (BMI) 24.0-24.9, adult: Secondary | ICD-10-CM | POA: Diagnosis not present

## 2013-12-18 DIAGNOSIS — E43 Unspecified severe protein-calorie malnutrition: Secondary | ICD-10-CM | POA: Diagnosis present

## 2013-12-18 DIAGNOSIS — R627 Adult failure to thrive: Secondary | ICD-10-CM | POA: Diagnosis present

## 2013-12-18 DIAGNOSIS — Z79899 Other long term (current) drug therapy: Secondary | ICD-10-CM | POA: Diagnosis not present

## 2013-12-18 DIAGNOSIS — Z992 Dependence on renal dialysis: Secondary | ICD-10-CM

## 2013-12-18 DIAGNOSIS — E119 Type 2 diabetes mellitus without complications: Secondary | ICD-10-CM | POA: Diagnosis present

## 2013-12-18 DIAGNOSIS — N186 End stage renal disease: Secondary | ICD-10-CM

## 2013-12-18 DIAGNOSIS — J181 Lobar pneumonia, unspecified organism: Secondary | ICD-10-CM

## 2013-12-18 DIAGNOSIS — Z515 Encounter for palliative care: Secondary | ICD-10-CM

## 2013-12-18 DIAGNOSIS — D649 Anemia, unspecified: Secondary | ICD-10-CM | POA: Diagnosis present

## 2013-12-18 DIAGNOSIS — A419 Sepsis, unspecified organism: Secondary | ICD-10-CM | POA: Diagnosis present

## 2013-12-18 DIAGNOSIS — F17210 Nicotine dependence, cigarettes, uncomplicated: Secondary | ICD-10-CM | POA: Diagnosis present

## 2013-12-18 DIAGNOSIS — I959 Hypotension, unspecified: Secondary | ICD-10-CM | POA: Diagnosis present

## 2013-12-18 DIAGNOSIS — I82592 Chronic embolism and thrombosis of other specified deep vein of left lower extremity: Secondary | ICD-10-CM | POA: Diagnosis present

## 2013-12-18 DIAGNOSIS — F32A Depression, unspecified: Secondary | ICD-10-CM

## 2013-12-18 DIAGNOSIS — L89154 Pressure ulcer of sacral region, stage 4: Secondary | ICD-10-CM | POA: Diagnosis present

## 2013-12-18 LAB — BASIC METABOLIC PANEL
Anion gap: 20 — ABNORMAL HIGH (ref 5–15)
BUN: 31 mg/dL — AB (ref 6–23)
CALCIUM: 8.5 mg/dL (ref 8.4–10.5)
CO2: 21 mEq/L (ref 19–32)
Chloride: 102 mEq/L (ref 96–112)
Creatinine, Ser: 5.11 mg/dL — ABNORMAL HIGH (ref 0.50–1.35)
GFR calc Af Amer: 12 mL/min — ABNORMAL LOW (ref >90)
GFR calc non Af Amer: 10 mL/min — ABNORMAL LOW (ref >90)
GLUCOSE: 99 mg/dL (ref 70–99)
Potassium: 4.3 mEq/L (ref 3.7–5.3)
Sodium: 143 mEq/L (ref 137–147)

## 2013-12-18 LAB — CBC
HCT: 43.5 % (ref 39.0–52.0)
Hemoglobin: 13.7 g/dL (ref 13.0–17.0)
MCH: 27.8 pg (ref 26.0–34.0)
MCHC: 31.5 g/dL (ref 30.0–36.0)
MCV: 88.4 fL (ref 78.0–100.0)
Platelets: 145 10*3/uL — ABNORMAL LOW (ref 150–400)
RBC: 4.92 MIL/uL (ref 4.22–5.81)
RDW: 18.9 % — ABNORMAL HIGH (ref 11.5–15.5)
WBC: 14.1 10*3/uL — ABNORMAL HIGH (ref 4.0–10.5)

## 2013-12-18 LAB — GLUCOSE, CAPILLARY
GLUCOSE-CAPILLARY: 63 mg/dL — AB (ref 70–99)
GLUCOSE-CAPILLARY: 84 mg/dL (ref 70–99)

## 2013-12-18 LAB — I-STAT TROPONIN, ED: Troponin i, poc: 0.11 ng/mL (ref 0.00–0.08)

## 2013-12-18 LAB — TROPONIN I

## 2013-12-18 MED ORDER — TAMSULOSIN HCL 0.4 MG PO CAPS
0.4000 mg | ORAL_CAPSULE | Freq: Every day | ORAL | Status: DC
  Administered 2013-12-19 – 2013-12-22 (×3): 0.4 mg via ORAL
  Filled 2013-12-18 (×5): qty 1

## 2013-12-18 MED ORDER — HYDROCODONE-ACETAMINOPHEN 5-325 MG PO TABS
1.0000 | ORAL_TABLET | Freq: Four times a day (QID) | ORAL | Status: DC | PRN
  Administered 2013-12-21 – 2013-12-22 (×2): 1 via ORAL
  Filled 2013-12-18 (×2): qty 1

## 2013-12-18 MED ORDER — INSULIN ASPART 100 UNIT/ML ~~LOC~~ SOLN
0.0000 [IU] | Freq: Three times a day (TID) | SUBCUTANEOUS | Status: DC
  Administered 2013-12-20: 1 [IU] via SUBCUTANEOUS
  Administered 2013-12-21: 2 [IU] via SUBCUTANEOUS
  Administered 2013-12-21: 3 [IU] via SUBCUTANEOUS

## 2013-12-18 MED ORDER — ACYCLOVIR 400 MG PO TABS
400.0000 mg | ORAL_TABLET | Freq: Every day | ORAL | Status: DC
  Administered 2013-12-19 – 2013-12-21 (×2): 400 mg via ORAL
  Filled 2013-12-18 (×4): qty 1

## 2013-12-18 MED ORDER — HEPARIN SODIUM (PORCINE) 1000 UNIT/ML DIALYSIS: 2000.0000 [IU] | Freq: Once | INTRAMUSCULAR | Status: DC

## 2013-12-18 MED ORDER — ALTEPLASE 2 MG IJ SOLR
2.0000 mg | Freq: Once | INTRAMUSCULAR | Status: DC | PRN
  Filled 2013-12-18: qty 2

## 2013-12-18 MED ORDER — MIDODRINE HCL 5 MG PO TABS
ORAL_TABLET | ORAL | Status: AC
  Administered 2013-12-18: 10 mg via ORAL
  Filled 2013-12-18: qty 2

## 2013-12-18 MED ORDER — LIDOCAINE-PRILOCAINE 2.5-2.5 % EX CREA
1.0000 "application " | TOPICAL_CREAM | CUTANEOUS | Status: DC | PRN
  Filled 2013-12-18: qty 5

## 2013-12-18 MED ORDER — ENOXAPARIN SODIUM 30 MG/0.3ML ~~LOC~~ SOLN
30.0000 mg | SUBCUTANEOUS | Status: DC
  Administered 2013-12-18: 30 mg via SUBCUTANEOUS
  Filled 2013-12-18 (×2): qty 0.3

## 2013-12-18 MED ORDER — IPRATROPIUM BROMIDE 0.02 % IN SOLN
0.5000 mg | Freq: Once | RESPIRATORY_TRACT | Status: AC
  Administered 2013-12-18: 0.5 mg via RESPIRATORY_TRACT
  Filled 2013-12-18: qty 2.5

## 2013-12-18 MED ORDER — DEXTROSE 5 % IV SOLN: 2.0000 g | INTRAVENOUS | Status: DC

## 2013-12-18 MED ORDER — NEPRO/CARBSTEADY PO LIQD
237.0000 mL | ORAL | Status: DC | PRN
  Filled 2013-12-18: qty 237

## 2013-12-18 MED ORDER — MIDODRINE HCL 5 MG PO TABS
10.0000 mg | ORAL_TABLET | Freq: Once | ORAL | Status: AC
  Administered 2013-12-18: 10 mg via ORAL

## 2013-12-18 MED ORDER — HEPARIN SODIUM (PORCINE) 1000 UNIT/ML DIALYSIS
1000.0000 [IU] | INTRAMUSCULAR | Status: DC | PRN
Start: 2013-12-18 — End: 2013-12-18

## 2013-12-18 MED ORDER — FERROUS SULFATE 325 (65 FE) MG PO TABS
325.0000 mg | ORAL_TABLET | Freq: Every day | ORAL | Status: DC
  Administered 2013-12-19 – 2013-12-22 (×4): 325 mg via ORAL
  Filled 2013-12-18 (×5): qty 1

## 2013-12-18 MED ORDER — PENTAFLUOROPROP-TETRAFLUOROETH EX AERO: 1.0000 "application " | INHALATION_SPRAY | CUTANEOUS | Status: DC | PRN

## 2013-12-18 MED ORDER — IPRATROPIUM-ALBUTEROL 0.5-2.5 (3) MG/3ML IN SOLN
3.0000 mL | RESPIRATORY_TRACT | Status: DC
  Administered 2013-12-19: 3 mL via RESPIRATORY_TRACT
  Filled 2013-12-18: qty 3

## 2013-12-18 MED ORDER — MEGESTROL ACETATE 40 MG/ML PO SUSP
800.0000 mg | Freq: Every day | ORAL | Status: DC
  Filled 2013-12-18 (×2): qty 20

## 2013-12-18 MED ORDER — ALBUTEROL SULFATE (2.5 MG/3ML) 0.083% IN NEBU
5.0000 mg | INHALATION_SOLUTION | Freq: Once | RESPIRATORY_TRACT | Status: AC
  Administered 2013-12-18: 5 mg via RESPIRATORY_TRACT
  Filled 2013-12-18: qty 6

## 2013-12-18 MED ORDER — CETYLPYRIDINIUM CHLORIDE 0.05 % MT LIQD
7.0000 mL | Freq: Two times a day (BID) | OROMUCOSAL | Status: DC
  Administered 2013-12-18 – 2013-12-21 (×2): 7 mL via OROMUCOSAL

## 2013-12-18 MED ORDER — LIDOCAINE HCL (PF) 1 % IJ SOLN: 5.0000 mL | INTRAMUSCULAR | Status: DC | PRN

## 2013-12-18 MED ORDER — SODIUM CHLORIDE 0.9 % IV SOLN: 100.0000 mL | INTRAVENOUS | Status: DC | PRN

## 2013-12-18 MED ORDER — ASPIRIN 81 MG PO CHEW
81.0000 mg | CHEWABLE_TABLET | Freq: Every day | ORAL | Status: DC
  Administered 2013-12-19 – 2013-12-21 (×2): 81 mg via ORAL
  Filled 2013-12-18 (×3): qty 1

## 2013-12-18 MED ORDER — DEXTROSE 5 % IV SOLN
1.0000 g | Freq: Three times a day (TID) | INTRAVENOUS | Status: DC
  Filled 2013-12-18 (×2): qty 1

## 2013-12-18 MED ORDER — MIDODRINE HCL 5 MG PO TABS
10.0000 mg | ORAL_TABLET | Freq: Three times a day (TID) | ORAL | Status: DC
  Administered 2013-12-19 – 2013-12-22 (×10): 10 mg via ORAL
  Filled 2013-12-18 (×13): qty 2

## 2013-12-18 MED ORDER — VANCOMYCIN HCL IN DEXTROSE 750-5 MG/150ML-% IV SOLN
750.0000 mg | INTRAVENOUS | Status: DC
  Administered 2013-12-20: 750 mg via INTRAVENOUS
  Filled 2013-12-18 (×2): qty 150

## 2013-12-18 MED ORDER — VANCOMYCIN HCL 10 G IV SOLR
1250.0000 mg | Freq: Once | INTRAVENOUS | Status: AC
  Administered 2013-12-19: 1250 mg via INTRAVENOUS
  Filled 2013-12-18: qty 1250

## 2013-12-18 MED ORDER — DEXTROSE 5 % IV SOLN
2.0000 g | Freq: Once | INTRAVENOUS | Status: AC
  Administered 2013-12-18: 2 g via INTRAVENOUS
  Filled 2013-12-18: qty 2

## 2013-12-18 MED ORDER — AMIODARONE HCL 200 MG PO TABS
200.0000 mg | ORAL_TABLET | Freq: Every day | ORAL | Status: DC
  Administered 2013-12-19 – 2013-12-21 (×2): 200 mg via ORAL
  Filled 2013-12-18 (×4): qty 1

## 2013-12-18 NOTE — ED Notes (Signed)
Doctor notified of i-stat troponin 0.11

## 2013-12-18 NOTE — Progress Notes (Signed)
Hemodialysis- 1L uf total during treatment d/t tachycardia/hypotension during last hour of treatment. Pt complained of slight chest pain relieved with saline bolus and uf off. No further complaints of pain. Sats remain 98-100% on 3L. BP 100/52, HR 104. Report called to 3E. Pt transported without complaint other than "Im just so cold", given warm blankets.

## 2013-12-18 NOTE — ED Notes (Signed)
Troponin results given to the primary nurse Marya Amsler

## 2013-12-18 NOTE — ED Notes (Signed)
Nephrology at bedside

## 2013-12-18 NOTE — Procedures (Signed)
I was present at this dialysis session, have reviewed the session itself and made  appropriate changes  Kelly Splinter MD (pgr) [phone]    ([phone] 12/18/2013, 6:43 PM

## 2013-12-18 NOTE — ED Notes (Signed)
Pt presents via EMS from dialysis clinic. He was unable to eat breakfast due to SOB and was so short of breath at dialysis that EMS was called. EMS gave him nebulizer trtmnt en route to hospital. Pt denies diaphoresis, n/v/d. Pt's general state of health is fragile, as he is dialysis patient with hx of diabetes, has pacemaker. Pt is scared and tearful.

## 2013-12-18 NOTE — ED Notes (Signed)
Pt called out c/o feeling sob, pt continues to maintain good O2 sats, pt still on 2liters/min oxygen with [place].

## 2013-12-18 NOTE — Progress Notes (Signed)
Pt arrived to floor in NAD. BP 86/50. HR 110-120, pt oriented to room and floor. CBG 63. Pt refusing to eat snack, will only drink mountain dew. MD on call K Schorr notified of BP and HR and CBG and no new orders right now, to continue to monitor BP and HR. Willl continue to monitor. Ronnette Hila, RN

## 2013-12-18 NOTE — Consult Note (Signed)
[facility] Renal Consultation Note  Indication for Consultation:  Management of ESRD/hemodialysis; anemia, hypertension/volume and secondary hyperparathyroidism  HPI: Scott [name] is a 73 y.o. male with a history of Diabetes type 2, COPD, multiple myeloma followed at [facility], atrial fibrillation, Stage IV sacral decubitus, and ESRD on dialysis at the [facility] who presented to his center this morning with worsening shortness of breath and weakness overnight.  EMS was called, and on arrival he was in respiratory distress with an oxygen saturation was 84% on 4 L.  He denies chest pain, fever, chills, nausea, vomiting, or diarrhea.  He was most recently hospitalized 9/22-27 for healthcare-associated pneumonia, and his chest x-ray today shows underlying emphysema with persistent infiltrate at the left base, but no edema.  Currently he is breathing better after a nebulizer treatment, but will require his scheduled dialysis as an inpatient.  Dialysis Orders:   MWF @ [facility] 4 hrs     63 kg      400/A1.5       2K/2.25Ca       Heparin 2000 U       R IJ catheter, R BVT (2nd stage 6/19) No Hectorol          Aranesp 120 mcg on Wed        No Venofer  Past Medical History  Diagnosis Date  . Pacemaker   . Shortness of breath     "when I take one of my chemo pills" (03/17/2013)  . Protein calorie malnutrition   . History of blood transfusion X 1    "count was too low"   . Cataract   . Hypertension   . COPD (chronic obstructive pulmonary disease)   . Pneumonia 06/09/2013; 08/2013; 10/2013; 11/2013  . Type II diabetes mellitus     no meds  . Arthritis     "fingers" (12/05/2013)  . ESRD (end stage renal disease) on dialysis     "MWF; [place]" (12/05/2013)  . Multiple myeloma   . Skin cancer of face     "once; left side of my face" (12/05/2013)   Past Surgical History  Procedure Laterality Date  . Appendectomy    . Partial nephrectomy Right 2000's    "cut out ~  1/2"   . Insert / replace / remove pacemaker  07/2012  . Bascilic vein transposition Right 07/10/2013    Procedure: BASILIC VEIN TRANSPOSITION- FIRST STAGE ;  Surgeon: Conrad [place], MD;  Location: [place];  Service: Vascular;  Laterality: Right;  . Insertion of dialysis catheter Right 08/28/2013    Procedure: INSERTION OF DIALYSIS CATHETER in Right Internal Jugular;  Surgeon: [name], MD;  Location: [place];  Service: Vascular;  Laterality: Right;  . Bascilic vein transposition Right 09/01/2013    Procedure: BASILIC VEIN TRANSPOSITION - 2ND STAGE AVF CREATION;  Surgeon: [name], MD;  Location: [place];  Service: Vascular;  Laterality: Right;  . Av fistula placement     Family History  Problem Relation Age of Onset  . Diabetes Mellitus II Father    Social History He continues to smoke 3 cigarettes a day after previously using 1 1/2 packs a day.  He denies any history of alcohol or illicit drug use.   He lives with his wife in [place] and has six grown children.  No Known Allergies Prior to Admission medications   Medication Sig Start Date End Date Taking? Authorizing Provider  acyclovir (ZOVIRAX) 400 MG tablet  Take 400 mg by mouth daily.   Yes Historical Provider, MD  amiodarone (PACERONE) 200 MG tablet Take 200 mg by mouth daily.   Yes Historical Provider, MD  aspirin 81 MG chewable tablet Chew 1 tablet (81 mg total) by mouth daily. 12/10/13  Yes Eugenie Filler, MD  dexamethasone (DECADRON) 4 MG tablet Take 40 mg by mouth every 7 (seven) days. Every Tuesday.   Yes Historical Provider, MD  docusate sodium 100 MG CAPS Take 100 mg by mouth 2 (two) times daily. 12/10/13  Yes Eugenie Filler, MD  ferrous sulfate 325 (65 FE) MG tablet Take 325 mg by mouth daily with breakfast.   Yes Historical Provider, MD  HYDROcodone-acetaminophen (NORCO/VICODIN) 5-325 MG per tablet Take 1 tablet by mouth every 6 (six) hours as needed for moderate pain.    Yes Historical Provider, MD  ipratropium-albuterol  (DUONEB) 0.5-2.5 (3) MG/3ML SOLN Take 3 mLs by nebulization every 4 (four) hours as needed (for shortness of breath). Use 3 times daily for 5 days then stop. 12/10/13  Yes Eugenie Filler, MD  megestrol (MEGACE) 40 MG/ML suspension Take 800 mg by mouth daily.    Yes Historical Provider, MD  midodrine (PROAMATINE) 10 MG tablet Take 1 tablet (10 mg total) by mouth 3 (three) times daily with meals. 09/07/13  Yes Reyne Dumas, MD  multivitamin (RENA-VIT) TABS tablet Take 1 tablet by mouth at bedtime. 12/10/13  Yes Eugenie Filler, MD  pioglitazone (ACTOS) 15 MG tablet Take 15 mg by mouth daily. 12/12/13  Yes Historical Provider, MD  tamsulosin (FLOMAX) 0.4 MG CAPS capsule Take 0.4 mg by mouth daily.   Yes Historical Provider, MD  ACCU-CHEK AVIVA PLUS test strip 1 each by Other route See admin instructions. Check blood sugar once daily. 05/31/13   Historical Provider, MD  ACCU-CHEK SOFTCLIX LANCETS lancets 1 each by Other route See admin instructions. Check blood sugar once daily. 05/31/13   Historical Provider, MD   Labs:  Results for orders placed during the hospital encounter of 12/18/13 (from the past 48 hour(s))  BASIC METABOLIC PANEL     Status: Abnormal   Collection Time    12/18/13 12:42 PM      Result Value Ref Range   Sodium 143  137 - 147 mEq/L   Potassium 4.3  3.7 - 5.3 mEq/L   Chloride 102  96 - 112 mEq/L   CO2 21  19 - 32 mEq/L   Glucose, Bld 99  70 - 99 mg/dL   BUN 31 (*) 6 - 23 mg/dL   Creatinine, Ser 5.11 (*) 0.50 - 1.35 mg/dL   Calcium 8.5  8.4 - 10.5 mg/dL   GFR calc non Af Amer 10 (*) >90 mL/min   GFR calc Af Amer 12 (*) >90 mL/min   Comment: (NOTE)     The eGFR has been calculated using the CKD EPI equation.     This calculation has not been validated in all clinical situations.     eGFR's persistently <90 mL/min signify possible Chronic Kidney     Disease.   Anion gap 20 (*) 5 - 15  CBC     Status: Abnormal   Collection Time    12/18/13 12:42 PM      Result Value Ref  Range   WBC 14.1 (*) 4.0 - 10.5 K/uL   RBC 4.92  4.22 - 5.81 MIL/uL   Hemoglobin 13.7  13.0 - 17.0 g/dL   HCT 43.5  39.0 - 52.0 %  MCV 88.4  78.0 - 100.0 fL   MCH 27.8  26.0 - 34.0 pg   MCHC 31.5  30.0 - 36.0 g/dL   RDW 18.9 (*) 11.5 - 15.5 %   Platelets 145 (*) 150 - 400 K/uL  I-STAT TROPOININ, ED     Status: Abnormal   Collection Time    12/18/13  1:08 PM      Result Value Ref Range   Troponin i, poc 0.11 (*) 0.00 - 0.08 ng/mL   Comment NOTIFIED PHYSICIAN     Comment 3            Comment: Due to the release kinetics of cTnI,     a negative result within the first hours     of the onset of symptoms does not rule out     myocardial infarction with certainty.     If myocardial infarction is still suspected,     repeat the test at appropriate intervals.   Constitutional: positive for fatigue, negative for chills, fevers and sweats Ears, nose, mouth, throat, and face: negative for earaches, hoarseness, nasal congestion and sore throat Respiratory: positive for dyspnea on exertion, negative for cough, hemoptysis and sputum Cardiovascular: positive for dyspnea, negative for chest pain, chest pressure/discomfort, orthopnea and palpitations Gastrointestinal: negative for abdominal pain, change in bowel habits, nausea and vomiting Genitourinary:negative, oliguric Neurological: negative for dizziness, headaches, paresthesia and speech problems  Physical Exam: Filed Vitals:   12/18/13 1500  BP: 115/59  Pulse: 79  Temp:   Resp: 18     General appearance: alert, cooperative and no distress Head: Normocephalic, without obvious abnormality, atraumatic Neck: no adenopathy, no carotid bruit, no JVD and supple, symmetrical, trachea midline Resp: clear to auscultation bilaterally Cardio: regular rate and rhythm, S1, S2 normal, no murmur, click, rub or gallop GI: soft, non-tender; bowel sounds normal; no masses,  no organomegaly Back:  Sacral decubitus covered with dressing Extremities:  1+ edema, atraumatic, no cyanosis Neurologic: Grossly normal Dialysis Access: AVF @ RUA with + bruit   Assessment/Plan: 1. Acute respiratory distress - likely COPD exacerbation, no excess fluid per CXR, still smoking, recent admission for HCAP (completed Vancomycin & Tressie Ellis 10/2); WBCs 14.1, but afebrile. The CXR is not very remarkable with slight LLL changes which are not new, no edema, no large infiltrate 2. ESRD - HD on MWF @ [facility], K 4.3.  HD pending. 3. Hypotension/volume - BP 128/62 on Midodrine 10 mg tid; CXR shows no edema  no wt's done yet, weight after HD 4. Anemia - Hgb 13.7, outpatient Aranesp 120 mcg on Wed. 5. Metabolic bone disease - Last corrected Ca 9.6, P 4.5, iPTH 211; no Hectorol or binders. 6. Nutrition - last Alb 2.2, on outpatient Megace. 7. MM - on Dexamethasone, followed by Dr. Lavera Guise @ [facility]. 8. DM Type 2 - no meds, Hgb A1C 5.6. 9. Sacral decubitus - Stage 4 10. 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 Scott [name] has been exhibiting FTT with downhill course since the diagnosis of myeloma late last year then kidney failure progressing to hemodialysis which was started in June of this year.  He was here in March with HCAP, in June with HCAP / new ESRD / Cdif, in August with severe sepsis / HCAP / stage IV sacral decub /  ESRD and twice in Sept for HCAP /hypoxemia / stage IV decub / ESRD.  I am going to see if I can contact family to discuss his prognosis.  Kelly Splinter MD pager [phone]    cell [phone] 12/18/2013, 6:53 PM

## 2013-12-18 NOTE — ED Provider Notes (Signed)
CSN: [id number]     Arrival date & time 12/18/13  1241 History   First MD Initiated Contact with Patient 12/18/13 1258     Chief Complaint  Patient presents with  . Shortness of Breath     (Consider location/radiation/quality/duration/timing/severity/associated sxs/prior Treatment) HPI Comments: Patient is a 73 year old male with history of hypertension, COPD, pacemaker, diabetes, end-stage renal disease on hemodialysis. He woke up this morning feeling somewhat short of breath. He attempted to go to dialysis, however became extremely short of breath attempting to get into the center. He was then sent here for evaluation. He received an albuterol neb in route. He denies to me he is experiencing any chest pain. He denies any fever, chills, or productive cough. He does state that he was recently admitted for pneumonia.  Patient is a 73 y.o. male presenting with shortness of breath. The history is provided by the patient.  Shortness of Breath Severity:  Moderate Onset quality:  Sudden Duration:  2 hours Timing:  Constant Progression:  Unchanged Chronicity:  Recurrent Relieved by:  Nothing Worsened by:  Nothing tried Ineffective treatments:  None tried   Past Medical History  Diagnosis Date  . Pacemaker   . Shortness of breath     "when I take one of my chemo pills" (03/17/2013)  . Protein calorie malnutrition   . History of blood transfusion X 1    "count was too low"   . Cataract   . Hypertension   . COPD (chronic obstructive pulmonary disease)   . Pneumonia 06/09/2013; 08/2013; 10/2013; 11/2013  . Type II diabetes mellitus     no meds  . Arthritis     "fingers" (12/05/2013)  . ESRD (end stage renal disease) on dialysis     "MWF; [place]" (12/05/2013)  . Multiple myeloma   . Skin cancer of face     "once; left side of my face" (12/05/2013)   Past Surgical History  Procedure Laterality Date  . Appendectomy    . Partial nephrectomy Right 2000's    "cut out ~ 1/2"   . Insert /  replace / remove pacemaker  07/2012  . Bascilic vein transposition Right 07/10/2013    Procedure: BASILIC VEIN TRANSPOSITION- FIRST STAGE ;  Surgeon: Conrad Chevak, MD;  Location: [place];  Service: Vascular;  Laterality: Right;  . Insertion of dialysis catheter Right 08/28/2013    Procedure: INSERTION OF DIALYSIS CATHETER in Right Internal Jugular;  Surgeon: [name], MD;  Location: [place];  Service: Vascular;  Laterality: Right;  . Bascilic vein transposition Right 09/01/2013    Procedure: BASILIC VEIN TRANSPOSITION - 2ND STAGE AVF CREATION;  Surgeon: [name], MD;  Location: [place];  Service: Vascular;  Laterality: Right;  . Av fistula placement     Family History  Problem Relation Age of Onset  . Diabetes Mellitus II Father    History  Substance Use Topics  . Smoking status: Current Some Day Smoker -- 0.25 packs/day for 60 years    Types: Cigarettes  . Smokeless tobacco: Never Used  . Alcohol Use: No    Review of Systems  Respiratory: Positive for shortness of breath.   All other systems reviewed and are negative.     Allergies  Review of patient's allergies indicates no known allergies.  Home Medications   Prior to Admission medications   Medication Sig Start Date End Date Taking? Authorizing Provider  ACCU-CHEK AVIVA PLUS test strip 1 each by Other route See admin  instructions. Check blood sugar once daily. 05/31/13   Historical Provider, MD  ACCU-CHEK SOFTCLIX LANCETS lancets 1 each by Other route See admin instructions. Check blood sugar once daily. 05/31/13   Historical Provider, MD  acyclovir (ZOVIRAX) 400 MG tablet Take 400 mg by mouth daily.    Historical Provider, MD  amiodarone (PACERONE) 200 MG tablet Take 200 mg by mouth daily.    Historical Provider, MD  aspirin 81 MG chewable tablet Chew 1 tablet (81 mg total) by mouth daily. 12/10/13   Eugenie Filler, MD  dexamethasone (DECADRON) 4 MG tablet Take 40 mg by mouth every 7 (seven) days. Every Tuesday.     Historical Provider, MD  docusate sodium 100 MG CAPS Take 100 mg by mouth 2 (two) times daily. 12/10/13   Eugenie Filler, MD  ferrous sulfate 325 (65 FE) MG tablet Take 325 mg by mouth daily with breakfast.    Historical Provider, MD  HYDROcodone-acetaminophen (NORCO/VICODIN) 5-325 MG per tablet Take 1 tablet by mouth every 6 (six) hours as needed for moderate pain.     Historical Provider, MD  ipratropium-albuterol (DUONEB) 0.5-2.5 (3) MG/3ML SOLN Take 3 mLs by nebulization every 4 (four) hours as needed (for shortness of breath). Use 3 times daily for 5 days then stop. 12/10/13   Eugenie Filler, MD  megestrol (MEGACE) 40 MG/ML suspension Take 800 mg by mouth daily.     Historical Provider, MD  midodrine (PROAMATINE) 10 MG tablet Take 1 tablet (10 mg total) by mouth 3 (three) times daily with meals. 09/07/13   Reyne Dumas, MD  multivitamin (RENA-VIT) TABS tablet Take 1 tablet by mouth at bedtime. 12/10/13   Eugenie Filler, MD  tamsulosin (FLOMAX) 0.4 MG CAPS capsule Take 0.4 mg by mouth daily.    Historical Provider, MD   BP 109/46  Pulse 80  Temp(Src) 98.4 F (36.9 C) (Oral)  Resp 27  Ht 5' 5"  (1.651 m)  Wt 148 lb (67.132 kg)  BMI 24.63 kg/m2  SpO2 100% Physical Exam  Nursing note and vitals reviewed. Constitutional: He is oriented to person, place, and time. No distress.  Patient is a 73 year old male who appears chronically ill.  HENT:  Head: Normocephalic and atraumatic.  Mouth/Throat: Oropharynx is clear and moist.  Neck: Normal range of motion. Neck supple.  Cardiovascular: Normal rate, regular rhythm and normal heart sounds.   No murmur heard. Pulmonary/Chest: Effort normal and breath sounds normal. No respiratory distress. He has no wheezes.  Abdominal: Soft. Bowel sounds are normal. He exhibits no distension. There is no tenderness.  Musculoskeletal: Normal range of motion. He exhibits edema.  Bilateral lower extremities have 2-3+ pitting edema.  Lymphadenopathy:     He has no cervical adenopathy.  Neurological: He is alert and oriented to person, place, and time.  Skin: Skin is warm and dry. He is not diaphoretic.    ED Course  Procedures (including critical care time) Labs Review Labs Reviewed  CBC - Abnormal; Notable for the following:    WBC 14.1 (*)    RDW 18.9 (*)    Platelets 145 (*)    All other components within normal limits  I-STAT TROPOININ, ED - Abnormal; Notable for the following:    Troponin i, poc 0.11 (*)    All other components within normal limits  BASIC METABOLIC PANEL    Imaging Review Dg Chest Portable 1 View  12/18/2013   CLINICAL DATA:  Difficulty breathing ; patient on dialysis  EXAM: PORTABLE  CHEST - 1 VIEW  COMPARISON:  Chest radiograph December 09, 2013 and chest CT December 09, 2013  FINDINGS: The previously noted infiltrate in the right base is cleared. There is persistent consolidation in the left base. There is underlying emphysema. No new opacity is appreciable. Heart size and pulmonary vascularity are normal. No adenopathy. Central catheter tip is at the cavoatrial junction without pneumothorax. Pacemaker leads are attached to the right atrium and right ventricle.  IMPRESSION: Underlying emphysema. Persistent infiltrate left base. Recent infiltrate right base is cleared. No new opacity.   Electronically Signed   By: Lowella Grip M.D.   On: 12/18/2013 13:00     EKG Interpretation   Date/Time:  Monday December 18 2013 12:40:01 EDT Ventricular Rate:  85 PR Interval:  62 QRS Duration: 125 QT Interval:  417 QTC Calculation: 496 R Axis:   68 Text Interpretation:  Atrial-paced rhythm Left bundle branch block  Baseline wander in lead(s) I III aVR aVL aVF Unchanged from 12/09/13  Confirmed by DELOS  MD, Kammy Klett ([id number]) on 12/18/2013 1:39:16 PM      MDM   Final diagnoses:  None    Patient with extensive past medical history including end-stage renal disease on hemodialysis, multiple myeloma, pneumonia. He  presents for evaluation of weakness and shortness of breath. He attempted to make his dialysis session today, however was so weak and dyspneic he could not make it into the center. He was then brought here for evaluation.  Workup reveals persistent infiltrate in the left base an elevated white count of 14,000. I suspect his shortness of breath is multifactorial likely related to infection, COPD, and possibly CHF. I've spoken with Dr. Jonnie Finner from nephrology who is in agreement the patient should be admitted to the hospitalist service. Patient will be admitted by Dr. Karleen Hampshire.    Veryl Speak, MD 12/19/13 [phone]

## 2013-12-18 NOTE — Progress Notes (Signed)
ANTIBIOTIC CONSULT NOTE - INITIAL  Pharmacy Consult for Vancomycin and Cefepime Indication: pneumonia (HCAP)  No Known Allergies  Patient Measurements: Height: 5' 5"  (165.1 cm) Weight: 134 lb 7.7 oz (61 kg) IBW/kg (Calculated) : 61.5  Vital Signs: Temp: 98.1 F (36.7 C) (10/05 2125) Temp Source: Oral (10/05 2125) BP: 86/50 mmHg (10/05 2142) Pulse Rate: 121 (10/05 2142)  Labs:  Recent Labs  12/18/13 1242  WBC 14.1*  HGB 13.7  PLT 145*  CREATININE 5.11*   ESRD   Medical History: Past Medical History  Diagnosis Date  . Pacemaker   . Shortness of breath     "when I take one of my chemo pills" (03/17/2013)  . Protein calorie malnutrition   . History of blood transfusion X 1    "count was too low"   . Cataract   . Hypertension   . COPD (chronic obstructive pulmonary disease)   . Pneumonia 06/09/2013; 08/2013; 10/2013; 11/2013  . Type II diabetes mellitus     no meds  . Arthritis     "fingers" (12/05/2013)  . ESRD (end stage renal disease) on dialysis     "MWF; [place]" (12/05/2013)  . Multiple myeloma   . Skin cancer of face     "once; left side of my face" (12/05/2013)   Assessment:   To begin Vancomycin and Cefepime tonight for HCAP coverage.  ESRD, usual MWF.  Dialyzed today.   Was in the hospital 9/22-9/27/15 for HCAP; treated with Vancomycin and Levaquin. Finished 10-day course on 12/15/13 with Vanc + Ceftazidime after dialysis sessions.  Goal of Therapy:  pre-dialysis Vancomycin levels 15-25 mcg/ml Appropriate Cefepime dose for renal function  Plan:    Vancomycin 1250 mg IV x 1 tonight, then 750 mg after HD-MWF.   Cefepime 2 grams IV x 1 then after HD - MWF.   Will follow up culture data and clinical progress.  Arty Baumgartner, [place] Pager: [phone] 12/18/2013,10:56 PM

## 2013-12-18 NOTE — ED Notes (Signed)
Onset today while at dialysis center lobby waiting for dialysis. Has dialysis M/WF.  Stated shortness of breath and EMS was called. Given one treatment of albuterol and one duo neb treatment with relief and wheezing resolved.

## 2013-12-18 NOTE — ED Notes (Signed)
duoneb treatment finished. Pt reports no change in SOB

## 2013-12-18 NOTE — ED Notes (Signed)
EKG completed given to EDP.  

## 2013-12-18 NOTE — H&P (Signed)
[facility] History and Physical  JARRON CURLEY XYV:[id number] DOB: [birth year] DOA: 12/18/2013  Referring physician: EDP PCP: No primary provider on file.   Chief Complaint: SOB   HPI: Scott [name] is a 73 y.o. male with h/o copd, pneumonia recently treated , type 2 DM, protein calorie malnutrition, ESRD on HD, woke up with sob, and hypoxia. His oxygen sats are 87% on 3 lit [place] oxygen on arrival. He was on his way to HD, and was brought by EMS to ED. His CXR revealed persistent infiltrates. He was referred to medical service for admission. His point of care troponin came positive.     Review of Systems:  Could not be obtained.   Past Medical History  Diagnosis Date  . Pacemaker   . Shortness of breath     "when I take one of my chemo pills" (03/17/2013)  . Protein calorie malnutrition   . History of blood transfusion X 1    "count was too low"   . Cataract   . Hypertension   . COPD (chronic obstructive pulmonary disease)   . Pneumonia 06/09/2013; 08/2013; 10/2013; 11/2013  . Type II diabetes mellitus     no meds  . Arthritis     "fingers" (12/05/2013)  . ESRD (end stage renal disease) on dialysis     "MWF; [place]" (12/05/2013)  . Multiple myeloma   . Skin cancer of face     "once; left side of my face" (12/05/2013)   Past Surgical History  Procedure Laterality Date  . Appendectomy    . Partial nephrectomy Right 2000's    "cut out ~ 1/2"   . Insert / replace / remove pacemaker  07/2012  . Bascilic vein transposition Right 07/10/2013    Procedure: BASILIC VEIN TRANSPOSITION- FIRST STAGE ;  Surgeon: Conrad Benton, MD;  Location: [facility];  Service: Vascular;  Laterality: Right;  . Insertion of dialysis catheter Right 08/28/2013    Procedure: INSERTION OF DIALYSIS CATHETER in Right Internal Jugular;  Surgeon: [name], MD;  Location: [place];  Service: Vascular;  Laterality: Right;  . Bascilic vein transposition Right 09/01/2013    Procedure: BASILIC VEIN TRANSPOSITION - 2ND  STAGE AVF CREATION;  Surgeon: [name], MD;  Location: [place];  Service: Vascular;  Laterality: Right;  . Av fistula placement     Social History:  reports that he has been smoking Cigarettes.  He has a 15 pack-year smoking history. He has never used smokeless tobacco. He reports that he does not drink alcohol or use illicit drugs.  No Known Allergies  Family History  Problem Relation Age of Onset  . Diabetes Mellitus II Father      Prior to Admission medications   Medication Sig Start Date End Date Taking? Authorizing Provider  acyclovir (ZOVIRAX) 400 MG tablet Take 400 mg by mouth daily.   Yes Historical Provider, MD  amiodarone (PACERONE) 200 MG tablet Take 200 mg by mouth daily.   Yes Historical Provider, MD  aspirin 81 MG chewable tablet Chew 1 tablet (81 mg total) by mouth daily. 12/10/13  Yes Eugenie Filler, MD  dexamethasone (DECADRON) 4 MG tablet Take 40 mg by mouth every 7 (seven) days. Every Tuesday.   Yes Historical Provider, MD  docusate sodium 100 MG CAPS Take 100 mg by mouth 2 (two) times daily. 12/10/13  Yes Eugenie Filler, MD  ferrous sulfate 325 (65 FE) MG tablet Take 325 mg by mouth daily with breakfast.  Yes Historical Provider, MD  HYDROcodone-acetaminophen (NORCO/VICODIN) 5-325 MG per tablet Take 1 tablet by mouth every 6 (six) hours as needed for moderate pain.    Yes Historical Provider, MD  ipratropium-albuterol (DUONEB) 0.5-2.5 (3) MG/3ML SOLN Take 3 mLs by nebulization every 4 (four) hours as needed (for shortness of breath). Use 3 times daily for 5 days then stop. 12/10/13  Yes Eugenie Filler, MD  megestrol (MEGACE) 40 MG/ML suspension Take 800 mg by mouth daily.    Yes Historical Provider, MD  midodrine (PROAMATINE) 10 MG tablet Take 1 tablet (10 mg total) by mouth 3 (three) times daily with meals. 09/07/13  Yes Reyne Dumas, MD  multivitamin (RENA-VIT) TABS tablet Take 1 tablet by mouth at bedtime. 12/10/13  Yes Eugenie Filler, MD  pioglitazone  (ACTOS) 15 MG tablet Take 15 mg by mouth daily. 12/12/13  Yes Historical Provider, MD  tamsulosin (FLOMAX) 0.4 MG CAPS capsule Take 0.4 mg by mouth daily.   Yes Historical Provider, MD  ACCU-CHEK AVIVA PLUS test strip 1 each by Other route See admin instructions. Check blood sugar once daily. 05/31/13   Historical Provider, MD  ACCU-CHEK SOFTCLIX LANCETS lancets 1 each by Other route See admin instructions. Check blood sugar once daily. 05/31/13   Historical Provider, MD   Physical Exam: Filed Vitals:   12/18/13 1640 12/18/13 1700 12/18/13 1708 12/18/13 1730  BP: 110/58 128/62 126/64 114/67  Pulse: 79 81 79 81  Temp:      TempSrc:  Oral    Resp: 20 25 17    Height:      Weight:      SpO2: 96% 96%      Wt Readings from Last 3 Encounters:  12/18/13 67.132 kg (148 lb)  12/10/13 63.3 kg (139 lb 8.8 oz)  11/26/13 70.4 kg (155 lb 3.3 oz)    General:  Appears calm and comfortable Eyes: PERRL, normal lids, irises & conjunctiva Neck: no LAD, masses or thyromegaly Cardiovascular: RRR, no m/r/g. No LE edema. Respiratory: CTA bilaterally, no wheezing or rhonchi.  Abdomen: soft, ntnd Skin: no rash or induration seen on limited exam Musculoskeletal: right lower leg edema.  Neurologic: grossly non-focal.          Labs on Admission:  Basic Metabolic Panel:  Recent Labs Lab 12/18/13 1242  NA 143  K 4.3  CL 102  CO2 21  GLUCOSE 99  BUN 31*  CREATININE 5.11*  CALCIUM 8.5   Liver Function Tests: No results found for this basename: AST, ALT, ALKPHOS, BILITOT, PROT, ALBUMIN,  in the last 168 hours No results found for this basename: LIPASE, AMYLASE,  in the last 168 hours No results found for this basename: AMMONIA,  in the last 168 hours CBC:  Recent Labs Lab 12/18/13 1242  WBC 14.1*  HGB 13.7  HCT 43.5  MCV 88.4  PLT 145*   Cardiac Enzymes: No results found for this basename: CKTOTAL, CKMB, CKMBINDEX, TROPONINI,  in the last 168 hours  BNP (last 3 results)  Recent  Labs  06/08/13 2145 11/04/13 2328  PROBNP 16467.0* 13759.0*   CBG: No results found for this basename: GLUCAP,  in the last 168 hours  Radiological Exams on Admission: Dg Chest Portable 1 View  12/18/2013   CLINICAL DATA:  Difficulty breathing ; patient on dialysis  EXAM: PORTABLE CHEST - 1 VIEW  COMPARISON:  Chest radiograph December 09, 2013 and chest CT December 09, 2013  FINDINGS: The previously noted infiltrate in the right base  is cleared. There is persistent consolidation in the left base. There is underlying emphysema. No new opacity is appreciable. Heart size and pulmonary vascularity are normal. No adenopathy. Central catheter tip is at the cavoatrial junction without pneumothorax. Pacemaker leads are attached to the right atrium and right ventricle.  IMPRESSION: Underlying emphysema. Persistent infiltrate left base. Recent infiltrate right base is cleared. No new opacity.   Electronically Signed   By: Lowella Grip M.D.   On: 12/18/2013 13:00    EKG: ATRIAL PACED RHYTHM  Assessment/Plan Active Problems:   Dyspnea   Pneumonia   Persistent health care associated pneumonia/ COPD exacerbation: Started him on vancomycin and cefepime.  Blood cultures and sputum cultures.  Keep oxygen sats >90%. He is currently not wheezing, no steroids at this time. Duo nebs every 4 hours.   COPD:  Not wheezing. Resuming duo-nebs.   Hypertension: Controlled.  Resume home medications.    Diabetes mellitus: SSI.    Leukocytosis: Possibly from pneumonia.   ESRD on HD: MWF on HD.   Elevated point of care troponin: he denies chest pain repeatt EKG. AND TROPONINS.    Code Status: full code  DVT Prophylaxis: Family Communication: none at bedside Disposition Plan: admit to telemetry  Time spent: 65 minutes.   [facility] Pager [phone]

## 2013-12-19 ENCOUNTER — Inpatient Hospital Stay (HOSPITAL_COMMUNITY): Payer: Medicare HMO

## 2013-12-19 DIAGNOSIS — I82402 Acute embolism and thrombosis of unspecified deep veins of left lower extremity: Secondary | ICD-10-CM

## 2013-12-19 DIAGNOSIS — J9601 Acute respiratory failure with hypoxia: Secondary | ICD-10-CM

## 2013-12-19 DIAGNOSIS — F329 Major depressive disorder, single episode, unspecified: Secondary | ICD-10-CM

## 2013-12-19 DIAGNOSIS — C9000 Multiple myeloma not having achieved remission: Secondary | ICD-10-CM

## 2013-12-19 DIAGNOSIS — J189 Pneumonia, unspecified organism: Secondary | ICD-10-CM

## 2013-12-19 DIAGNOSIS — Z515 Encounter for palliative care: Secondary | ICD-10-CM

## 2013-12-19 LAB — STREP PNEUMONIAE URINARY ANTIGEN: STREP PNEUMO URINARY ANTIGEN: NEGATIVE

## 2013-12-19 LAB — TROPONIN I
Troponin I: <0.3 ng/mL (ref <0.30)
Troponin I: <0.3 ng/mL (ref <0.30)

## 2013-12-19 LAB — PROTIME-INR
INR: 1.25 (ref 0.00–1.49)
Prothrombin Time: 15.7 seconds — ABNORMAL HIGH (ref 11.6–15.2)

## 2013-12-19 LAB — LEGIONELLA ANTIGEN, URINE

## 2013-12-19 LAB — APTT: aPTT: 37 seconds (ref 24–37)

## 2013-12-19 LAB — PROCALCITONIN: PROCALCITONIN: 0.6 ng/mL

## 2013-12-19 LAB — GLUCOSE, CAPILLARY
GLUCOSE-CAPILLARY: 100 mg/dL — AB (ref 70–99)
GLUCOSE-CAPILLARY: 117 mg/dL — AB (ref 70–99)
GLUCOSE-CAPILLARY: 190 mg/dL — AB (ref 70–99)
Glucose-Capillary: 58 mg/dL — ABNORMAL LOW (ref 70–99)
Glucose-Capillary: 93 mg/dL (ref 70–99)

## 2013-12-19 MED ORDER — ENSURE PUDDING PO PUDG
1.0000 | Freq: Three times a day (TID) | ORAL | Status: DC
  Administered 2013-12-19 – 2013-12-21 (×4): 1 via ORAL

## 2013-12-19 MED ORDER — IPRATROPIUM-ALBUTEROL 0.5-2.5 (3) MG/3ML IN SOLN
3.0000 mL | Freq: Three times a day (TID) | RESPIRATORY_TRACT | Status: DC
Start: 2013-12-19 — End: 2013-12-19
  Administered 2013-12-19: 3 mL via RESPIRATORY_TRACT
  Filled 2013-12-19: qty 3

## 2013-12-19 MED ORDER — WARFARIN - PHARMACIST DOSING INPATIENT: Freq: Every day | Status: DC

## 2013-12-19 MED ORDER — IPRATROPIUM-ALBUTEROL 0.5-2.5 (3) MG/3ML IN SOLN
3.0000 mL | Freq: Four times a day (QID) | RESPIRATORY_TRACT | Status: DC
  Administered 2013-12-19 – 2013-12-21 (×9): 3 mL via RESPIRATORY_TRACT
  Filled 2013-12-19 (×11): qty 3

## 2013-12-19 MED ORDER — MIRTAZAPINE 15 MG PO TABS
15.0000 mg | ORAL_TABLET | Freq: Every day | ORAL | Status: DC
  Administered 2013-12-19: 15 mg via ORAL
  Filled 2013-12-19 (×4): qty 1

## 2013-12-19 MED ORDER — COUMADIN BOOK
Freq: Once | Status: AC
  Administered 2013-12-19: 18:00:00
  Filled 2013-12-19: qty 1

## 2013-12-19 MED ORDER — SODIUM CHLORIDE 0.9 % IV BOLUS (SEPSIS): 250.0000 mL | Freq: Once | INTRAVENOUS | Status: DC

## 2013-12-19 MED ORDER — WARFARIN SODIUM 7.5 MG PO TABS
7.5000 mg | ORAL_TABLET | Freq: Once | ORAL | Status: AC
  Administered 2013-12-19: 7.5 mg via ORAL
  Filled 2013-12-19: qty 1

## 2013-12-19 MED ORDER — TECHNETIUM TC 99M DIETHYLENETRIAME-PENTAACETIC ACID: 40.0000 | Freq: Once | INTRAVENOUS | Status: AC | PRN

## 2013-12-19 MED ORDER — HEPARIN BOLUS VIA INFUSION
4000.0000 [IU] | INTRAVENOUS | Status: AC
  Administered 2013-12-19: 4000 [IU] via INTRAVENOUS
  Filled 2013-12-19: qty 4000

## 2013-12-19 MED ORDER — DEXAMETHASONE 6 MG PO TABS
40.0000 mg | ORAL_TABLET | ORAL | Status: DC
  Administered 2013-12-19: 40 mg via ORAL
  Filled 2013-12-19: qty 1

## 2013-12-19 MED ORDER — HEPARIN (PORCINE) IN NACL 100-0.45 UNIT/ML-% IJ SOLN
800.0000 [IU]/h | INTRAMUSCULAR | Status: DC
  Administered 2013-12-19: 1000 [IU]/h via INTRAVENOUS
  Administered 2013-12-20: 800 [IU]/h via INTRAVENOUS
  Administered 2013-12-20 (×2): 900 [IU]/h via INTRAVENOUS
  Administered 2013-12-21: 800 [IU]/h via INTRAVENOUS
  Filled 2013-12-19 (×4): qty 250

## 2013-12-19 MED ORDER — TECHNETIUM TO 99M ALBUMIN AGGREGATED
6.0000 | Freq: Once | INTRAVENOUS | Status: AC | PRN
  Administered 2013-12-19: 6 via INTRAVENOUS

## 2013-12-19 MED ORDER — WARFARIN VIDEO
Freq: Once | Status: AC
  Administered 2013-12-19: 20:00:00

## 2013-12-19 MED ORDER — PIPERACILLIN-TAZOBACTAM IN DEX 2-0.25 GM/50ML IV SOLN
2.2500 g | Freq: Three times a day (TID) | INTRAVENOUS | Status: DC
Start: 2013-12-19 — End: 2013-12-22
  Administered 2013-12-19 – 2013-12-22 (×10): 2.25 g via INTRAVENOUS
  Filled 2013-12-19 (×14): qty 50

## 2013-12-19 NOTE — Evaluation (Signed)
SLP Cancellation Note  Patient Details Name: Scott [name] MRN: [id number] DOB: 01-Nov-1940   Cancelled treatment:       Reason Eval/Treat Not Completed: Other (comment) (pt requested for his spouse to be present for all medical information/evaluation, declined to participate in swallow evaluation without spouse, spouse is currently out smoking per pt, will re-attempt evaluation at another time)   Luanna Salk, [facility] SLP [phone]

## 2013-12-19 NOTE — Progress Notes (Signed)
UR completed Oza Oberle K. Lachandra Dettmann, RN, BSN, MSHL, CCM  12/19/2013 12:19 PM

## 2013-12-19 NOTE — Consult Note (Addendum)
WOC wound consult note  Reason for Consult: Pt familiar to [facility] team from previous admission; refer to progress notes on 9/23. 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  Wound type: Chronic stage 4  Pressure Ulcer POA: Yes  Measurement :2X.8X.8cm with undermining to 1 cm at 12:00 o'clock  Wound bed: 15% yellow, 85% red, bone palpable with swab  Drainage (amount, consistency, odor) Small amt yellow drainage, no odor  Periwound: Intact skin surrounding  Dressing procedure/placement/frequency: Aquacel to provide antimicrobial benefits and absorb drainage. Foam dressing to protect. Air mattress ordered to reduce pressure.  Discussed pressure ulcer etiology, topical treatment, and preventive measures with patient, wife appears to be well informed and verbalizes understanding.  Please re-consult if further assistance is needed. Thank-you,  Julien Girt MSN, [place], [place], [place], [place]

## 2013-12-19 NOTE — Progress Notes (Addendum)
Hypoglycemic Event  CBG: 63  Treatment: 15 GM carbohydrate snack  Symptoms: Hungry  Follow-up CBG: Time:2302 CBG Result:84  Possible Reasons for Event: Inadequate meal intake  Comments/MD notified:    Ronnette Hila  Remember to initiate Hypoglycemia Order Set & complete

## 2013-12-19 NOTE — Progress Notes (Addendum)
[facility] PROGRESS NOTE Interim History: 73 y.o. male with h/o copd, Multiple Myeloma now on Dialysis since June, recently left the hospital against medical advised on 9-13. He was receiving treatment at that time for PNA. He was still taking Levaquin return on to hosp 9.22.2015 and d/c on 9.27.2015 on vanc and cefepime and completed a 10 day course, type 2 DM, protein calorie malnutrition, ESRD on HD, woke up with sob, and hypoxia. His oxygen sats are 87% on 3 lit [place] oxygen on arrival. He was on his way to HD, and was brought by EMS to ED. His CXR revealed persistent infiltrates   Assessment/Plan: Acute respiratory failure with hypoxia due to HCAP (healthcare-associated pneumonia)/Severe sepsis - Started on vanc and cefepime on 10.5.2015. D/c cefepime start zosyn 10.6.2015 - BC and sputum culture pending. CXR showed persistent LLL infiltrate. - Has remained afebrile, leukocytosis mild improvement. - BP improving and HR stabilizing. 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 - Check SLP rule out aspiration PNA.    Decubitus ulcer of sacral region, stage 4 - wound care.  CKD (chronic kidney disease) stage V requiring chronic dialysis - consult renal for schedule HD.    Multiple myeloma - on decadron.  Protein-calorie malnutrition, severe - consult nutrition - ensure TID   Code Status: Full Code.  DVT Prophylaxis:Heparin.  Family Communication: Wife at bedside,  Disposition Plan: expect 3 to 4 days inpatient.     Consultants:  PCCM  Procedures:  CT angio chest  CXR  Antibiotics:  vanc and cefepime  HPI/Subjective: Feels tired and weak, also still SOB and anorexic.  Objective: Filed Vitals:   12/19/13 0157 12/19/13 0358 12/19/13 0456 12/19/13 0740  BP: 87/42 101/50      Pulse: 83 77    Temp:  97.7 F (36.5 C)    TempSrc:  Oral    Resp: 18 18    Height:      Weight:   60.8 kg (134 lb 0.6 oz)   SpO2: 100% 100%  98%    Intake/Output Summary (Last 24 hours) at 12/19/13 0754 Last data filed at 12/19/13 0612  Gross per 24 hour  Intake    450 ml  Output    976 ml  Net   -526 ml   Filed Weights   12/18/13 1252 12/18/13 2125 12/19/13 0456  Weight: 67.132 kg (148 lb) 61 kg (134 lb 7.7 oz) 60.8 kg (134 lb 0.6 oz)    Exam:  General: Alert, awake, oriented x3,SOB HEENT: No bruits, no goiter.  Heart: Regular rate and rhythm. Lungs: mod air movement, crackles on his left. Abdomen: Soft, nontender, nondistended, positive bowel sounds.     Data Reviewed: Basic Metabolic Panel:  Recent Labs Lab 12/18/13 1242  NA 143  K 4.3  CL 102  CO2 21  GLUCOSE 99  BUN 31*  CREATININE 5.11*  CALCIUM 8.5   Liver Function Tests: No results found for this basename: AST, ALT, ALKPHOS, BILITOT, PROT, ALBUMIN,  in the last 168 hours No results found for this basename: LIPASE, AMYLASE,  in the last 168 hours No results found for this basename: AMMONIA,  in the last 168 hours CBC:  Recent Labs Lab 12/18/13 1242  WBC 14.1*  HGB 13.7  HCT 43.5  MCV 88.4  PLT 145*   Cardiac Enzymes:  Recent Labs Lab 12/18/13 2200 12/18/13 2358 12/19/13 0643  TROPONINI <0.30 <0.30 <0.30   BNP (last 3 results)  Recent Labs  06/08/13 2145 11/04/13 2328  PROBNP 16467.0* 13759.0*   CBG:  Recent Labs Lab 12/18/13 2207 12/18/13 2302 12/19/13 0545 12/19/13 0639  GLUCAP 63* 84 58* 93    No results found for this or any previous visit (from the past 240 hour(s)).   Studies: Dg Chest Portable 1 View  12/18/2013   CLINICAL DATA:  Difficulty breathing ; patient on dialysis  EXAM: PORTABLE CHEST - 1 VIEW  COMPARISON:  Chest radiograph December 09, 2013 and chest CT December 09, 2013  FINDINGS: The previously noted infiltrate in the right base is cleared.  There is persistent consolidation in the left base. There is underlying emphysema. No new opacity is appreciable. Heart size and pulmonary vascularity are normal. No adenopathy. Central catheter tip is at the cavoatrial junction without pneumothorax. Pacemaker leads are attached to the right atrium and right ventricle.  IMPRESSION: Underlying emphysema. Persistent infiltrate left base. Recent infiltrate right base is cleared. No new opacity.   Electronically Signed   By: Lowella Grip M.D.   On: 12/18/2013 13:00    Scheduled Meds: . acyclovir  400 mg Oral Daily  . amiodarone  200 mg Oral Daily  . antiseptic oral rinse  7 mL Mouth Rinse BID  . aspirin  81 mg Oral Daily  . [START ON 12/20/2013] ceFEPime (MAXIPIME) IV  2 g Intravenous Q M,W,F-HD  . enoxaparin (LOVENOX) injection  30 mg Subcutaneous Q24H  . ferrous sulfate  325 mg Oral Q breakfast  . insulin aspart  0-9 Units Subcutaneous TID WC  . ipratropium-albuterol  3 mL Nebulization TID  . megestrol  800 mg Oral Daily  . midodrine  10 mg Oral TID WC  . tamsulosin  0.4 mg Oral Daily  . [START ON 12/20/2013] vancomycin  750 mg Intravenous Q M,W,F-HD   Continuous Infusions:    Charlynne Cousins  [facility] Pager [phone]. If 8PM-8AM, please contact night-coverage at [email or web address], password [facility] 12/19/2013, 7:54 AM  LOS: 1 day

## 2013-12-19 NOTE — Care Management Note (Addendum)
  Page 2 of 2   12/22/2013     3:08:27 PM CARE MANAGEMENT NOTE 12/22/2013  Patient:  Scott [name], Scott [name]   Account Number:  [id number]  Date Initiated:  12/19/2013  Documentation initiated by:  Elizabeth Paulsen  Subjective/Objective Assessment:   dyspnea, PNA     Action/Plan:   CM to follow for disposition needs   Anticipated DC Date:  12/22/2013   Anticipated DC Plan:  [place]  CM consult      [facility] Choice  HOME HEALTH   Choice offered to / List presented to:  C-1 Patient        [place] arranged  HH-1 RN  [place]      [place] agency  [place]  OTHER - SEE NOTE   Status of service:  Completed, signed off Medicare Important Message given?  NO (If response is "NO", the following Medicare IM given date fields will be blank) Date Medicare IM given:  12/21/2013 Medicare IM given by:  Landen Knoedler Date Additional Medicare IM given:   Additional Medicare IM given by:    Discharge Disposition:  [place]  Per UR Regulation:  Reviewed for med. necessity/level of care/duration of stay  If discussed at [place] of Stay Meetings, dates discussed:    Comments:  Andres Bantz RN, BSN, MSHL, CCM  Nurse - Case Manager,  ([facility] [place])  [phone]  12/22/2013 PT RECS:  SNF - patient refused Home with [place] RN, BSN, MSHL, CCM  Nurse - Case Manager,  (Unit [place])  [phone]  12/19/2013 Social:  From home.  Patient near bedbound status and using urinal while at home. HD: HHS:   [place] DME:  Home Oxygen H[facility]y Romans) initiated on 12/10/2013 hospital d/c. PCP:  None Nephrologist:  Estanislado Emms, MD Phone: [phone]; Fax: [phone] PT RECS:  Pending ___________ Dispo Plan:  Initiate HRI (High Risk Initiative Program) d/t readmission x 5 over past 6 months. ([place] / [facility] [phone]  notified)  [facility] [address] [place], [place] [address] Phone: [phone] Fax: [phone]   [facility]. (Registered Nurse and Physical Therapy) [address] [place] [address] [phone] Phone [phone]  Fax

## 2013-12-19 NOTE — Progress Notes (Signed)
BP 99/50  Pulse 77  Temp(Src) 98.4 F (36.9 C) (Oral)  Resp 18  Ht 5\' 5"  (1.651 m)  Wt 60.8 kg (134 lb 0.6 oz)  BMI 22.31 kg/m2  SpO2 100% - Got called by Radiologist, V/Q scan high probability for PE. - start coumadin and heparin per pharmacy.

## 2013-12-19 NOTE — Consult Note (Addendum)
Name: Scott [name] MRN: [id number] DOB: 08-01-1940    ADMISSION DATE:  12/18/2013 CONSULTATION DATE:  12/19/13  REFERRING MD :  Dr. Aileen Fass  CHIEF COMPLAINT:  LLL infiltrate   BRIEF PATIENT DESCRIPTION: 73 y/o M with PMH of MM on decadron and recent admissions for PNA (completed IV abx on 10/2) who presented to [facility] ER with shortness of breath.  Found to have new LLL infiltrate, PCCM consulted for evaluation.    SIGNIFICANT EVENTS  9/13  Admit to [facility] with PNA 9/22  Admit with R PNA, hypoxemia  10/5  Admit with new LLL PNA  STUDIES:  9/26  CT Chest >> patchy densities throughout both lungs, concern for RLL rounded density, basilar densities 10/5  CXR >> persistent L basilar infiltrate, clearing of R  HISTORY OF PRESENT ILLNESS:  73 y/o M, current smoker, with complex medical history as below to include presumed COPD on 2L O2, a diagnosis of Multiple Myeloma in December of 2014 at [facility], currently on Decadron only who presented to [facility] with shortness of breath from dialysis.  Oncologist is Dr. Daphene [name].  He was started on HD in the summer of 2015, AVG placement + perm cath R IJ (~08/2013).  The patient has had multiple recent admissions for recurrent PNA's and C-Diff (08/2013).  Most recent discharge on 9/27 and he completed IV vancomycin & Fortaz on 10/2 (given during HD on M/W/F).  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    Since his diagnosis of MM in Dec 2014 he has had significant weight loss (down 75 lbs), functional decline (he is bed to chair bound) and has been dealing with a non-healing decubitus ulcer.  He was on Velecade up until 08/2013.  In June (26-July 14th) he was admitted to a SNF after a hospitalization for PNA + C-diff but signed out AMA as he felt he was getting poor care.  Currently, the patient is followed by a home health RN and recently has had a debridement of ulcer and VAC placement.  The  VAC was remove due to patient discomfort with sitting through HD.    On 10/5, the patient woke and felt short of breath - to the point he could not eat breakfast.  He rested and this improved.  He then hurried to get to HD on time and by the time he was at HD he was so short of breath that they called EMS for transport.  According to notes, he was wheezing at the time and was given an albuterol tmt with improvement in symptoms.  ER evaluation notable for WBC 14 (was 16 on d/c 9/27), platelets 145 (improved), Na 143, K 4.3, troponin 0.11 (subsequently negative) and cxr that was concerning for LL infiltrate.  Pt denies fevers, chills, n/v/d, chest pain.  PCCM consulted for evaluation.     PAST MEDICAL HISTORY :   has a past medical history of Pacemaker; Shortness of breath; Protein calorie malnutrition; History of blood transfusion (X 1); Cataract; Hypertension; COPD (chronic obstructive pulmonary disease); Pneumonia (06/09/2013; 08/2013; 10/2013; 11/2013); Type II diabetes mellitus; Arthritis; ESRD (end stage renal disease) on dialysis; Multiple myeloma; and Skin cancer of face.  has past surgical history that includes Appendectomy; Partial nephrectomy (Right, 2000's); Insert / replace / remove pacemaker (07/2012); Bascilic vein transposition (Right, 07/10/2013); Insertion of dialysis catheter (Right, 08/28/2013); Bascilic vein transposition (Right, 09/01/2013); and AV fistula placement.  HOME MEDICATIONS  Prior to Admission medications   Medication Sig Start Date End Date Taking? Authorizing Provider  acyclovir (ZOVIRAX) 400 MG tablet Take 400 mg by mouth daily.   Yes Historical Provider, MD  amiodarone (PACERONE) 200 MG tablet Take 200 mg by mouth daily.   Yes Historical Provider, MD  aspirin 81 MG chewable tablet Chew 1 tablet (81 mg total) by mouth daily. 12/10/13  Yes Eugenie Filler, MD  dexamethasone (DECADRON) 4 MG tablet Take 40 mg by mouth every 7 (seven) days. Every Tuesday.   Yes Historical  Provider, MD  ferrous sulfate 325 (65 FE) MG tablet Take 325 mg by mouth daily with breakfast.   Yes Historical Provider, MD  HYDROcodone-acetaminophen (NORCO/VICODIN) 5-325 MG per tablet Take 1 tablet by mouth every 6 (six) hours as needed for moderate pain.    Yes Historical Provider, MD  ipratropium-albuterol (DUONEB) 0.5-2.5 (3) MG/3ML SOLN Take 3 mLs by nebulization every 4 (four) hours as needed (for shortness of breath). Use 3 times daily for 5 days then stop. 12/10/13  Yes Eugenie Filler, MD  megestrol (MEGACE) 40 MG/ML suspension Take 800 mg by mouth daily.    Yes Historical Provider, MD  midodrine (PROAMATINE) 10 MG tablet Take 1 tablet (10 mg total) by mouth 3 (three) times daily with meals. 09/07/13  Yes Reyne Dumas, MD  multivitamin (RENA-VIT) TABS tablet Take 1 tablet by mouth at bedtime. 12/10/13  Yes Eugenie Filler, MD  pioglitazone (ACTOS) 15 MG tablet Take 15 mg by mouth daily. 12/12/13  Yes Historical Provider, MD  tamsulosin (FLOMAX) 0.4 MG CAPS capsule Take 0.4 mg by mouth daily.   Yes Historical Provider, MD  ACCU-CHEK AVIVA PLUS test strip 1 each by Other route See admin instructions. Check blood sugar once daily. 05/31/13   Historical Provider, MD  ACCU-CHEK SOFTCLIX LANCETS lancets 1 each by Other route See admin instructions. Check blood sugar once daily. 05/31/13   Historical Provider, MD  docusate sodium 100 MG CAPS Take 100 mg by mouth 2 (two) times daily. 12/10/13   Eugenie Filler, MD   No Known Allergies  FAMILY HISTORY:  family history includes Diabetes Mellitus II in his father.  SOCIAL HISTORY:  reports that he has been smoking Cigarettes.  He has a 15 pack-year smoking history. He has never used smokeless tobacco. He reports that he does not drink alcohol or use illicit drugs.  REVIEW OF SYSTEMS:   Constitutional: Negative for fever, chills, and diaphoresis. Reports he is always "cold", 75 lb wt loss since 02/2013, weakness/fatigue HENT: Negative for  hearing loss, ear pain, nosebleeds, congestion, sore throat, neck pain, tinnitus and ear discharge.   Eyes: Negative for blurred vision, double vision, photophobia, pain, discharge and redness.  Respiratory: Negative for cough, hemoptysis, sputum production, wheezing and stridor. + SOB.  Minimal cough with white sputum production  Cardiovascular: Negative for chest pain, palpitations, orthopnea, claudication, leg swelling and PND.  Gastrointestinal: Negative for heartburn, nausea, vomiting, abdominal pain, diarrhea, constipation, blood in stool and melena.  Genitourinary: Negative for dysuria, urgency, frequency, hematuria and flank pain.  Musculoskeletal: Negative for myalgias, back pain, joint pain and falls.  Skin: Negative for itching and rash.  Neurological: Negative for dizziness, tingling, tremors, sensory change, speech change, focal weakness, seizures, loss of consciousness, weakness and headaches.  Endo/Heme/Allergies: Negative for environmental allergies and polydipsia. Does not bruise/bleed easily.  SUBJECTIVE:   VITAL SIGNS: Temp:  [97.7 F (36.5 C)-98.6 F (37 C)] 98.4 F (36.9 C) (10/06 1610)  Pulse Rate:  [63-121] 77 (10/06 0358) Resp:  [17-28] 18 (10/06 0358) BP: (80-128)/(42-69) 99/50 mmHg (10/06 0828) SpO2:  [95 %-100 %] 100 % (10/06 0828) Weight:  [134 lb 0.6 oz (60.8 kg)-148 lb (67.132 kg)] 134 lb 0.6 oz (60.8 kg) (10/06 0456)  PHYSICAL EXAMINATION: General:  Chronically ill in NAD  Neuro:  AAOx4,speech clear, MAE HEENT:  Mm pink/moist, no jvd Cardiovascular: s1s2 rrr, no m/r/g Lungs:  resp's even/non-labored on [place] O2, lungs bilaterally diminished  Abdomen:  Round/soft, bsx4 active  Musculoskeletal:  No acute deformities  Skin:  Warm/dry, 2+ pitting LE edema   Recent Labs Lab 12/18/13 1242  NA 143  K 4.3  CL 102  CO2 21  BUN 31*  CREATININE 5.11*  GLUCOSE 99    Recent Labs Lab 12/18/13 1242  HGB 13.7  HCT 43.5  WBC 14.1*  PLT 145*   Dg Chest  Portable 1 View  12/18/2013   CLINICAL DATA:  Difficulty breathing ; patient on dialysis  EXAM: PORTABLE CHEST - 1 VIEW  COMPARISON:  Chest radiograph December 09, 2013 and chest CT December 09, 2013  FINDINGS: The previously noted infiltrate in the right base is cleared. There is persistent consolidation in the left base. There is underlying emphysema. No new opacity is appreciable. Heart size and pulmonary vascularity are normal. No adenopathy. Central catheter tip is at the cavoatrial junction without pneumothorax. Pacemaker leads are attached to the right atrium and right ventricle.  IMPRESSION: Underlying emphysema. Persistent infiltrate left base. Recent infiltrate right base is cleared. No new opacity.   Electronically Signed   By: Lowella Grip M.D.   On: 12/18/2013 13:00    ASSESSMENT / PLAN:  Recurrent PNA  - 73 y/o M with MM, severe deconditioning, and recurrent PNA's.  DDx includes: most concerning for aspiration PNA, opportunist infection, myeloma, or medication side effect with Velecade.  He is edentulous, has FTT and wife has concerns for pts swallowing.     Plan: Assess swallow function with SLP VQ scan will likely be abnormal with infiltrate Continue empiric abx Assess PCT Pulmonary hygiene Trend CXR Assess sputum, though not likely to yield organism given abx Will hold bronch for now as patient would be very high risk   Dyspnea Presumed COPD - emphysema noted on CXR, 60+ yrs smoking (1 1/2 PPD)  Plan: Volume status per HD Change duonebs to Q6, recommend for nebs for d/c as well.  He is not strong enough to inhale Advair etc.   Oxygen to keep sats 90-95%  Multiple Myeloma  Protein Calorie Malnutrition   Plan: Continue Decadron  Obtain records from [place]  Ensure TID  Noe Gens, NP-C [place] [facility] Pgr: [phone] or [phone] 12/19/2013, 9:05 AM    STAFF NOTE: I, Dr Ann Lions have personally reviewed patient's available data, including  medical history, events of note, physical examination and test results as part of my evaluation. I have discussed with resident/NP and other care providers such as pharmacist, RN and RRT.  In addition,  I personally evaluated patient and elicited key findings of  Recurrent pneumonia in a immunocmpromised host. Very tough to narrow Ddx down. I do see similar but milder LLL findngs on CT oct 2014. Also, findings have waxed and waned since then including bilateral diffuse infiltrates. PRimary suspicion based on hx and physical findings is aspiration. Recommend swallow eval. Velcade (proteasome inhibitor) can cause respiratory findings but it seems the timing does not fitt but wife's history is  poor (took velcade early 2015 to summer 2015 but problems developed after). Opportunistic infection and malignancy in lung are possible - but he is high risk for moderate sedation with bronch BAL (he coded after a bone marrow biopsy < 1 year ago). For now advise conservative mgmt,. If needs bronch or biopsy (CT at fu in several months shows persistence), we can set up for invasive procedure with anesthesia. Also with hx of DVT and malignancy worth looking for PE; support primary team initiative on this  Rest per NP/medical resident whose note is outlined above and that I agree with    Dr. Brand Males, M.D., [facility].C.P Pulmonary and Critical Care Medicine Staff Physician [place] [facility] Pager: [phone], If no answer or between  15:00h - 7:00h: call [phone]  12/19/2013 1:44 PM

## 2013-12-19 NOTE — Progress Notes (Signed)
Hypoglycemic Event  CBG: 58  Treatment: 15 GM carbohydrate snack  Symptoms: None  Follow-up CBG: Time:0639 CBG Result:93  Possible Reasons for Event: Inadequate meal intake  Comments/MD notified: Pt given graham crackers and ginger ale, spilled ginger ale on floor and requested mountain dew from wife.     Scott [name] A  Remember to initiate Hypoglycemia Order Set & complete

## 2013-12-19 NOTE — Progress Notes (Signed)
Pt's family called to notify staff that she was not in the room and to make sure pt's bed alarm is on. Nurse tech went into room to turn on bed alarm, but pt refused and stated "it's in by bill of right's to not have the bed alarm on." Reminded pt to call for assistance and not to get OOB on his own. Pt verbalized understanding. Call bell in reach. Will continue to monitor pt closely.  Eulis Canner, RN

## 2013-12-19 NOTE — Consult Note (Signed)
Patient Scott [name] [name]      DOB: [birth year]      XKG:[id number]     Consult Note from the Palliative Medicine Team at [facility] Requested by: Dr Scott [name] [name]    PCP: No primary provider on file. Reason for Consultation:Goals of Care    Phone Number:None  Assessment/Recommendations: 73 yo male with multiple medical problems including multiple myeloma, COPD, CAD, malnutrition, PNA, frequent hospitalizations who presented with dyspnea and found to have high probability PE on VQ scan.    1.  Code Status: DNR after my discussion today, I have placed order  2. Goals of Care: Difficult situation with multiple admissions for declining health. Follows with Oncologist in [employer] for his multiple myeloma and he actually discussed hospice and stopping treatment with them before.  Unfortunately, Scott [name] had a bad experience with hospice caring for his mother and has some mistrust of this organization which is also shared by his wife. The other difficulty is his ESRD and dialysis needs.  Most hospice agencies will not provide dialysis and hospice care (though sometimes this can rarely be worked around).  Would not explore because he would not be accepting of hospice care at this point.  One thing is clear is that he will never go to nursing home.  He signed out of nursing home [facility] in past and will not go back. In framing this desire around code status and advance care planning, he is clear after our conversation that he would not want to go on ventilator support or have CPR. We talked about lower probability of surviving these events but also difficulties in getting back home.  His wife was also present for this conversation and agrees that this fits with their previous discussions as well.  Future treatment decisions should be framed with this in mind as well.  Procedures/treatments that make nursing home care high probability are not in line with his wishes.    3. Symptom  Management:   1. Severe Protein Calorie Maln/Appetite Loss- I have stopped megace as this is prothrombotic and now with PE. Will replace with remeron. Steroids also likely helping 2. Sacral Wound Pain- continue PRN hydrocodone 3. Depression- remeron for appetite as above may help here as well.   4. Psychosocial/Spiritual: married to wife Scott [name] [name] for >30 years. They have 2 children together who live out of state. One adopted child as well.  Scott [name] [name] was orphaned as a child but reconnected with his mom when he was 74. She reportedly also had MM.  Has more agnostic spiritual view, holds all religions in equal regard.  Struggles with declining independence and functional state.    Brief HPI: 73 yo male with PMHx Afib, COPD, multiple myeloma, ESRD on HD, malnutrition, who presented to ED on 10/5 with dyspnea and found to have CXR infiltrates concerning for PNA.  Has had multiple admissions here and in [facility] over past few months. 2 admissions here for PNA and resp failure last month. Has had significant weight loss during this time as well.  Declining functional status and only getting up to chair for short periods of time 4-5x's/day.  Developed sacral decubitius ulcer and currently receiving home health in [facility] for appx the past 3 months. Had wound vac, but this was discontinued as pain was too great. Now pain less but occasionally using PRN hydrocodone at home. Was d/c'd to a nursing home several months ago but signed out AMA from there because he disliked  it so much.  He is adamant that he would not return to a nursing home situation again.  Follows with Oncologist (Dr Scott [name] [name]?) in [facility]. His wife told me that last time they were there, he talked about hospice as option and no longer pursuing treatment for myeloma.  They decided they didn't want to "give-up".   Detailed discussion as noted above today. Admits to feeling down because of his illness. This has been going on for multiple  months.  Breathing feeling much better since admission. No pain today but occasional from sacral wound. Mostly this is when sitting up. Controlled at home with PRN hydrocodone which he seems to take about 1-2 per day.  Appetite remains poor.  Not having F/C, CP, N/V, constipation, diarrhea.     ROS: Full ROS negative unless otherwise mentioned above    PMH:  Past Medical History  Diagnosis Date  . Pacemaker   . Shortness of breath     "when I take one of my chemo pills" (03/17/2013)  . Protein calorie malnutrition   . History of blood transfusion X 1    "count was too low"   . Cataract   . Hypertension   . COPD (chronic obstructive pulmonary disease)   . Pneumonia 06/09/2013; 08/2013; 10/2013; 11/2013  . Type II diabetes mellitus     no meds  . Arthritis     "fingers" (12/05/2013)  . ESRD (end stage renal disease) on dialysis     "MWF; [place]" (12/05/2013)  . Multiple myeloma   . Skin cancer of face     "once; left side of my face" (12/05/2013)     PSH: Past Surgical History  Procedure Laterality Date  . Appendectomy    . Partial nephrectomy Right 2000's    "cut out ~ 1/2"   . Insert / replace / remove pacemaker  07/2012  . Bascilic vein transposition Right 07/10/2013    Procedure: BASILIC VEIN TRANSPOSITION- FIRST STAGE ;  Surgeon: Conrad Bearden, MD;  Location: [place];  Service: Vascular;  Laterality: Right;  . Insertion of dialysis catheter Right 08/28/2013    Procedure: INSERTION OF DIALYSIS CATHETER in Right Internal Jugular;  Surgeon: [name], MD;  Location: [place];  Service: Vascular;  Laterality: Right;  . Bascilic vein transposition Right 09/01/2013    Procedure: BASILIC VEIN TRANSPOSITION - 2ND STAGE AVF CREATION;  Surgeon: [name], MD;  Location: [place];  Service: Vascular;  Laterality: Right;  . Av fistula placement     I have reviewed the FH and SH and  If appropriate update it with new information. No Known Allergies Scheduled Meds: . acyclovir  400 mg Oral  Daily  . amiodarone  200 mg Oral Daily  . antiseptic oral rinse  7 mL Mouth Rinse BID  . aspirin  81 mg Oral Daily  . dexamethasone  40 mg Oral Q7 days  . feeding supplement (ENSURE)  1 Container Oral TID BM  . ferrous sulfate  325 mg Oral Q breakfast  . insulin aspart  0-9 Units Subcutaneous TID WC  . ipratropium-albuterol  3 mL Nebulization Q6H  . midodrine  10 mg Oral TID WC  . mirtazapine  15 mg Oral QHS  . piperacillin-tazobactam (ZOSYN)  IV  2.25 g Intravenous 3 times per day  . tamsulosin  0.4 mg Oral Daily  . [START ON 12/20/2013] vancomycin  750 mg Intravenous Q M,W,F-HD   Continuous Infusions:  PRN Meds:.HYDROcodone-acetaminophen, technetium TC 86M diethylenetriame-pentaacetic  acid    BP 95/46  Pulse 84  Temp(Src) 97.9 F (36.6 C) (Oral)  Resp 18  Ht 5' 5"  (1.651 m)  Wt 60.8 kg (134 lb 0.6 oz)  BMI 22.31 kg/m2  SpO2 100%   PPS: 40   Intake/Output Summary (Last 24 hours) at 12/19/13 1553 Last data filed at 12/19/13 1437  Gross per 24 hour  Intake    910 ml  Output   1026 ml  Net   -116 ml    Physical Exam: General: Alert, NAD HEENT:  [place], sclera anicteric, dry mm Neck: supple Chest:   CTAB  CVS:RRR Abdomen:soft, NT Ext: no c/c/e Skin: warm/dry Psych: tearful at times, flat affect Neuro: moves all 4 ext MSK: sacral wound   Labs: CBC    Component Value Date/Time   WBC 14.1* 12/18/2013 1242   RBC 4.92 12/18/2013 1242   RBC 3.06* 08/24/2013 1040   HGB 13.7 12/18/2013 1242   HCT 43.5 12/18/2013 1242   PLT 145* 12/18/2013 1242   MCV 88.4 12/18/2013 1242   MCH 27.8 12/18/2013 1242   MCHC 31.5 12/18/2013 1242   RDW 18.9* 12/18/2013 1242   LYMPHSABS 0.5* 11/26/2013 0440   MONOABS 0.5 11/26/2013 0440   EOSABS 0.0 11/26/2013 0440   BASOSABS 0.0 11/26/2013 0440    BMET    Component Value Date/Time   NA 143 12/18/2013 1242   K 4.3 12/18/2013 1242   CL 102 12/18/2013 1242   CO2 21 12/18/2013 1242   GLUCOSE 99 12/18/2013 1242   BUN 31* 12/18/2013 1242    CREATININE 5.11* 12/18/2013 1242   CALCIUM 8.5 12/18/2013 1242   GFRNONAA 10* 12/18/2013 1242   GFRAA 12* 12/18/2013 1242    CMP     Component Value Date/Time   NA 143 12/18/2013 1242   K 4.3 12/18/2013 1242   CL 102 12/18/2013 1242   CO2 21 12/18/2013 1242   GLUCOSE 99 12/18/2013 1242   BUN 31* 12/18/2013 1242   CREATININE 5.11* 12/18/2013 1242   CALCIUM 8.5 12/18/2013 1242   PROT 5.0* 12/07/2013 0628   ALBUMIN 2.0* 12/07/2013 0628   AST 18 12/07/2013 0628   ALT 12 12/07/2013 0628   ALKPHOS 76 12/07/2013 0628   BILITOT 0.7 12/07/2013 0628   GFRNONAA 10* 12/18/2013 1242   GFRAA 12* 12/18/2013 1242   10/5 CXR IMPRESSION:  Underlying emphysema. Persistent infiltrate left base. Recent  infiltrate right base is cleared. No new opacity.   10/6 VQ scan IMPRESSION:  Essentially absent perfusion throughout most of the left upper lobe,  a new finding with significant ventilation/perfusion mismatch in the  left upper lobe. This finding constitutes a high probability of  pulmonary embolus.    Time In: 215 Time Out 345 Total Time: 90 minutes  Greater than 50%  of this time was spent counseling and coordinating care related to the above assessment and plan.  Doran Clay D.O. Palliative Medicine Team at [facility]  Pager: [phone] Team Phone: [phone]

## 2013-12-19 NOTE — Progress Notes (Addendum)
Pt A-paced in ED per MD note and EKG. Pt Afib on monitor 100-120 when admitted to floor. Pt now in [place] per monitor in 50s. MD on call K Schorr notified. New order for EKG, EKG performed, but pt unable to turn onto back or stay still. BP obtained 87/42. HR in 80s. 01:51AM  K Schorr notified, new order for NS bolus 250cc, pt currently getting 250 cc with vancomycin dose. K Schorr ordered for RN to notify if MAP greater than 55 after vanc dose before giving NS bolus. 03:09  Follow up BP after Vanc dose 101/50, MAP of 67. Scott Blinks, NP notified Scott Hila, RN

## 2013-12-19 NOTE — Progress Notes (Signed)
Chart review complete.  Patient is not eligible for [facility] Care Management services because his/her PCP is not a [facility] primary care provider or is not [facility] affiliated.  For any additional questions or new referrals please contact Tim Henderson BSN RN [facility] Liaison at [phone] °

## 2013-12-19 NOTE — Progress Notes (Signed)
Subjective:  Tired, but breathing well, comfortable in bed  Objective: Vital signs in last 24 hours: Temp:  [97.7 F (36.5 C)-98.6 F (37 C)] 98.4 F (36.9 C) (10/06 0828) Pulse Rate:  [63-121] 77 (10/06 0358) Resp:  [17-28] 18 (10/06 0358) BP: (80-128)/(42-69) 99/50 mmHg (10/06 0828) SpO2:  [95 %-100 %] 100 % (10/06 0828) Weight:  [60.8 kg (134 lb 0.6 oz)-67.132 kg (148 lb)] 60.8 kg (134 lb 0.6 oz) (10/06 0456) Weight change:   Intake/Output from previous day: 10/05 0701 - 10/06 0700 In: 450 [P.O.:200; IV Piggyback:250] Out: 976 [Urine:50] Intake/Output this shift: Total I/O In: 120 [P.O.:120] Out: 50 [Urine:50]  Lab Results:  Recent Labs  12/18/13 1242  WBC 14.1*  HGB 13.7  HCT 43.5  PLT 145*   BMET:  Recent Labs  12/18/13 1242  NA 143  K 4.3  CL 102  CO2 21  GLUCOSE 99  BUN 31*  CREATININE 5.11*  CALCIUM 8.5   No results found for this basename: PTH,  in the last 72 hours Iron Studies: No results found for this basename: IRON, TIBC, TRANSFERRIN, FERRITIN,  in the last 72 hours  Studies/Results: Dg Chest Portable 1 View  12/18/2013   CLINICAL DATA:  Difficulty breathing ; patient on dialysis  EXAM: PORTABLE CHEST - 1 VIEW  COMPARISON:  Chest radiograph December 09, 2013 and chest CT December 09, 2013  FINDINGS: The previously noted infiltrate in the right base is cleared. There is persistent consolidation in the left base. There is underlying emphysema. No new opacity is appreciable. Heart size and pulmonary vascularity are normal. No adenopathy. Central catheter tip is at the cavoatrial junction without pneumothorax. Pacemaker leads are attached to the right atrium and right ventricle.  IMPRESSION: Underlying emphysema. Persistent infiltrate left base. Recent infiltrate right base is cleared. No new opacity.   Electronically Signed   By: Lowella Grip M.D.   On: 12/18/2013 13:00   EXAM: General appearance:  Alert, in no apparent distress Resp: CTA  without rales, rhonchi, or wheezes Cardio:  RRR without murmur or rub GI:  + BS, soft and nontender Extremities:  Trace edema Access:  AVF @ RUA with + bruit  Dialysis Orders: MWF @ [facility]  4 hrs 63 kg 400/A1.5 2K/2.25Ca Heparin 2000 U R IJ catheter, R BVT (2nd stage 6/19)  No Hectorol Aranesp 120 mcg on Wed No Venofer  Assessment/Plan:  1. Acute respiratory distress - likely COPD exacerbation, no excess fluid per CXR, still smoking, recent admission for HCAP (completed Vancomycin & Tressie Ellis 10/2); WBCs 14.1, but afebrile. 2. ESRD - HD on MWF @ [facility], K 4.3. HD tomorrow. 3. Hypotension/volume - BP 99/50 on Midodrine 10 mg tid; CXR shows no edema, wt 60.8 kg, below EDW.  4. Anemia - Hgb 13.7, outpatient Aranesp 120 mcg on Wed. 5. Metabolic bone disease - Last corrected Ca 9.6, P 4.5, iPTH 211; no Hectorol or binders. 6. Nutrition - last Alb 2.2, on outpatient Megace. 7. MM - on Dexamethasone, followed by Dr. Lavera Guise @ [facility]. 8. DM Type 2 - no meds, Hgb A1C 5.6. 9. Sacral decubitus - Stage 4, wound care. 10. Chronic DVT - @ L proximal gastrocnemius vein of L LE per Doppler 9/24   LOS: 1 day   Scott [name],Scott [name] 12/19/2013,12:33 PM  Pt seen, examined and agree w A/P as above. VQ high prob for PE, started on heparin and coumadin. Had long discussion with pt's wife yesterday, she said the Oncologist at their  last two visits told her that Mr Mckillop has "4-6 months" left, and that he was doing poorly . She understands and is struggling with the reality. She says her husband knows that things aren't going well, and has been angry a lot lately with her and confused off and on. I suggested palliative care consult to work w family at minimum, and with the patient if he will engage them. Consulted palliative care.  Kelly Splinter MD pager [phone]    cell [phone] 12/19/2013, 2:36 PM

## 2013-12-19 NOTE — Progress Notes (Addendum)
ANTICOAGULATION CONSULT NOTE - Initial Consult  Pharmacy Consult for Heparin and coumadin Indication: pulmonary embolus  No Known Allergies  Patient Measurements: Height: 5' 5"  (165.1 cm) Weight: 134 lb 0.6 oz (60.8 kg) IBW/kg (Calculated) : 61.5 Heparin Dosing Weight: 60.8 kg  Vital Signs: Temp: 97.9 F (36.6 C) (10/06 1437) Temp Source: Oral (10/06 1437) BP: 95/46 mmHg (10/06 1437) Pulse Rate: 84 (10/06 1437)  Labs:  Recent Labs  12/18/13 1242 12/18/13 2200 12/18/13 2358 12/19/13 0643  HGB 13.7  --   --   --   HCT 43.5  --   --   --   PLT 145*  --   --   --   CREATININE 5.11*  --   --   --   TROPONINI  --  <0.30 <0.30 <0.30    Estimated Creatinine Clearance: 11.4 ml/min (by C-G formula based on Cr of 5.11).   Medical History: Past Medical History  Diagnosis Date  . Pacemaker   . Shortness of breath     "when I take one of my chemo pills" (03/17/2013)  . Protein calorie malnutrition   . History of blood transfusion X 1    "count was too low"   . Cataract   . Hypertension   . COPD (chronic obstructive pulmonary disease)   . Pneumonia 06/09/2013; 08/2013; 10/2013; 11/2013  . Type II diabetes mellitus     no meds  . Arthritis     "fingers" (12/05/2013)  . ESRD (end stage renal disease) on dialysis     "MWF; [place]" (12/05/2013)  . Multiple myeloma   . Skin cancer of face     "once; left side of my face" (12/05/2013)    Medications:  Prescriptions prior to admission  Medication Sig Dispense Refill  . acyclovir (ZOVIRAX) 400 MG tablet Take 400 mg by mouth daily.      Marland Kitchen amiodarone (PACERONE) 200 MG tablet Take 200 mg by mouth daily.      Marland Kitchen aspirin 81 MG chewable tablet Chew 1 tablet (81 mg total) by mouth daily.      Marland Kitchen dexamethasone (DECADRON) 4 MG tablet Take 40 mg by mouth every 7 (seven) days. Every Tuesday.      . ferrous sulfate 325 (65 FE) MG tablet Take 325 mg by mouth daily with breakfast.      . HYDROcodone-acetaminophen (NORCO/VICODIN) 5-325 MG per  tablet Take 1 tablet by mouth every 6 (six) hours as needed for moderate pain.       Marland Kitchen ipratropium-albuterol (DUONEB) 0.5-2.5 (3) MG/3ML SOLN Take 3 mLs by nebulization every 4 (four) hours as needed (for shortness of breath). Use 3 times daily for 5 days then stop.  360 mL  0  . megestrol (MEGACE) 40 MG/ML suspension Take 800 mg by mouth daily.       . midodrine (PROAMATINE) 10 MG tablet Take 1 tablet (10 mg total) by mouth 3 (three) times daily with meals.  90 tablet  0  . multivitamin (RENA-VIT) TABS tablet Take 1 tablet by mouth at bedtime.    0  . pioglitazone (ACTOS) 15 MG tablet Take 15 mg by mouth daily.      . tamsulosin (FLOMAX) 0.4 MG CAPS capsule Take 0.4 mg by mouth daily.      Marland Kitchen ACCU-CHEK AVIVA PLUS test strip 1 each by Other route See admin instructions. Check blood sugar once daily.      Marland Kitchen ACCU-CHEK SOFTCLIX LANCETS lancets 1 each by Other route See admin  instructions. Check blood sugar once daily.      Marland Kitchen docusate sodium 100 MG CAPS Take 100 mg by mouth 2 (two) times daily.  10 capsule  0   Scheduled:  . acyclovir  400 mg Oral Daily  . amiodarone  200 mg Oral Daily  . antiseptic oral rinse  7 mL Mouth Rinse BID  . aspirin  81 mg Oral Daily  . dexamethasone  40 mg Oral Q7 days  . feeding supplement (ENSURE)  1 Container Oral TID BM  . ferrous sulfate  325 mg Oral Q breakfast  . insulin aspart  0-9 Units Subcutaneous TID WC  . ipratropium-albuterol  3 mL Nebulization Q6H  . midodrine  10 mg Oral TID WC  . mirtazapine  15 mg Oral QHS  . piperacillin-tazobactam (ZOSYN)  IV  2.25 g Intravenous 3 times per day  . tamsulosin  0.4 mg Oral Daily  . [START ON 12/20/2013] vancomycin  750 mg Intravenous Q M,W,F-HD    Assessment: 73 y.o male with h/o ESRD -HD on MWF, COPD and recent PNA,  admitted with acute respiratory distress.  Now VQ scan showing high probability for PE and pharmacy consulted to start patient on IV heparin and coumadin.  Lovenox 30 mg SQ given last night 10/5  ~23:-00, now discontinued.  Baseline PTT, PT/INR pending. H/H on admit 10/5 are within normal limits,  PLTC is low at 145K.  No bleeding noted.  Coumadin predictor point score = 4 points.  Goal of Therapy:  INR 2-3 Heparin level 0.3-0.7 units/ml Monitor platelets by anticoagulation protocol: Yes   Plan:  Heparin 4000 units IV bolus now Heparin drip 1000 units/hr  Check heparin level 8 hours after bolus Daily heparin level, INR and CBC.  Give coumadin 7.5 mg today x1  Nicole Cella, RPh Clinical Pharmacist Pager: [phone] 12/19/2013,3:44 PM

## 2013-12-19 NOTE — Progress Notes (Signed)
PT Cancellation Note  Patient Details Name: Scott [name] MRN: [id number] DOB: July 07, 1940   Cancelled Treatment:    Reason Eval/Treat Not Completed: Patient not medically ready (VQ scan high probability  for PE recently reported.)   Claretha Cooper 12/19/2013, 2:41 PM Tresa Endo PT [phone]

## 2013-12-19 NOTE — Progress Notes (Signed)
Pt HR up to 140s when OOB to BSC to have BM, back to 80s once resting in bed.

## 2013-12-20 DIAGNOSIS — L89154 Pressure ulcer of sacral region, stage 4: Secondary | ICD-10-CM

## 2013-12-20 DIAGNOSIS — A419 Sepsis, unspecified organism: Secondary | ICD-10-CM

## 2013-12-20 DIAGNOSIS — R6520 Severe sepsis without septic shock: Principal | ICD-10-CM

## 2013-12-20 DIAGNOSIS — E43 Unspecified severe protein-calorie malnutrition: Secondary | ICD-10-CM

## 2013-12-20 LAB — RENAL FUNCTION PANEL
ALBUMIN: 1.7 g/dL — AB (ref 3.5–5.2)
Anion gap: 20 — ABNORMAL HIGH (ref 5–15)
BUN: 22 mg/dL (ref 6–23)
CHLORIDE: 99 meq/L (ref 96–112)
CO2: 20 mEq/L (ref 19–32)
CREATININE: 4.02 mg/dL — AB (ref 0.50–1.35)
Calcium: 7.7 mg/dL — ABNORMAL LOW (ref 8.4–10.5)
GFR calc non Af Amer: 14 mL/min — ABNORMAL LOW (ref >90)
GFR, EST AFRICAN AMERICAN: 16 mL/min — AB (ref >90)
Glucose, Bld: 207 mg/dL — ABNORMAL HIGH (ref 70–99)
Phosphorus: 5 mg/dL — ABNORMAL HIGH (ref 2.3–4.6)
Potassium: 4.6 mEq/L (ref 3.7–5.3)
SODIUM: 139 meq/L (ref 137–147)

## 2013-12-20 LAB — CBC
HCT: 32.6 % — ABNORMAL LOW (ref 39.0–52.0)
HEMOGLOBIN: 10.4 g/dL — AB (ref 13.0–17.0)
MCH: 28.1 pg (ref 26.0–34.0)
MCHC: 31.9 g/dL (ref 30.0–36.0)
MCV: 88.1 fL (ref 78.0–100.0)
Platelets: 138 10*3/uL — ABNORMAL LOW (ref 150–400)
RBC: 3.7 MIL/uL — AB (ref 4.22–5.81)
RDW: 18.8 % — ABNORMAL HIGH (ref 11.5–15.5)
WBC: 7.4 10*3/uL (ref 4.0–10.5)

## 2013-12-20 LAB — PROTIME-INR
INR: 1.4 (ref 0.00–1.49)
PROTHROMBIN TIME: 17.2 s — AB (ref 11.6–15.2)

## 2013-12-20 LAB — HEPARIN LEVEL (UNFRACTIONATED)
Heparin Unfractionated: 0.47 IU/mL (ref 0.30–0.70)
Heparin Unfractionated: 0.98 IU/mL — ABNORMAL HIGH (ref 0.30–0.70)
Heparin Unfractionated: 1.09 IU/mL — ABNORMAL HIGH (ref 0.30–0.70)

## 2013-12-20 LAB — GLUCOSE, CAPILLARY
Glucose-Capillary: 117 mg/dL — ABNORMAL HIGH (ref 70–99)
Glucose-Capillary: 157 mg/dL — ABNORMAL HIGH (ref 70–99)
Glucose-Capillary: 131 mg/dL — ABNORMAL HIGH (ref 70–99)
Glucose-Capillary: 182 mg/dL — ABNORMAL HIGH (ref 70–99)

## 2013-12-20 MED ORDER — WARFARIN SODIUM 5 MG PO TABS
5.0000 mg | ORAL_TABLET | Freq: Once | ORAL | Status: AC
Start: 2013-12-20 — End: 2013-12-20
  Administered 2013-12-20: 5 mg via ORAL
  Filled 2013-12-20: qty 1

## 2013-12-20 MED ORDER — MIDODRINE HCL 5 MG PO TABS
ORAL_TABLET | ORAL | Status: AC
  Filled 2013-12-20: qty 2

## 2013-12-20 NOTE — Progress Notes (Signed)
SLP Cancellation Note  Patient Details Name: Scott [name] MRN: [id number] DOB: 1940-03-17   Cancelled treatment:       Reason Eval/Treat Not Completed: Other (comment);Fatigue/lethargy limiting ability to participate (Wife not present at this time, pt profoundly weak/lethargic, leaving for HD soon. Will recheck next date.) Given recurrent PNA, history of COPD, and no overt s/s aspiration, objective study may be beneficial to evaluate swallow function and safety. Pt unable to participate at this time. SLP to continue efforts, hopefully next visit wife will be present. RN aware. Pearlean Sabina B. Quentin Ore [facility], CCC-SLP [phone]  Shonna Chock 12/20/2013, 10:22 AM

## 2013-12-20 NOTE — Progress Notes (Signed)
PT Cancellation Note  Patient Details Name: Scott [name] MRN: [id number] DOB: 04/09/1940   Cancelled Treatment:    Reason Eval/Treat Not Completed: Patient at procedure or test/unavailable. Pt to HD for the afternoon. Will continue to follow and eval tomorrow when able.    Rolinda Roan 12/20/2013, 1:13 PM  Rolinda Roan, PT, DPT Acute Rehabilitation Services Pager: [phone]

## 2013-12-20 NOTE — Progress Notes (Signed)
[facility] PROGRESS NOTE Interim History: 73 y.o. male with h/o copd, Multiple Myeloma now on Dialysis since June, recently left the hospital against medical advised on 9-13. He was receiving treatment at that time for PNA. He was still taking Levaquin return on to hosp 9.22.2015 and d/c on 9.27.2015 on vanc and cefepime and completed a 10 day course, type 2 DM, protein calorie malnutrition, ESRD on HD, woke up with sob, and hypoxia. His oxygen sats are 87% on 3 lit [place] oxygen on arrival. He was on his way to HD, and was brought by EMS to ED. His CXR revealed persistent infiltrates   Assessment/Plan: Acute respiratory failure with hypoxia due to HCAP (healthcare-associated pneumonia)/Severe sepsis -continue vanc and zosyn for now -no further fever and WBC's WNL now -BC and sputum culture pending. CXR showed persistent LLL infiltrate. -BP improving and HR stabilizing. -V/q scan to rule out PE with results of high probability; continue coumadin and heparin per pharmacy  DVT/PE -as mentioned above will treat with heparin and coumadin.    Decubitus ulcer of sacral region, stage 4 -follow wound care rec's.  CKD (chronic kidney disease) stage V requiring chronic dialysis -renal on board -will follow rec's -continue HD    Multiple myeloma -continue on decadron.  Protein-calorie malnutrition, severe - follow nutrition service rec's - continue feeding supplements -Appetite still poor   Code Status: DNR  DVT Prophylaxis:Heparin.  Family Communication: no family at bedside Disposition Plan: to be determine    Consultants:  PCCM  Procedures:  V/Q scan: high probability   LE duplex: positive for DVT  Antibiotics:  vanc and cefepime  HPI/Subjective: Feels tired and weak, reports breathing is better.  Objective: Filed Vitals:   12/20/13 1630 12/20/13 1650 12/20/13 2017 12/20/13 2103  BP: 120/60 132/74  107/55  Pulse: 86 86 81 74  Temp:  97.4 F (36.3 C)  98  F (36.7 C)  TempSrc:  Oral  Oral  Resp: 18 18 16 18   Height:      Weight:  57.1 kg (125 lb 14.1 oz)    SpO2:  98% 98% 99%    Intake/Output Summary (Last 24 hours) at 12/20/13 2237 Last data filed at 12/20/13 1829  Gross per 24 hour  Intake    290 ml  Output   1151 ml  Net   -861 ml   Filed Weights   12/20/13 0426 12/20/13 1250 12/20/13 1650  Weight: 58.4 kg (128 lb 12 oz) 58.5 kg (128 lb 15.5 oz) 57.1 kg (125 lb 14.1 oz)    Exam:  General: Alert, awake, oriented x3, reports breathing is better HEENT: No bruits, no goiter. No JVD Heart: Regular rate and rhythm. No rubs or gallops Lungs: fair air movement, scattered rhonchi; no Wetzel crackles Abdomen: Soft, nontender, nondistended, positive bowel sounds.  Skin: sacrum decubitus ulcer stage 4; no purulent discharge    Data Reviewed: Basic Metabolic Panel:  Recent Labs Lab 12/18/13 1242 12/20/13 0008  NA 143 139  K 4.3 4.6  CL 102 99  CO2 21 20  GLUCOSE 99 207*  BUN 31* 22  CREATININE 5.11* 4.02*  CALCIUM 8.5 7.7*  PHOS  --  5.0*   Liver Function Tests:  Recent Labs Lab 12/20/13 0008  ALBUMIN 1.7*   CBC:  Recent Labs Lab 12/18/13 1242 12/20/13 0008  WBC 14.1* 7.4  HGB 13.7 10.4*  HCT 43.5 32.6*  MCV 88.4 88.1  PLT 145* 138*   Cardiac Enzymes:  Recent Labs Lab  12/18/13 2200 12/18/13 2358 12/19/13 0643  TROPONINI <0.30 <0.30 <0.30   BNP (last 3 results)  Recent Labs  06/08/13 2145 11/04/13 2328  PROBNP 16467.0* 13759.0*   CBG:  Recent Labs Lab 12/19/13 2130 12/20/13 0620 12/20/13 1046 12/20/13 1825 12/20/13 2115  GLUCAP 190* 117* 157* 131* 182*    Recent Results (from the past 240 hour(s))  CULTURE, BLOOD (ROUTINE X 2)     Status: None   Collection Time    12/18/13  9:50 PM      Result Value Ref Range Status   Specimen Description BLOOD LEFT HAND   Final   Special Requests BOTTLES DRAWN AEROBIC ONLY 4CC   Final   Culture  Setup Time     Final   Value: 12/19/2013 08:49      Performed at [facility]   Culture     Final   Value:        BLOOD CULTURE RECEIVED NO GROWTH TO DATE CULTURE WILL BE HELD FOR 5 DAYS BEFORE ISSUING A FINAL NEGATIVE REPORT     Performed at [facility]   Report Status PENDING   Incomplete  CULTURE, BLOOD (ROUTINE X 2)     Status: None   Collection Time    12/18/13 10:00 PM      Result Value Ref Range Status   Specimen Description BLOOD LEFT ARM   Final   Special Requests BOTTLES DRAWN AEROBIC ONLY 5CC   Final   Culture  Setup Time     Final   Value: 12/19/2013 08:51     Performed at [facility]   Culture     Final   Value:        BLOOD CULTURE RECEIVED NO GROWTH TO DATE CULTURE WILL BE HELD FOR 5 DAYS BEFORE ISSUING A FINAL NEGATIVE REPORT     Performed at [facility]   Report Status PENDING   Incomplete     Studies: Nm Pulmonary Perf And Vent  12/19/2013   CLINICAL DATA:  Shortness of breath with decreased oxygen saturation  EXAM: NUCLEAR MEDICINE VENTILATION - PERFUSION LUNG SCAN  Views: Anterior, posterior, left lateral, right lateral, RPO, LPO, RAO, LAO -ventilation and perfusion  Radionuclide: Technetium 64mDTPA -ventilation ; Technetium 962macroaggregated albumin-perfusion  Dose:  40.0 mCi-ventilation; 6.0 mCi-perfusion  Route of administration: Inhalation-ventilation; intravenous -perfusion  COMPARISON:  Chest radiograph December 18, 2013; ventilation and perfusion lung scan December 07, 2013  FINDINGS: Ventilation: Radiotracer uptake bilaterally is stable compared to recent prior study. There are a few patchy small ventilation defects. There is no segmental level ventilation defect.  Perfusion: There is virtually absent perfusion to the left upper lobe, a significant change compared to the previous study. There is extensive ventilation/ perfusion mismatch in the left upper lobe with perfusion defects without corresponding ventilation defects. No other significant perfusion defects are  identified.  IMPRESSION: Essentially absent perfusion throughout most of the left upper lobe, a new finding with significant ventilation/perfusion mismatch in the left upper lobe. This finding constitutes a high probability of pulmonary embolus.  Critical Value/emergent results were called by telephone at the time of interpretation on 12/19/2013 at 1:48 pm to Dr. ABCharlynne Cousins who verbally acknowledged these results.   Electronically Signed   By: WiLowella Grip.D.   On: 12/19/2013 13:48    Scheduled Meds: . acyclovir  400 mg Oral Daily  . amiodarone  200 mg Oral Daily  . antiseptic oral rinse  7 mL Mouth Rinse BID  . aspirin  81 mg Oral Daily  . dexamethasone  40 mg Oral Q7 days  . feeding supplement (ENSURE)  1 Container Oral TID BM  . ferrous sulfate  325 mg Oral Q breakfast  . insulin aspart  0-9 Units Subcutaneous TID WC  . ipratropium-albuterol  3 mL Nebulization Q6H  . midodrine  10 mg Oral TID WC  . mirtazapine  15 mg Oral QHS  . piperacillin-tazobactam (ZOSYN)  IV  2.25 g Intravenous 3 times per day  . tamsulosin  0.4 mg Oral Daily  . vancomycin  750 mg Intravenous Q M,W,F-HD  . Warfarin - Pharmacist Dosing Inpatient   Does not apply q1800   Continuous Infusions: . heparin 800 Units/hr (12/20/13 2120)    Time: 30 minutes  Barton Dubois  [facility] Pager [phone]. If 8PM-8AM, please contact night-coverage at [email or web address], password [facility] 12/20/2013, 10:37 PM  LOS: 2 days

## 2013-12-20 NOTE — Progress Notes (Signed)
Report given to dialysis RN. RN says they will get Pt by lunch time.

## 2013-12-20 NOTE — Progress Notes (Signed)
ANTICOAGULATION CONSULT NOTE - Follow Up   HL = 1.09 (goal 0.3 - 0.7 units/mL) Heparin dosing weight = 61 kg   Assessment: Repeat heparin level is 1.09 on the same rate that produced a therapeutic heparin level of 0.47 earlier today (900 units/hr).  RN reports bleeding in his arm after blood draws.  He also has significant bruising.  It is difficult to assess if this is a true lab value because blood is being drawn out of the same arm that heparin is infusing.     Plan:  Decrease heparin to 800 units/hr as the patient is having some bleeding  Heparin level/CBC in AM  May need foot sticks for more accurate heparin monitoring   Tretha Sciara, PharmD, BCPS 12/20/2013, 8:56 PM

## 2013-12-20 NOTE — Progress Notes (Signed)
Subjective:  VQ positive for LUL PE, pt no complaints, doesn't provide hx, wife at bedside  Objective: Vital signs in last 24 hours: Temp:  [97.6 F (36.4 C)-98.4 F (36.9 C)] 97.6 F (36.4 C) (10/07 0426) Pulse Rate:  [69-84] 69 (10/07 0426) Resp:  [18] 18 (10/07 0426) BP: (89-125)/(46-62) 125/62 mmHg (10/07 0426) SpO2:  [98 %-100 %] 98 % (10/07 0426) Weight:  [58.4 kg (128 lb 12 oz)] 58.4 kg (128 lb 12 oz) (10/07 0426) Weight change: -8.732 kg (-19 lb 4 oz)  Intake/Output from previous day: 10/06 0701 - 10/07 0700 In: 725.8 [P.O.:360; I.V.:28.8; IV Piggyback:100] Out: 100 [Urine:100] Intake/Output this shift:    Lab Results:  Recent Labs  12/18/13 1242 12/20/13 0008  WBC 14.1* 7.4  HGB 13.7 10.4*  HCT 43.5 32.6*  PLT 145* 138*   BMET:   Recent Labs  12/18/13 1242 12/20/13 0008  NA 143 139  K 4.3 4.6  CL 102 99  CO2 21 20  GLUCOSE 99 207*  BUN 31* 22  CREATININE 5.11* 4.02*  CALCIUM 8.5 7.7*  ALBUMIN  --  1.7*   No results found for this basename: PTH,  in the last 72 hours Iron Studies: No results found for this basename: IRON, TIBC, TRANSFERRIN, FERRITIN,  in the last 72 hours  Studies/Results: Nm Pulmonary Perf And Vent  12/19/2013   CLINICAL DATA:  Shortness of breath with decreased oxygen saturation  EXAM: NUCLEAR MEDICINE VENTILATION - PERFUSION LUNG SCAN  Views: Anterior, posterior, left lateral, right lateral, RPO, LPO, RAO, LAO -ventilation and perfusion  Radionuclide: Technetium 56m DTPA -ventilation ; Technetium 64m macroaggregated albumin-perfusion  Dose:  40.0 mCi-ventilation; 6.0 mCi-perfusion  Route of administration: Inhalation-ventilation; intravenous -perfusion  COMPARISON:  Chest radiograph December 18, 2013; ventilation and perfusion lung scan December 07, 2013  FINDINGS: Ventilation: Radiotracer uptake bilaterally is stable compared to recent prior study. There are a few patchy small ventilation defects. There is no segmental level  ventilation defect.  Perfusion: There is virtually absent perfusion to the left upper lobe, a significant change compared to the previous study. There is extensive ventilation/ perfusion mismatch in the left upper lobe with perfusion defects without corresponding ventilation defects. No other significant perfusion defects are identified.  IMPRESSION: Essentially absent perfusion throughout most of the left upper lobe, a new finding with significant ventilation/perfusion mismatch in the left upper lobe. This finding constitutes a high probability of pulmonary embolus.  Critical Value/emergent results were called by telephone at the time of interpretation on 12/19/2013 at 1:48 pm to Dr. Charlynne Cousins , who verbally acknowledged these results.   Electronically Signed   By: Lowella Grip M.D.   On: 12/19/2013 13:48   EXAM: Gen: Chronically ill appearing,  no apparent distress Resp: CTA without rales, rhonchi, or wheezes Cardio:  RRR without murmur or rub GI:  + BS, soft and nontender Extremities:  Trace edema Access:  AVF @ RUA with + bruit  Dialysis Orders: MWF @ [facility]  4 hrs 63 kg 400/A1.5 2K/2.25Ca Heparin 2000 U R IJ catheter, R BVT (2nd stage 6/19)  No Hectorol Aranesp 120 mcg on Wed No Venofer  Assessment/Plan:  1. Dyspnea / hypoxemia due to acute PE - on IV hep per primary 2. ESRD on HD 3. Hypotension/volume - on midodrine, below dry wt, no extra vol  4. Anemia - Hgb 13.7, outpatient Aranesp 120 mcg on Wed. 5. Metabolic bone disease - Last corrected Ca 9.6, P 4.5, iPTH 211; no Hectorol  or binders. 6. Nutrition - last Alb 2.2, on outpatient Megace. 7. MM - on Dexamethasone, followed by Dr. Lavera Guise @ [facility]. 8. DM Type 2 - no meds, Hgb A1C 5.6. 9. Sacral decubitus - Stage 4, wound care. 10. Chronic DVT - @ L proximal gastrocnemius vein of LLE per Doppler 9/24   Kelly Splinter MD pager [phone]    cell [phone] 12/20/2013, 8:02 AM

## 2013-12-20 NOTE — Progress Notes (Addendum)
ANTICOAGULATION CONSULT NOTE  Pharmacy Consult for Heparin and Coumadin Indication: pulmonary embolus  No Known Allergies  Patient Measurements: Height: 5\' 5"  (165.1 cm) Weight: 134 lb 0.6 oz (60.8 kg) IBW/kg (Calculated) : 61.5 Heparin Dosing Weight: 60.8 kg  Vital Signs: Temp: 97.9 F (36.6 C) (10/06 2043) Temp Source: Oral (10/06 2043) BP: 89/49 mmHg (10/06 2043) Pulse Rate: 78 (10/06 2043)  Labs:  Recent Labs  12/18/13 1242 12/18/13 2200 12/18/13 2358 12/19/13 0643 12/19/13 1614 12/20/13 0003 12/20/13 0008  HGB 13.7  --   --   --   --   --  10.4*  HCT 43.5  --   --   --   --   --  32.6*  PLT 145*  --   --   --   --   --  138*  APTT  --   --   --   --  37  --   --   LABPROT  --   --   --   --  15.7*  --  17.2*  INR  --   --   --   --  1.25  --  1.40  HEPARINUNFRC  --   --   --   --   --  0.98*  --   CREATININE 5.11*  --   --   --   --   --   --   TROPONINI  --  <0.30 <0.30 <0.30  --   --   --     Estimated Creatinine Clearance: 11.4 ml/min (by C-G formula based on Cr of 5.11).   Assessment: 73 y.o male with PE for anticoagulation  Goal of Therapy:  INR 2-3 Heparin level 0.3-0.7 units/ml Monitor platelets by anticoagulation protocol: Yes   Plan:  Decrease Heparin 900 units/hr Check heparin level in 8 hours. Coumadin 5 mg today  Phillis Knack, PharmD, BCPS  12/20/2013,12:56 AM  Addendum:  Repeat heparin level is therapeutic at 0.47. Pt is having significant bruising. MD aware. Will need ot monitor closely.  Plan: 1. Continue heparin gtt 900 units/hr 2. Check a heparin level to confirm dosing tonight 3. Daily heparin level and CBC  Salome Arnt, PharmD, BCPS Pager # [phone] 12/20/2013 11:43 AM

## 2013-12-20 NOTE — Progress Notes (Signed)
Patient is very shakey throughout the night. Unaware if this is baseline.  He complains of being cold. CBG was within normal limits.  Patient's wife is by the bedside.  Patient also has excessive bruising up and down both arms.  Heparin was reduced from 87ml/hr to 56ml/hr last night per pharmacy order.  Will continue to monitor.

## 2013-12-21 DIAGNOSIS — I2699 Other pulmonary embolism without acute cor pulmonale: Secondary | ICD-10-CM

## 2013-12-21 DIAGNOSIS — R5381 Other malaise: Secondary | ICD-10-CM

## 2013-12-21 LAB — PROTIME-INR
INR: 2.35 — AB (ref 0.00–1.49)
PROTHROMBIN TIME: 25.7 s — AB (ref 11.6–15.2)

## 2013-12-21 LAB — CBC
HCT: 31.9 % — ABNORMAL LOW (ref 39.0–52.0)
Hemoglobin: 10.4 g/dL — ABNORMAL LOW (ref 13.0–17.0)
MCH: 27.8 pg (ref 26.0–34.0)
MCHC: 32.6 g/dL (ref 30.0–36.0)
MCV: 85.3 fL (ref 78.0–100.0)
PLATELETS: 134 10*3/uL — AB (ref 150–400)
RBC: 3.74 MIL/uL — ABNORMAL LOW (ref 4.22–5.81)
RDW: 18.5 % — AB (ref 11.5–15.5)
WBC: 17.3 10*3/uL — ABNORMAL HIGH (ref 4.0–10.5)

## 2013-12-21 LAB — GLUCOSE, CAPILLARY
GLUCOSE-CAPILLARY: 164 mg/dL — AB (ref 70–99)
Glucose-Capillary: 99 mg/dL (ref 70–99)
Glucose-Capillary: 214 mg/dL — ABNORMAL HIGH (ref 70–99)
Glucose-Capillary: 158 mg/dL — ABNORMAL HIGH (ref 70–99)

## 2013-12-21 LAB — HEPARIN LEVEL (UNFRACTIONATED): HEPARIN UNFRACTIONATED: 0.67 [IU]/mL (ref 0.30–0.70)

## 2013-12-21 LAB — PROCALCITONIN: PROCALCITONIN: 0.52 ng/mL

## 2013-12-21 MED ORDER — WARFARIN SODIUM 1 MG PO TABS
1.0000 mg | ORAL_TABLET | Freq: Once | ORAL | Status: AC
  Administered 2013-12-21: 1 mg via ORAL
  Filled 2013-12-21 (×3): qty 1

## 2013-12-21 MED ORDER — VANCOMYCIN HCL 500 MG IV SOLR
500.0000 mg | INTRAVENOUS | Status: DC
  Filled 2013-12-21: qty 500

## 2013-12-21 NOTE — Progress Notes (Addendum)
ANTIBIOTIC CONSULT NOTE - Follow up  Pharmacy Consult for Vancomycin and Zosyn  Indication: pneumonia (HCAP)/ severe sepsis  No Known Allergies  Patient Measurements: Height: _0  (165.1 cm) Weight: 115 lb (52.164 kg) IBW/kg (Calculated) : 61.5  Vital Signs: Temp: 97.4 F (36.3 C) (10/08 0542) Temp Source: Oral (10/08 0542) BP: 114/44 mmHg (10/08 0542) Pulse Rate: 70 (10/08 0542)  Labs:  Recent Labs  12/18/13 1242 12/20/13 0008 12/21/13 0444  WBC 14.1* 7.4 17.3*  HGB 13.7 10.4* 10.4*  PLT 145* 138* 134*  CREATININE 5.11* 4.02*  --    ESRD   Medical History: Past Medical History  Diagnosis Date  . Pacemaker   . Shortness of breath     "when I take one of my chemo pills" (03/17/2013)  . Protein calorie malnutrition   . History of blood transfusion X 1    "count was too low"   . Cataract   . Hypertension   . COPD (chronic obstructive pulmonary disease)   . Pneumonia 06/09/2013; 08/2013; 10/2013; 11/2013  . Type II diabetes mellitus     no meds  . Arthritis     "fingers" (12/05/2013)  . ESRD (end stage renal disease) on dialysis     "MWF; [place]" (12/05/2013)  . Multiple myeloma   . Skin cancer of face     "once; left side of my face" (12/05/2013)   Assessment: Day # 4 Vancomycin an Zosyn HCAP coverage / severe sepsis in this 73 y.o male with ESRD on HD usual qMWF.  Recently in the hospital 9/22-9/27/15 for HCAP; treated with Vancomycin and Levaquin. Finished 10-day course on 12/15/13 with Vanc + Ceftazidime after dialysis sessions.  Now empirically on vanc and zosyn. WBC increased to 17.3K today. (.1 >7.4>17.3)  Afebrile since admit 10/5.  Weight down to 52 kg- will decrease vancomycin dose.   Blood cultures x 2 on 10/5 no growth to date. Sputum culture pending.  CXR showed persistent LLL infiltrate.   Goal of Therapy:  pre-dialysis Vancomycin levels 15-25 mcg/ml Appropriate Cefepime dose for renal function  Plan:  Decrease Vancomycin to 500 mg IV after each  HD-MWF. Continue zosyn 2.25 g IV q8hr. Will follow up culture data and clinical progress.  Arman Bogus, [place] Pager: [phone] 12/21/2013,11:14 AM

## 2013-12-21 NOTE — Progress Notes (Signed)
[facility] PROGRESS NOTE Interim History: 73 y.o. male with h/o copd, Multiple Myeloma now on Dialysis since June, recently left the hospital against medical advised on 9-13. He was receiving treatment at that time for PNA. He was still taking Levaquin return on to hosp 9.22.2015 and d/c on 9.27.2015 on vanc and cefepime and completed a 10 day course, type 2 DM, protein calorie malnutrition, ESRD on HD, woke up with sob, and hypoxia. His oxygen sats are 87% on 3 lit [place] oxygen on arrival. He was on his way to HD, and was brought by EMS to ED. His CXR revealed persistent infiltrates   Assessment/Plan: Acute respiratory failure with hypoxia due to HCAP (healthcare-associated pneumonia)/Severe sepsis -continue vanc and zosyn for another day and will switch to PO antibiotics (augmentin) in am. -no further fever, no cough and no signs of major aspiration base on speech evaluation -BC and sputum culture pending. CXR showed persistent LLL infiltrate. -BP improving and HR stabilized; will d/c telemetry  -V/q scan to rule out PE with results of high probability; continue coumadin and heparin per pharmacy  DVT/PE -as mentioned above will treat with heparin and coumadin. -day 3/5 for bridging -Hgb stable    Decubitus ulcer of sacral region, stage 4 -follow wound care rec's.  CKD (chronic kidney disease) stage V requiring chronic dialysis -renal on board -will follow rec's -continue HD    Multiple myeloma -continue on decadron. -further follow up and treatment per ONC as an outpatient  Protein-calorie malnutrition, severe - follow nutrition service rec's - continue feeding supplements -Appetite still poor   Code Status: DNR  DVT Prophylaxis:Heparin.  Family Communication: no family at bedside Disposition Plan: home with [facility] services.  Consultants:  PCCM  renal service   Procedures:  V/Q scan: high probability   LE duplex: positive for DVT  Antibiotics:  vanc and  cefepime  HPI/Subjective: No CP, no fever. Patient reports breathing is improving and better  Objective: Filed Vitals:   12/21/13 0204 12/21/13 0542 12/21/13 0743 12/21/13 1421  BP:  114/44  89/54  Pulse: 74 70  74  Temp:  97.4 F (36.3 C)  98.6 F (37 C)  TempSrc:  Oral  Oral  Resp: 16 18  20   Height:      Weight:  52.164 kg (115 lb)    SpO2: 99% 100% 94% 96%    Intake/Output Summary (Last 24 hours) at 12/21/13 1748 Last data filed at 12/21/13 1300  Gross per 24 hour  Intake    330 ml  Output      2 ml  Net    328 ml   Filed Weights   12/20/13 1250 12/20/13 1650 12/21/13 0542  Weight: 58.5 kg (128 lb 15.5 oz) 57.1 kg (125 lb 14.1 oz) 52.164 kg (115 lb)    Exam:  General: Alert, awake, oriented x3, reports breathing is easier and continue to improve HEENT: No bruits, no goiter. No JVD Heart: Regular rate and rhythm. No rubs or gallops Lungs: fair air movement, scattered rhonchi; no [place] crackles or wheezing Abdomen: Soft, nontender, nondistended, positive bowel sounds.  Skin: sacrum decubitus ulcer stage 4; no purulent discharge  Data Reviewed: Basic Metabolic Panel:  Recent Labs Lab 12/18/13 1242 12/20/13 0008  NA 143 139  K 4.3 4.6  CL 102 99  CO2 21 20  GLUCOSE 99 207*  BUN 31* 22  CREATININE 5.11* 4.02*  CALCIUM 8.5 7.7*  PHOS  --  5.0*   Liver Function Tests:  Recent Labs Lab 12/20/13 0008  ALBUMIN 1.7*   CBC:  Recent Labs Lab 12/18/13 1242 12/20/13 0008 12/21/13 0444  WBC 14.1* 7.4 17.3*  HGB 13.7 10.4* 10.4*  HCT 43.5 32.6* 31.9*  MCV 88.4 88.1 85.3  PLT 145* 138* 134*   Cardiac Enzymes:  Recent Labs Lab 12/18/13 2200 12/18/13 2358 12/19/13 0643  TROPONINI <0.30 <0.30 <0.30   BNP (last 3 results)  Recent Labs  06/08/13 2145 11/04/13 2328  PROBNP 16467.0* 13759.0*   CBG:  Recent Labs Lab 12/20/13 1825 12/20/13 2115 12/21/13 0616 12/21/13 1049 12/21/13 1706  GLUCAP 131* 182* 99 214* 164*    Recent Results  (from the past 240 hour(s))  CULTURE, BLOOD (ROUTINE X 2)     Status: None   Collection Time    12/18/13  9:50 PM      Result Value Ref Range Status   Specimen Description BLOOD LEFT HAND   Final   Special Requests BOTTLES DRAWN AEROBIC ONLY 4CC   Final   Culture  Setup Time     Final   Value: 12/19/2013 08:49     Performed at [facility]   Culture     Final   Value:        BLOOD CULTURE RECEIVED NO GROWTH TO DATE CULTURE WILL BE HELD FOR 5 DAYS BEFORE ISSUING A FINAL NEGATIVE REPORT     Performed at [facility]   Report Status PENDING   Incomplete  CULTURE, BLOOD (ROUTINE X 2)     Status: None   Collection Time    12/18/13 10:00 PM      Result Value Ref Range Status   Specimen Description BLOOD LEFT ARM   Final   Special Requests BOTTLES DRAWN AEROBIC ONLY 5CC   Final   Culture  Setup Time     Final   Value: 12/19/2013 08:51     Performed at [facility]   Culture     Final   Value:        BLOOD CULTURE RECEIVED NO GROWTH TO DATE CULTURE WILL BE HELD FOR 5 DAYS BEFORE ISSUING A FINAL NEGATIVE REPORT     Performed at [facility]   Report Status PENDING   Incomplete     Studies: No results found.  Scheduled Meds: . acyclovir  400 mg Oral Daily  . amiodarone  200 mg Oral Daily  . antiseptic oral rinse  7 mL Mouth Rinse BID  . aspirin  81 mg Oral Daily  . dexamethasone  40 mg Oral Q7 days  . feeding supplement (ENSURE)  1 Container Oral TID BM  . ferrous sulfate  325 mg Oral Q breakfast  . insulin aspart  0-9 Units Subcutaneous TID WC  . ipratropium-albuterol  3 mL Nebulization Q6H  . midodrine  10 mg Oral TID WC  . mirtazapine  15 mg Oral QHS  . piperacillin-tazobactam (ZOSYN)  IV  2.25 g Intravenous 3 times per day  . tamsulosin  0.4 mg Oral Daily  . [START ON 12/22/2013] vancomycin  500 mg Intravenous Q M,W,F-HD  . warfarin  1 mg Oral ONCE-1800  . Warfarin - Pharmacist Dosing Inpatient   Does not apply q1800   Continuous  Infusions: . heparin 800 Units/hr (12/21/13 1459)    Time: < 30 minutes  Barton Dubois  [facility] Pager [phone]. If 8PM-8AM, please contact night-coverage at [email or web address], password [facility] 12/21/2013, 5:48 PM  LOS: 3 days

## 2013-12-21 NOTE — Evaluation (Addendum)
Clinical/Bedside Swallow Evaluation Patient Details  Name: Scott [name] MRN: [id number] Date of Birth: Dec 08, 1940  Today's Date: 12/21/2013 Time: 0806-0825 SLP Time Calculation (min): 19 min  Past Medical History:  Past Medical History  Diagnosis Date  . Pacemaker   . Shortness of breath     "when I take one of my chemo pills" (03/17/2013)  . Protein calorie malnutrition   . History of blood transfusion X 1    "count was too low"   . Cataract   . Hypertension   . COPD (chronic obstructive pulmonary disease)   . Pneumonia 06/09/2013; 08/2013; 10/2013; 11/2013  . Type II diabetes mellitus     no meds  . Arthritis     "fingers" (12/05/2013)  . ESRD (end stage renal disease) on dialysis     "MWF; [place]" (12/05/2013)  . Multiple myeloma   . Skin cancer of face     "once; left side of my face" (12/05/2013)   Past Surgical History:  Past Surgical History  Procedure Laterality Date  . Appendectomy    . Partial nephrectomy Right 2000's    "cut out ~ 1/2"   . Insert / replace / remove pacemaker  07/2012  . Bascilic vein transposition Right 07/10/2013    Procedure: BASILIC VEIN TRANSPOSITION- FIRST STAGE ;  Surgeon: Scott Erie, MD;  Location: [place];  Service: Vascular;  Laterality: Right;  . Insertion of dialysis catheter Right 08/28/2013    Procedure: INSERTION OF DIALYSIS CATHETER in Right Internal Jugular;  Surgeon: Scott Posner, MD;  Location: [place];  Service: Vascular;  Laterality: Right;  . Bascilic vein transposition Right 09/01/2013    Procedure: BASILIC VEIN TRANSPOSITION - 2ND STAGE AVF CREATION;  Surgeon: Scott Posner, MD;  Location: [place];  Service: Vascular;  Laterality: Right;  . Av fistula placement     HPI:  73 y.o.  with h/o COPD, pneumonia, type 2 DM, protein calorie malnutrition, ESRD on HD admitted with SOB and hypoxia. CXR 10/5 underlying emphysema. Persistent infiltrate left base. Recent infiltrate right base is cleared. No new opacity.     Assessment / Plan /  Recommendation Clinical Impression  Pt. required moderate encouragement/persuation to participate in swallow evaluation (wife present) and exhibits decreased insight into deficits.  Observations revealed decreased laryngeal elevation on palpation with possible delayed swallow initiation.  No cough, throat clear or wet vocal quality present although pt.'s with COPD are at higher aspiration risk (silent) due to a sometimes shorter apenic period.  Educated pt. and wife on increased risk and reviewed strategies to mitigate risk.  Wife stated pt. diagnosed with pna only in September, however chart review shows possible pna in June.  SLP will defer MBS at this time, however if he is admitted one more time with pna, he will need an objective assessment (MBS).  Recommend continue regular diet texture and thin liquids with strict adherance to swallow precautions.  Wife reports meat has to be cut at home; pt. with specific food preferences and suspect a downgrade may limit food choices and adequate nutrition.       Aspiration Risk  Moderate    Diet Recommendation Regular;Thin liquid   Liquid Administration via: Straw;Cup Medication Administration: Whole meds with liquid Supervision: Patient able to self feed;Intermittent supervision to cue for compensatory strategies Compensations: Slow rate;Small sips/bites Postural Changes and/or Swallow Maneuvers: Seated upright 90 degrees;Upright 30-60 min after meal    Other  Recommendations Oral Care Recommendations: Oral care BID  Follow Up Recommendations  None    Frequency and Duration        Pertinent Vitals/Pain No pain         Swallow Study          Oral/Motor/Sensory Function Overall Oral Motor/Sensory Function: Appears within functional limits for tasks assessed   Ice Chips Ice chips: Not tested   Thin Liquid Thin Liquid: Impaired Presentation: Straw Pharyngeal  Phase Impairments: Decreased hyoid-laryngeal movement    Nectar Thick Nectar  Thick Liquid: Not tested   Honey Thick Honey Thick Liquid: Not tested   Puree Puree: Not tested (refused applesauce)   Solid   GO    Solid: Impaired Oral Phase Impairments: Reduced lingual movement/coordination Oral Phase Functional Implications:  (delayed transit)       Scott [name], Scott [name] 12/21/2013,8:40 AM  Scott [name] [name].Ed [employer] [phone]

## 2013-12-21 NOTE — Progress Notes (Signed)
Name: Scott [name] MRN: [id number] DOB: [birth year]    ADMISSION DATE:  12/18/2013 CONSULTATION DATE:  12/19/13  REFERRING MD :  Dr. Aileen Fass  CHIEF COMPLAINT:  LLL infiltrate   BRIEF PATIENT DESCRIPTION: 73 y/o M with PMH of MM on decadron and recent admissions for PNA (completed IV abx on 10/2) who presented to [facility] ER with shortness of breath.  Found to have new LLL infiltrate, PCCM consulted for evaluation.    SIGNIFICANT EVENTS  9/13  Admit to [facility] hospital with PNA 9/22  Admit with R PNA, hypoxemia  10/5  Admit with new LLL PNA  STUDIES:  9/26  CT Chest >> patchy densities throughout both lungs, concern for RLL rounded density, basilar densities 10/5  CXR >> persistent L basilar infiltrate, clearing of R  SUBJECTIVE:   VITAL SIGNS: Temp:  [97.4 F (36.3 C)-98 F (36.7 C)] 97.4 F (36.3 C) (10/08 0542) Pulse Rate:  [60-94] 70 (10/08 0542) Resp:  [14-24] 18 (10/08 0542) BP: (85-132)/(44-74) 114/44 mmHg (10/08 0542) SpO2:  [94 %-100 %] 94 % (10/08 0743) Weight:  [115 lb (52.164 kg)-128 lb 15.5 oz (58.5 kg)] 115 lb (52.164 kg) (10/08 0542)  PHYSICAL EXAMINATION: General:  Chronically ill in NAD  Neuro:  AAOx4,speech clear, MAE HEENT:  Mm pink/moist, no jvd Cardiovascular: s1s2 rrr, no m/r/g Lungs:  resp's even/non-labored on [place] O2, lungs bilaterally diminished  Abdomen:  Round/soft, bsx4 active  Musculoskeletal:  No acute deformities  Skin:  Warm/dry, 2+ pitting LE edema   Recent Labs Lab 12/18/13 1242 12/20/13 0008  NA 143 139  K 4.3 4.6  CL 102 99  CO2 21 20  BUN 31* 22  CREATININE 5.11* 4.02*  GLUCOSE 99 207*    Recent Labs Lab 12/18/13 1242 12/20/13 0008 12/21/13 0444  HGB 13.7 10.4* 10.4*  HCT 43.5 32.6* 31.9*  WBC 14.1* 7.4 17.3*  PLT 145* 138* 134*   Nm Pulmonary Perf And Vent  12/19/2013   CLINICAL DATA:  Shortness of breath with decreased oxygen saturation  EXAM: NUCLEAR MEDICINE VENTILATION - PERFUSION LUNG SCAN  Views: Anterior,  posterior, left lateral, right lateral, RPO, LPO, RAO, LAO -ventilation and perfusion  Radionuclide: Technetium 35mDTPA -ventilation ; Technetium 93macroaggregated albumin-perfusion  Dose:  40.0 mCi-ventilation; 6.0 mCi-perfusion  Route of administration: Inhalation-ventilation; intravenous -perfusion  COMPARISON:  Chest radiograph December 18, 2013; ventilation and perfusion lung scan December 07, 2013  FINDINGS: Ventilation: Radiotracer uptake bilaterally is stable compared to recent prior study. There are a few patchy small ventilation defects. There is no segmental level ventilation defect.  Perfusion: There is virtually absent perfusion to the left upper lobe, a significant change compared to the previous study. There is extensive ventilation/ perfusion mismatch in the left upper lobe with perfusion defects without corresponding ventilation defects. No other significant perfusion defects are identified.  IMPRESSION: Essentially absent perfusion throughout most of the left upper lobe, a new finding with significant ventilation/perfusion mismatch in the left upper lobe. This finding constitutes a high probability of pulmonary embolus.  Critical Value/emergent results were called by telephone at the time of interpretation on 12/19/2013 at 1:48 pm to Dr. ABCharlynne Cousins who verbally acknowledged these results.   Electronically Signed   By: WiLowella Grip.D.   On: 12/19/2013 13:48    ASSESSMENT / PLAN:  Recurrent PNA  - [age]/o M with MM, severe deconditioning, and recurrent PNA's.  DDx includes: most concerning for aspiration PNA, opportunist infection, myeloma, or  medication side effect with Velecade.  He is edentulous, has FTT and wife has concerns for pts swallowing.     Presumed PE - based on high prob PE  Plan: Assess swallow function with SLP Continue empiric abx Assess PCT Pulmonary hygiene Trend CXR Assess sputum, though not likely to yield organism given abx Will hold bronch for  now as patient would be very high risk   Dyspnea Presumed COPD - emphysema noted on CXR, 60+ yrs smoking (1 1/2 PPD)  Plan: Volume status per HD Change duonebs to Q6, recommend for nebs for d/c as well.  He is not strong enough to inhale Advair etc.   Oxygen to keep sats 90-95%  Multiple Myeloma  Protein Calorie Malnutrition   Plan: Continue Decadron  Obtain records from [place]  Ensure TID  Noe Gens, NP-C [place] Pgr: [phone] or [phone] 12/21/2013, 11:54 AM    STAFFNOTE  - personally eval patient. Looks same as 2 days ago. sTill looks miserable with ecog 4. Rx for HCAP and PE. Will see him in pulmonary in several weeks for followup. Long term prognosis poor . Bronch high risk now. PCCM will sign off   Dr. Brand Males, M.D., [facility].C.P Pulmonary and Critical Care Medicine Staff Physician [place] [facility] Pager: [phone], If no answer or between  15:00h - 7:00h: call [phone]  12/21/2013 12:14 PM

## 2013-12-21 NOTE — Progress Notes (Signed)
[facility] for Heparin and Coumadin Indication: pulmonary embolus  No Known Allergies  Patient Measurements: Height: 5' 5"  (165.1 cm) Weight: 115 lb (52.164 kg) IBW/kg (Calculated) : 61.5 Heparin Dosing Weight: 60.8 kg  Vital Signs: Temp: 97.4 F (36.3 C) (10/08 0542) Temp Source: Oral (10/08 0542) BP: 114/44 mmHg (10/08 0542) Pulse Rate: 70 (10/08 0542)  Labs:  Recent Labs  12/18/13 1242 12/18/13 2200 12/18/13 2358 12/19/13 0643 12/19/13 1614  12/20/13 0008 12/20/13 1036 12/20/13 1843 12/21/13 0444  HGB 13.7  --   --   --   --   --  10.4*  --   --  10.4*  HCT 43.5  --   --   --   --   --  32.6*  --   --  31.9*  PLT 145*  --   --   --   --   --  138*  --   --  134*  APTT  --   --   --   --  37  --   --   --   --   --   LABPROT  --   --   --   --  15.7*  --  17.2*  --   --  25.7*  INR  --   --   --   --  1.25  --  1.40  --   --  2.35*  HEPARINUNFRC  --   --   --   --   --   < >  --  0.47 1.09* 0.67  CREATININE 5.11*  --   --   --   --   --  4.02*  --   --   --   TROPONINI  --  <0.30 <0.30 <0.30  --   --   --   --   --   --   < > = values in this interval not displayed.  Estimated Creatinine Clearance: 12.4 ml/min (by C-G formula based on Cr of 4.02).   Assessment: 73 y.o male with h/o multiple myeloma and ESRD on HD, now on IV heparin and warfarin overlap day #3/ 5 minimum for PE/DVT.   Reports of bruising on both arms.  Last night RN reported patient had some bleeding in his arm after blood draws. No bleeding reported today. Today the heparin level is therapeutic at 0.67 on 800 units/hr. INR = 2.35, increased from 1.4 yesterday.  Overlap with IV heparin (or lovenox) must continue minimum of 5 days and INR >2.   CBC low but about same as yesterday.  On amiodarone PTA which continues.    Goal of Therapy:  INR 2-3 Heparin level 0.3-0.7 units/ml Monitor platelets by anticoagulation protocol: Yes   Plan:  Continue Heparin drip to  800 units/hr Daily heparin level, INR and CBC.  Coumadin 1 mg today  Nicole Cella, RPh Clinical Pharmacist Pager: [phone]  12/21/2013 10:57 AM

## 2013-12-21 NOTE — Progress Notes (Signed)
Subjective:  No complaints today   Objective: Vital signs in last 24 hours: Temp:  [97.4 F (36.3 C)-98 F (36.7 C)] 97.4 F (36.3 C) (10/08 0542) Pulse Rate:  [60-94] 70 (10/08 0542) Resp:  [14-24] 18 (10/08 0542) BP: (85-132)/(44-74) 114/44 mmHg (10/08 0542) SpO2:  [94 %-100 %] 94 % (10/08 0743) Weight:  [52.164 kg (115 lb)-58.5 kg (128 lb 15.5 oz)] 52.164 kg (115 lb) (10/08 0542) Weight change: 0.1 kg (3.5 oz)  Intake/Output from previous day: 10/07 0701 - 10/08 0700 In: 390 [P.O.:240; IV Piggyback:150] Out: 1101 [Stool:1] Intake/Output this shift: Total I/O In: 60 [P.O.:60] Out: -   Lab Results:  Recent Labs  12/20/13 0008 12/21/13 0444  WBC 7.4 17.3*  HGB 10.4* 10.4*  HCT 32.6* 31.9*  PLT 138* 134*   BMET:   Recent Labs  12/18/13 1242 12/20/13 0008  NA 143 139  K 4.3 4.6  CL 102 99  CO2 21 20  GLUCOSE 99 207*  BUN 31* 22  CREATININE 5.11* 4.02*  CALCIUM 8.5 7.7*  ALBUMIN  --  1.7*   No results found for this basename: PTH,  in the last 72 hours Iron Studies: No results found for this basename: IRON, TIBC, TRANSFERRIN, FERRITIN,  in the last 72 hours  Studies/Results: No results found. EXAM: Gen: Chronically ill appearing,  no apparent distress Resp: CTA without rales, rhonchi, or wheezes Cardio:  RRR without murmur or rub GI:  + BS, soft and nontender Extremities:  Trace edema Access:  AVF @ RUA with + bruit  Dialysis Orders: MWF @ [facility]  4 hrs 63 kg 400/A1.5 2K/2.25Ca Heparin 2000 U R IJ catheter, R BVT (2nd stage 6/19)  No Hectorol Aranesp 120 mcg on Wed No Venofer  Assessment/Plan:  1. Dyspnea / hypoxemia due to acute PE - on IV hep, coumadin 2. ESRD on HD 3. Hypotension/volume - on midodrine, below dry wt, no extra vol  4. Anemia - Hgb 13.7, outpatient Aranesp 120 mcg on Wed. 5. HPTH - Ca/ P /pth in range, no meds 6. Nutrition - last Alb 2.2, Megace stopped for VTE issue, Remeron started by pall care MD for appetite 7. MM - on decadron  only, followed by Dr. Lavera Guise @ [facility] 8. Sacral decubitus - Stage 4, wound care 9. Debility - will need to see if he can do HD in a chair, which will be a requirement for going home or to a SNF 10. FTT / EOL - pall care saw pt and wife and DNR was recorded.  They noted pt would most likely never agree to SNF placement as well.     Kelly Splinter MD pager [phone]    cell [phone] 12/21/2013, 11:45 AM

## 2013-12-21 NOTE — Progress Notes (Signed)
Patient has bad arm bruising with development of blood blisters up and down left and right arm that ooze consistently through mepliex and gauze.

## 2013-12-21 NOTE — Plan of Care (Signed)
Problem: Acute Rehab PT Goals(only PT should resolve) Goal: Pt Will Go Supine/Side To Sit Using bedrails if needed Goal: Patient Will Perform Sitting Balance With no SOB for 10 minutes

## 2013-12-21 NOTE — Evaluation (Signed)
Physical Therapy Evaluation Patient Details Name: Scott [name] MRN: [id number] DOB: 11/06/1940 Today's Date: 12/21/2013   History of Present Illness  73 year old male with continuing respiratory difficulties with SOB.  Has PNA  with PE's and history multiple myeloma, COPD (smoker), HTN, ESRD, HD, malnutrition.  Clinical Impression  Pt has been evaluated with wife present who is actively discouraging PT involvement with pt by trying to move him herself alone.  Talked with her and pt about purpose of rehab, and she agreed to let PT continue with him.  Very distrustful on both their parts, and are not willing to consider pt needs inpatient stay at [facility].  Will continue to encourage this option but pt and wife are voicing going home.  This does not look safe at all now.    Follow Up Recommendations SNF;Supervision/Assistance - 24 hour    Equipment Recommendations  None recommended by PT    Recommendations for Other Services       Precautions / Restrictions Precautions Precautions: Fall Precaution Comments: Wife has been assisting alone at home and reports falls when pt gets up at home alone. Restrictions Weight Bearing Restrictions: No      Mobility  Bed Mobility Overal bed mobility: Needs Assistance Bed Mobility: Rolling;Supine to Sit Rolling: Min assist   Supine to sit: Mod assist (with wife as she jumps in to see him)     General bed mobility comments: Assistance at trunk provided by wife.   Transfers Overall transfer level: Needs assistance Equipment used: 1 person hand held assist Transfers: Sit to/from Stand Sit to Stand: Mod assist         General transfer comment: Pt has limited tolerance with activity after Speech therapy to bedside earlier.  Has tolerated minimal sidesteps to reposition to get back to bed  Ambulation/Gait             General Gait Details: attempted, but unable.    Stairs            Wheelchair Mobility    Modified Rankin  (Stroke Patients Only)       Balance Overall balance assessment: Needs assistance Sitting-balance support: Bilateral upper extremity supported;Feet supported Sitting balance-Leahy Scale: Poor Sitting balance - Comments: Leans into wife with his head and using UE's consistently Postural control: Other (comment) (forward lean) Standing balance support: During functional activity;Bilateral upper extremity supported Standing balance-Leahy Scale: Poor Standing balance comment: Unable to stand withjout holding him as he is too weak for walker today                             Pertinent Vitals/Pain Pain Assessment: Faces Faces Pain Scale: Hurts little more Pain Location: UE skin with any touch Pain Descriptors / Indicators: Tender Pain Intervention(s): Limited activity within patient's tolerance;Monitored during session;Repositioned BP was 114/44, pulse 70 and O2 sat 94%.  Back to bed after attempts to sit up.    Home Living Family/patient expects to be discharged to:: Private residence Living Arrangements: Spouse/significant other Available Help at Discharge: Family;Available 24 hours/day Type of Home: Mobile home Home Access: Ramped entrance     Home Layout: One level Home Equipment: [place] - 2 wheels;Wheelchair - manual;Bedside commode Additional Comments: Previous therapy was attempted but pt had not had consistent start due to 2 recent hospitalizations    Prior Function Level of Independence: Needs assistance   Gait / Transfers Assistance Needed: Requiring Wife to assist him to sidestep  to wheelchair  ADL's / Homemaking Assistance Needed: wife assists as needed with bathing/dressing  Comments: Pt has been using a WC for mobility as he has gradually declined in health     Hand Dominance   Dominant Hand: Right    Extremity/Trunk Assessment   Upper Extremity Assessment: Generalized weakness           Lower Extremity Assessment: Generalized weakness       Cervical / Trunk Assessment: Kyphotic  Communication   Communication: No difficulties  Cognition Arousal/Alertness: Awake/alert Behavior During Therapy: Agitated;Anxious Overall Cognitive Status: History of cognitive impairments - at baseline       Memory: Decreased short-term memory              General Comments General comments (skin integrity, edema, etc.): Pt is on 2L O2 and reports from wife that he is still smoking.      Exercises        Assessment/Plan    PT Assessment Patient needs continued PT services  PT Diagnosis Difficulty walking;Generalized weakness   PT Problem List Decreased strength;Decreased activity tolerance;Decreased balance;Decreased mobility;Decreased cognition;Cardiopulmonary status limiting activity;Decreased skin integrity;Pain  PT Treatment Interventions DME instruction;Gait training;Functional mobility training;Therapeutic activities;Therapeutic exercise;Balance training;Neuromuscular re-education;Patient/family education   PT Goals (Current goals can be found in the Care Plan section) Acute Rehab PT Goals Patient Stated Goal: to go home PT Goal Formulation: With patient/family Time For Goal Achievement: 01/04/14 Potential to Achieve Goals: Fair    Frequency Min 3X/week   Barriers to discharge Inaccessible home environment;Other (comment) (Pt is requiring more help than wife can reasonably provide) PT spoke to wife about SNF but had a bad experience previously.  Talked to her about finding a better situation for him but she states she wants to just take him home.  Not a safe transition at present.    Co-evaluation               End of Session Equipment Utilized During Treatment: Oxygen Activity Tolerance: Patient limited by fatigue;Treatment limited secondary to agitation Patient left: in bed;with call bell/phone within reach;with family/visitor present Nurse Communication: Mobility status         Time: 9678-9381 PT Time  Calculation (min): 25 min   Charges:   PT Evaluation $Initial PT Evaluation Tier I: 1 Procedure PT Treatments $Therapeutic Activity: 8-22 mins   PT G Codes:          Ramond Dial 12/25/13, 10:08 AM Mee Hives, PT MS Acute Rehab Dept. Number: [phone]

## 2013-12-22 LAB — CBC
HEMATOCRIT: 30.6 % — AB (ref 39.0–52.0)
Hemoglobin: 9.8 g/dL — ABNORMAL LOW (ref 13.0–17.0)
MCH: 28.5 pg (ref 26.0–34.0)
MCHC: 32 g/dL (ref 30.0–36.0)
MCV: 89 fL (ref 78.0–100.0)
Platelets: 107 10*3/uL — ABNORMAL LOW (ref 150–400)
RBC: 3.44 MIL/uL — ABNORMAL LOW (ref 4.22–5.81)
RDW: 18.4 % — ABNORMAL HIGH (ref 11.5–15.5)
WBC: 13.5 10*3/uL — ABNORMAL HIGH (ref 4.0–10.5)

## 2013-12-22 LAB — PROTIME-INR
INR: 3.71 — ABNORMAL HIGH (ref 0.00–1.49)
Prothrombin Time: 36.8 seconds — ABNORMAL HIGH (ref 11.6–15.2)

## 2013-12-22 LAB — GLUCOSE, CAPILLARY
Glucose-Capillary: 85 mg/dL (ref 70–99)
Glucose-Capillary: 127 mg/dL — ABNORMAL HIGH (ref 70–99)

## 2013-12-22 LAB — HEPARIN LEVEL (UNFRACTIONATED): Heparin Unfractionated: 0.38 IU/mL (ref 0.30–0.70)

## 2013-12-22 MED ORDER — LORAZEPAM 0.5 MG PO TABS: 0.5000 mg | ORAL_TABLET | Freq: Three times a day (TID) | ORAL | Status: AC | PRN

## 2013-12-22 MED ORDER — HYDROCODONE-ACETAMINOPHEN 5-325 MG PO TABS: 1.0000 | ORAL_TABLET | Freq: Four times a day (QID) | ORAL | Status: AC | PRN

## 2013-12-22 MED ORDER — IPRATROPIUM-ALBUTEROL 0.5-2.5 (3) MG/3ML IN SOLN
3.0000 mL | Freq: Four times a day (QID) | RESPIRATORY_TRACT | Status: DC
  Filled 2013-12-22: qty 3

## 2013-12-22 MED ORDER — IPRATROPIUM-ALBUTEROL 0.5-2.5 (3) MG/3ML IN SOLN: 3.0000 mL | Freq: Three times a day (TID) | RESPIRATORY_TRACT | Status: AC

## 2013-12-22 MED ORDER — MORPHINE SULFATE (CONCENTRATE) 10 MG /0.5 ML PO SOLN: 5.0000 mg | ORAL | Status: AC | PRN

## 2013-12-22 MED ORDER — IPRATROPIUM-ALBUTEROL 0.5-2.5 (3) MG/3ML IN SOLN: 3.0000 mL | Freq: Four times a day (QID) | RESPIRATORY_TRACT | Status: DC | PRN

## 2013-12-22 MED ORDER — ENSURE PUDDING PO PUDG: 1.0000 | Freq: Three times a day (TID) | ORAL | Status: AC

## 2013-12-22 NOTE — Progress Notes (Signed)
UR completed Ansel Ferrall K. Mitzy Naron, RN, BSN, [place], CCM  12/22/2013 3:05 PM

## 2013-12-22 NOTE — Progress Notes (Signed)
Upon arrival back to unit from lunch, RN was notified by Jonelle Sidle, charge RN that pt and spouse had left the unit. Per Tiffany, pt would not allow staff to apply dressing to bleeding upper extremity. Charge RN also states that she had to follow  pt and spouse to elevator to give discharge instructions.

## 2013-12-22 NOTE — Discharge Summary (Signed)
Physician Discharge Summary  Scott [name]:[id number] DOB: [birth year] DOA: 12/18/2013  PCP: No primary provider on file.  Admit date: 12/18/2013 Discharge date: 12/22/2013  Time spent: >30 minutes  Recommendations for Outpatient Follow-up:  Comfort care Patient has withdrawn care and has decided to stop HD  Discharge Diagnoses:  Principal Problem:   Acute respiratory failure with hypoxia Active Problems:   Multiple myeloma   Protein-calorie malnutrition, severe   HCAP (healthcare-associated pneumonia)   CKD (chronic kidney disease) stage V requiring chronic dialysis   Severe sepsis   Decubitus ulcer of sacral region, stage 4   Dyspnea   Discharge Condition: stable. Plan is for patient to go home with family care and [facility] services. Hospice has been requested.  Diet recommendation: comfort feeding  Filed Weights   12/20/13 1650 12/21/13 0542 12/22/13 0415  Weight: 57.1 kg (125 lb 14.1 oz) 52.164 kg (115 lb) 50.349 kg (111 lb)    History of present illness:  73 y.o. male with h/o copd, pneumonia recently treated , type 2 DM, protein calorie malnutrition, ESRD on HD, woke up with sob, and hypoxia. His oxygen sats are 87% on 3 lit  oxygen on arrival. He was on his way to HD, and was brought by EMS to ED. His CXR revealed persistent infiltrates. He was referred to medical service for admission. His point of care troponin came positive.    Hospital Course:  Acute respiratory failure with hypoxia due to HCAP (healthcare-associated pneumonia)/Severe sepsis  -no further antibiotics at this point. Patient wants just comfort care -will provide prescription for nebulizer to be used 4 times a day as recommended by PCCM  And will give prescription for Roxanol and  Ativan to be use for comfort care  DVT/PE  -patient was on day 4 of bridging therapy; INR 3.75 (therapeutic for 48 hours) -after discussing with patient and organizing outpatient follow up and blood work; he expressed he is  not planing on take any further medication (especially coumadin) -"I'm tired and done; just want to go home" -no anticoagulation was prescribed given patient decision -he is also stopping HD treatments at this point; which will make not imperative to treat for PE or any other condition at this point.  Decubitus ulcer of sacral region, stage 4  -continue wound care  CKD (chronic kidney disease) stage V requiring chronic dialysis  -patient has decided to stop HD -wants just comfort  -renal service informed and discussed with patient regarding what to expect -hospice and [facility] service will be arrange -will provide Roxanol and ativan for comfort measures and symptomatic treatment.  Multiple myeloma  -continue on decadron.  -further follow up and treatment per ONC as an outpatient  -prognosis very poor and patient not looking to pursue further treatment  Protein-calorie malnutrition, severe  -continue feeding supplements  -Appetite still very poor -given decision of withdrawing care; plan is for comfort feeding.   Procedures:  See below for x-ray reports  V/Q scan: high probability   LE duplex: positive for DVT   Consultations:  Renal service  Palliative care  PCCM  Discharge Exam: Filed Vitals:   12/22/13 0415  BP: 90/70  Pulse: 105  Temp: 97.4 F (36.3 C)  Resp: 18   General: Alert, awake, oriented x3, reports breathing is ok. Patient refusing care, HD and any kind of treatments. He expressed he is tired of everything and will like to go home. After discussing outcome of his decisions, he once again reported he knows what  he is doing and expressed those are his wishes. He is planning not to go for further HD here or in [place]   HEENT: No bruits, no goiter. No JVD  Heart: Regular rate and rhythm. No rubs or gallops  Lungs: fair air movement, scattered rhonchi; no Zorion crackles or wheezing  Abdomen: Soft, nontender, nondistended, positive bowel sounds.  Skin:  sacrum decubitus ulcer stage 4; no purulent discharge   Discharge Instructions You were cared for by a hospitalist during your hospital stay. If you have any questions about your discharge medications or the care you received while you were in the hospital after you are discharged, you can call the unit and asked to speak with the hospitalist on call if the hospitalist that took care of you is not available. Once you are discharged, your primary care physician will handle any further medical issues. Please note that NO REFILLS for any discharge medications will be authorized once you are discharged, as it is imperative that you return to your primary care physician (or establish a relationship with a primary care physician if you do not have one) for your aftercare needs so that they can reassess your need for medications and monitor your lab values.  Discharge Instructions   Diet - low sodium heart healthy    Complete by:  As directed      Discharge instructions    Complete by:  As directed   Hospice to see patient at home Patient has decline further HD treatment and therapy Medication changed/adjusted for truly important comfort/quality regimen. Hospice to continue cleaning medication list and focus on symptoms relief     Increase activity slowly    Complete by:  As directed           Current Discharge Medication List    START taking these medications   Details  feeding supplement, ENSURE, (ENSURE) PUDG Take 1 Container by mouth 3 (three) times daily between meals. Refills: 0    !! ipratropium-albuterol (DUONEB) 0.5-2.5 (3) MG/3ML SOLN Take 3 mLs by nebulization 3 (three) times daily. Qty: 360 mL, Refills: 1    LORazepam (ATIVAN) 0.5 MG tablet Take 1 tablet (0.5 mg total) by mouth every 8 (eight) hours as needed for anxiety (SOB). Qty: 30 tablet, Refills: 0    Morphine Sulfate (MORPHINE CONCENTRATE) 10 mg / 0.5 ml concentrated solution Take 0.25 mLs (5 mg total) by mouth every 4  (four) hours as needed for severe pain. Qty: 30 mL, Refills: 0     !! - Potential duplicate medications found. Please discuss with provider.    CONTINUE these medications which have CHANGED   Details  HYDROcodone-acetaminophen (NORCO/VICODIN) 5-325 MG per tablet Take 1 tablet by mouth every 6 (six) hours as needed for moderate pain. Qty: 30 tablet, Refills: 0      CONTINUE these medications which have NOT CHANGED   Details  acyclovir (ZOVIRAX) 400 MG tablet Take 400 mg by mouth daily.    amiodarone (PACERONE) 200 MG tablet Take 200 mg by mouth daily.    aspirin 81 MG chewable tablet Chew 1 tablet (81 mg total) by mouth daily.    dexamethasone (DECADRON) 4 MG tablet Take 40 mg by mouth every 7 (seven) days. Every Tuesday.    !! ipratropium-albuterol (DUONEB) 0.5-2.5 (3) MG/3ML SOLN Take 3 mLs by nebulization every 4 (four) hours as needed (for shortness of breath). Use 3 times daily for 5 days then stop. Qty: 360 mL, Refills: 0    midodrine (  PROAMATINE) 10 MG tablet Take 1 tablet (10 mg total) by mouth 3 (three) times daily with meals. Qty: 90 tablet, Refills: 0    multivitamin (RENA-VIT) TABS tablet Take 1 tablet by mouth at bedtime. Refills: 0    tamsulosin (FLOMAX) 0.4 MG CAPS capsule Take 0.4 mg by mouth daily.    docusate sodium 100 MG CAPS Take 100 mg by mouth 2 (two) times daily. Qty: 10 capsule, Refills: 0     !! - Potential duplicate medications found. Please discuss with provider.    STOP taking these medications     ferrous sulfate 325 (65 FE) MG tablet      megestrol (MEGACE) 40 MG/ML suspension      pioglitazone (ACTOS) 15 MG tablet      ACCU-CHEK AVIVA PLUS test strip      ACCU-CHEK SOFTCLIX LANCETS lancets        No Known Allergies Follow-up Information   Follow up with Bufford Buttner, MD. (Primary Care appt __________pending - Call at time of discharge and schedule: contact Mary (wall in clinic but [facility] will give appt time for this patient d/t  PH__________)    Specialty:  Family Medicine   Contact information:   [place] [address] [phone]       Follow up with [facility]. Occupational hygienist, Physical Therapy, [place], and Social Worker Services to start within 24-48 hours of discharge)    Contact information:   [place] [address] [place], [place] [address] Phone: [phone] Fax: [phone]       Follow up with PARRETT,TAMMY, NP On 01/04/2014. (Appt at 11:30 AM for pulmonary follow up. )    Specialty:  Nurse Practitioner   Contact information:   [place] [place] [address] [phone]       The results of significant diagnostics from this hospitalization (including imaging, microbiology, ancillary and laboratory) are listed below for reference.    Significant Diagnostic Studies: Dg Chest 2 View  12/05/2013   CLINICAL DATA:  Increased shortness of breath  EXAM: CHEST  2 VIEW  COMPARISON:  12/05/2013  FINDINGS: There are low lung volumes. There is a dual ligament right-sided central venous catheter in unchanged position. There is a dual lead AICD. There is no focal parenchymal opacity, pleural effusion, or pneumothorax. The heart and mediastinal contours are unremarkable.  The osseous structures are unremarkable.  IMPRESSION: No active cardiopulmonary disease.   Electronically Signed   By: Kathreen Devoid   On: 12/05/2013 20:17   Ct Chest Wo Contrast  12/09/2013   CLINICAL DATA:  Shortness of breath.  EXAM: CT CHEST WITHOUT CONTRAST  TECHNIQUE: Multidetector CT imaging of the chest was performed following the standard protocol without IV contrast.  COMPARISON:  Chest radiograph 12/09/2013 and CT 12/31/2012  FINDINGS: Patient has a left cardiac pacemaker. There is a dialysis catheter and the tip extends into the upper right atrium. Small lymph nodes in the mediastinum are unchanged. Ascending thoracic aorta measures 3.8 cm. There are coronary artery calcifications. Small amount  of pericardial fluid.  There is an exophytic hyperdense structure involving the anterior right hepatic lobe that measures 1.6 cm. This could represent an proteinaceous or hemorrhagic cyst. Patient has renal cysts which are incompletely evaluated. No acute abnormality in the upper abdomen. Small amount of right pleural fluid.  The trachea and mainstem bronchi are patent. There are scattered ground-glass densities in the upper lungs. Some of the lung densities have a tree-in-bud  configuration and suggest an infectious or inflammatory etiology. There is a focal nodular lesion in the right lower lobe on sequence 3, image 35 that measures 1 cm. This lesion has a solid component and surrounding hazy densities. There is consolidation in the posterior left lower lobe and a small amount of consolidation in the posterior right lower lobe. No acute bone abnormality.  IMPRESSION: Patchy parenchymal densities throughout both lungs. The morphology of these densities are most compatible with an infectious or inflammatory process. These densities are new from 12/31/2012. Consider a follow-up CT in 4 weeks to ensure resolution of the larger nodular densities, particularly the lesion in the right lower lobe. The consolidation at the lung bases could also be associated with aspiration.  Exophytic hyperdense structure involving the right hepatic lobe. This is indeterminate but could represent a hemorrhagic or proteinaceous cyst. Evidence for bilateral renal cysts.   Electronically Signed   By: Markus Daft M.D.   On: 12/09/2013 14:38   Nm Pulmonary Perf And Vent  12/19/2013   CLINICAL DATA:  Shortness of breath with decreased oxygen saturation  EXAM: NUCLEAR MEDICINE VENTILATION - PERFUSION LUNG SCAN  Views: Anterior, posterior, left lateral, right lateral, RPO, LPO, RAO, LAO -ventilation and perfusion  Radionuclide: Technetium 69mDTPA -ventilation ; Technetium 916macroaggregated albumin-perfusion  Dose:  40.0 mCi-ventilation; 6.0  mCi-perfusion  Route of administration: Inhalation-ventilation; intravenous -perfusion  COMPARISON:  Chest radiograph December 18, 2013; ventilation and perfusion lung scan December 07, 2013  FINDINGS: Ventilation: Radiotracer uptake bilaterally is stable compared to recent prior study. There are a few patchy small ventilation defects. There is no segmental level ventilation defect.  Perfusion: There is virtually absent perfusion to the left upper lobe, a significant change compared to the previous study. There is extensive ventilation/ perfusion mismatch in the left upper lobe with perfusion defects without corresponding ventilation defects. No other significant perfusion defects are identified.  IMPRESSION: Essentially absent perfusion throughout most of the left upper lobe, a new finding with significant ventilation/perfusion mismatch in the left upper lobe. This finding constitutes a high probability of pulmonary embolus.  Critical Value/emergent results were called by telephone at the time of interpretation on 12/19/2013 at 1:48 pm to Dr. ABCharlynne Cousins who verbally acknowledged these results.   Electronically Signed   By: WiLowella Grip.D.   On: 12/19/2013 13:48   Nm Pulmonary Perf And Vent  12/07/2013   CLINICAL DATA:  Shortness of breath.  EXAM: NUCLEAR MEDICINE VENTILATION - PERFUSION LUNG SCAN  TECHNIQUE: Ventilation images were obtained in multiple projections using inhaled aerosol technetium 99 M DTPA. Perfusion images were obtained in multiple projections after intravenous injection of Tc-9935mA.  RADIOPHARMACEUTICALS:  40 mCi Tc-38m10mA aerosol and 6 mCi Tc-38m 75m COMPARISON:  Chest radiograph 12/05/2013  FINDINGS: Ventilation: The ventilation images are very limited due to clumping of the radiopharmaceutical throughout both lungs.  Perfusion: No wedge shaped peripheral perfusion defects to suggest acute pulmonary embolism. There is a photopenic defect in the left chest related to the  cardiac pacemaker. There may be artifact from an arm on the right lateral perfusion images. Costophrenic angles are not sharp on some of the perfusion images but no large perfusion abnormality in these areas.  IMPRESSION: Very low probability for pulmonary embolism.   Electronically Signed   By: Adam Markus Daft   On: 12/07/2013 15:50   Dg Chest Portable 1 View  12/18/2013   CLINICAL DATA:  Difficulty breathing ; patient on  dialysis  EXAM: PORTABLE CHEST - 1 VIEW  COMPARISON:  Chest radiograph December 09, 2013 and chest CT December 09, 2013  FINDINGS: The previously noted infiltrate in the right base is cleared. There is persistent consolidation in the left base. There is underlying emphysema. No new opacity is appreciable. Heart size and pulmonary vascularity are normal. No adenopathy. Central catheter tip is at the cavoatrial junction without pneumothorax. Pacemaker leads are attached to the right atrium and right ventricle.  IMPRESSION: Underlying emphysema. Persistent infiltrate left base. Recent infiltrate right base is cleared. No new opacity.   Electronically Signed   By: Lowella Grip M.D.   On: 12/18/2013 13:00   Dg Chest Port 1 View  12/09/2013   CLINICAL DATA:  New dyspnea.  On antibiotics for pneumonia.  EXAM: PORTABLE CHEST - 1 VIEW  COMPARISON:  12/05/2013  FINDINGS: Two lead left-sided pacemaker is unchanged. Right IJ such a venous catheter is unchanged with tip over the right atrium. Lungs are adequately inflated with persistent mild bibasilar opacification with slight worsening in the left base which may be due to atelectasis or infection. Cardiomediastinal silhouette and remainder of the exam is unchanged.  IMPRESSION: Persistent mild bibasilar opacification with slight worsening in the left base which may be due to atelectasis or infection.  Right IJ central venous catheter unchanged.   Electronically Signed   By: Marin Olp M.D.   On: 12/09/2013 10:55   Dg Chest Port 1  View  11/25/2013   CLINICAL DATA:  Code sepsis.  Fever.  EXAM: PORTABLE CHEST - 1 VIEW  COMPARISON:  Chest radiograph performed 11/04/2013  FINDINGS: The lungs are well-aerated. Retrocardiac airspace opacity raises concern for pneumonia, given the patient's symptoms. There is no evidence of pleural effusion or pneumothorax.  The cardiomediastinal silhouette is borderline normal in size. A pacemaker is noted overlying the left chest wall, with leads ending overlying the right atrium and right ventricle. A right-sided dual-lumen catheter is noted ending about the cavoatrial junction. No acute osseous abnormalities are seen.  IMPRESSION: Retrocardiac airspace opacification raises concern for pneumonia.   Electronically Signed   By: Garald Balding M.D.   On: 11/25/2013 04:10    Microbiology: Recent Results (from the past 240 hour(s))  CULTURE, BLOOD (ROUTINE X 2)     Status: None   Collection Time    12/18/13  9:50 PM      Result Value Ref Range Status   Specimen Description BLOOD LEFT HAND   Final   Special Requests BOTTLES DRAWN AEROBIC ONLY 4CC   Final   Culture  Setup Time     Final   Value: 12/19/2013 08:49     Performed at [facility]   Culture     Final   Value:        BLOOD CULTURE RECEIVED NO GROWTH TO DATE CULTURE WILL BE HELD FOR 5 DAYS BEFORE ISSUING A FINAL NEGATIVE REPORT     Performed at [facility]   Report Status PENDING   Incomplete  CULTURE, BLOOD (ROUTINE X 2)     Status: None   Collection Time    12/18/13 10:00 PM      Result Value Ref Range Status   Specimen Description BLOOD LEFT ARM   Final   Special Requests BOTTLES DRAWN AEROBIC ONLY 5CC   Final   Culture  Setup Time     Final   Value: 12/19/2013 08:51     Performed at [facility]  Culture     Final   Value:        BLOOD CULTURE RECEIVED NO GROWTH TO DATE CULTURE WILL BE HELD FOR 5 DAYS BEFORE ISSUING A FINAL NEGATIVE REPORT     Performed at [facility]   Report Status PENDING    Incomplete     Labs: Basic Metabolic Panel:  Recent Labs Lab 12/18/13 1242 12/20/13 0008  NA 143 139  K 4.3 4.6  CL 102 99  CO2 21 20  GLUCOSE 99 207*  BUN 31* 22  CREATININE 5.11* 4.02*  CALCIUM 8.5 7.7*  PHOS  --  5.0*   Liver Function Tests:  Recent Labs Lab 12/20/13 0008  ALBUMIN 1.7*   CBC:  Recent Labs Lab 12/18/13 1242 12/20/13 0008 12/21/13 0444 12/22/13 0535  WBC 14.1* 7.4 17.3* 13.5*  HGB 13.7 10.4* 10.4* 9.8*  HCT 43.5 32.6* 31.9* 30.6*  MCV 88.4 88.1 85.3 89.0  PLT 145* 138* 134* 107*   Cardiac Enzymes:  Recent Labs Lab 12/18/13 2200 12/18/13 2358 12/19/13 0643  TROPONINI <0.30 <0.30 <0.30   BNP: BNP (last 3 results)  Recent Labs  06/08/13 2145 11/04/13 2328  PROBNP 16467.0* 13759.0*   CBG:  Recent Labs Lab 12/21/13 1049 12/21/13 1706 12/21/13 2117 12/22/13 0625 12/22/13 1121  GLUCAP 214* 164* 158* 85 127*    Signed:  Barton Dubois  [facility] 12/22/2013, 2:38 PM

## 2013-12-22 NOTE — Progress Notes (Signed)
MD notified that pt is refusing dialysis this morning as well as general pt care.RN and Agricultural consultant to bedside to address concerns.

## 2013-12-22 NOTE — Progress Notes (Signed)
Patient's wife, [name], called as patient's prescriptions left at desk.  Per [name], will pick up prescriptions on 12/23/2013.

## 2013-12-22 NOTE — Progress Notes (Signed)
Subjective:  No SOB or other complaints  Objective: Vital signs in last 24 hours: Temp:  [97.4 F (36.3 C)-97.9 F (36.6 C)] 97.6 F (36.4 C) (10/09 1454) Pulse Rate:  [79-105] 79 (10/09 1454) Resp:  [18-20] 18 (10/09 1454) BP: (90-107)/(55-81) 107/55 mmHg (10/09 1454) SpO2:  [94 %-98 %] 96 % (10/09 1454) Weight:  [50.349 kg (111 lb)] 50.349 kg (111 lb) (10/09 0415) Weight change: -8.151 kg (-17 lb 15.5 oz)  Intake/Output from previous day: 10/08 0701 - 10/09 0700 In: 420.8 [P.O.:180; I.V.:90.8; IV Piggyback:150] Out: 1 [Stool:1] Intake/Output this shift:    Lab Results:  Recent Labs  12/21/13 0444 12/22/13 0535  WBC 17.3* 13.5*  HGB 10.4* 9.8*  HCT 31.9* 30.6*  PLT 134* 107*   BMET:   Recent Labs  12/20/13 0008  NA 139  K 4.6  CL 99  CO2 20  GLUCOSE 207*  BUN 22  CREATININE 4.02*  CALCIUM 7.7*  ALBUMIN 1.7*   No results found for this basename: PTH,  in the last 72 hours Iron Studies: No results found for this basename: IRON, TIBC, TRANSFERRIN, FERRITIN,  in the last 72 hours  Studies/Results: No results found. EXAM: Gen: Chronically ill appearing,  no apparent distress Resp: CTA without rales, rhonchi, or wheezes Cardio:  RRR without murmur or rub GI:  + BS, soft and nontender Extremities:  Trace edema Access:  AVF @ RUA with + bruit  Dialysis Orders: MWF @ [facility]  4 hrs 63 kg 400/A1.5 2K/2.25Ca Heparin 2000 U R IJ catheter, R BVT (2nd stage 6/19)  No Hectorol Aranesp 120 mcg on Wed No Venofer  Assessment:  1. Dyspnea / hypoxemia due to acute PE 2. ESRD on HD 3. Hypotension on midodrine 4. Anemia on Aranesp 5. HPTH no meds 6. Nutrition - last Alb 2.2, Megace stopped for VTE issue, Remeron started by pall care MD for appetite 7. MM - followed by Dr. Lavera Guise @ [facility], failed attempts at chemo with 2 different agents earlier in 2015 , both had to be stopped due to side effects. Is on decadron only now.  8. Sacral decubitus -  Stage 4, wound care 9. Debility 10. DNR  Plan - had discussion w pt and wife about his prognosis and overall condition.  I told him that the cancer was likely progressing and making his body weaker, and that the cancer doctors had tried chemo x 2 and his body didn't tolerate it.  That we don't know how long he has.  Offered options to continue HD or to stop at this time and explained what would happen with withdrawal of HD (no pain or suffering, no CHF, drift into uremic coma over 2 weeks period then die).  He says he is going home and that he won't be going to dialysis in [place]. He won't discuss home hospice, says it is up to his wife.  They have had some bad experience in the past with hospice that was linked to a bad financial outcome, so don't know what they will want to do about this. Have d/w primary MD. Plan for now is no further dialysis, possible hospice assistance at home if he leaves today, defer to primary.   Kelly Splinter MD pager [phone]    cell [phone] 12/22/2013, 3:02 PM

## 2013-12-22 NOTE — Discharge Instructions (Addendum)

## 2013-12-22 NOTE — Progress Notes (Signed)
ANTICOAGULATION CONSULT NOTE-follow up -  Pharmacy Consult for Heparin and Coumadin Indication: pulmonary embolus  No Known Allergies  Patient Measurements: Height: 5' 5"  (165.1 cm) Weight: 111 lb (50.349 kg) IBW/kg (Calculated) : 61.5 Heparin Dosing Weight: 50.3 kg  Vital Signs: Temp: 97.4 F (36.3 C) (10/09 0415) Temp Source: Oral (10/09 0415) BP: 90/70 mmHg (10/09 0415) Pulse Rate: 105 (10/09 0415)  Labs:  Recent Labs  12/19/13 1614  12/20/13 0008  12/20/13 1843 12/21/13 0444 12/22/13 0535  HGB  --   < > 10.4*  --   --  10.4* 9.8*  HCT  --   --  32.6*  --   --  31.9* 30.6*  PLT  --   --  138*  --   --  134* 107*  APTT 37  --   --   --   --   --   --   LABPROT 15.7*  --  17.2*  --   --  25.7* 36.8*  INR 1.25  --  1.40  --   --  2.35* 3.71*  HEPARINUNFRC  --   < >  --   < > 1.09* 0.67 0.38  CREATININE  --   --  4.02*  --   --   --   --   < > = values in this interval not displayed.  Estimated Creatinine Clearance: 12 ml/min (by C-G formula based on Cr of 4.02).   Assessment: 73 y.o male with h/o multiple myeloma and ESRD on HD, now on IV heparin and warfarin overlap day #4/ 5 minimum for PE/DVT.  This AM the heparin level is 0.38 on 800 units/hr.  INR = 3.71 increased from 2.35.  Hgb decreased to 9/8, pltc down to 107K from 145K on admission. Dose was reduced to 1 mg yesterday. Expect INR to begin to taper down.  Reports of bruising on both arms and has had some bleeding from his arms after blood draws. No other bleeding events reported.   On amiodarone PTA which continues. RN reports that pt is refusing dialysis this morning as well as general pt care.   Difficult stick for INR monitoring reported and have been doing foot sticks for blood draws.  Will need to schedule INR monitoring with hemodialysis.     Goal of Therapy:  INR 2-3 Heparin level 0.3-0.7 units/ml Monitor platelets by anticoagulation protocol: Yes   Plan:  Continue Heparin drip to 800  units/hr Daily heparin level, INR and CBC.  No coumadin today- await INR to begin to decrease closer to goal. If discharged home today I would recommend no coumadin today then 44m daily with INR check on Monday 10/12 with HD.    RNicole Cella RPh Clinical Pharmacist Pager: [phone] 12/22/2013 9:11 AM

## 2013-12-22 NOTE — Progress Notes (Signed)
Pt refused morning medications and dressing changes to BUE and sacrum. Pt also refused to allow RN and nurse tech to change soiled sheets and reposition pt. Call bell within reach, bed in lowest position. Will continue to monitor closely.

## 2013-12-25 LAB — CULTURE, BLOOD (ROUTINE X 2)
CULTURE: NO GROWTH
Culture: NO GROWTH

## 2014-01-04 ENCOUNTER — Inpatient Hospital Stay: Payer: Medicare HMO | Admitting: Adult Health

## 2014-01-14 DEATH — deceased

## 2015-05-14 IMAGING — CR DG CHEST 1V
2 series · 2 of 2 positions shown · non-contrast
Comparison: Study obtained earlier in the day

CLINICAL DATA: Central catheter placement

EXAM:
CHEST - 1 VIEW

[AP (1 of 2)]
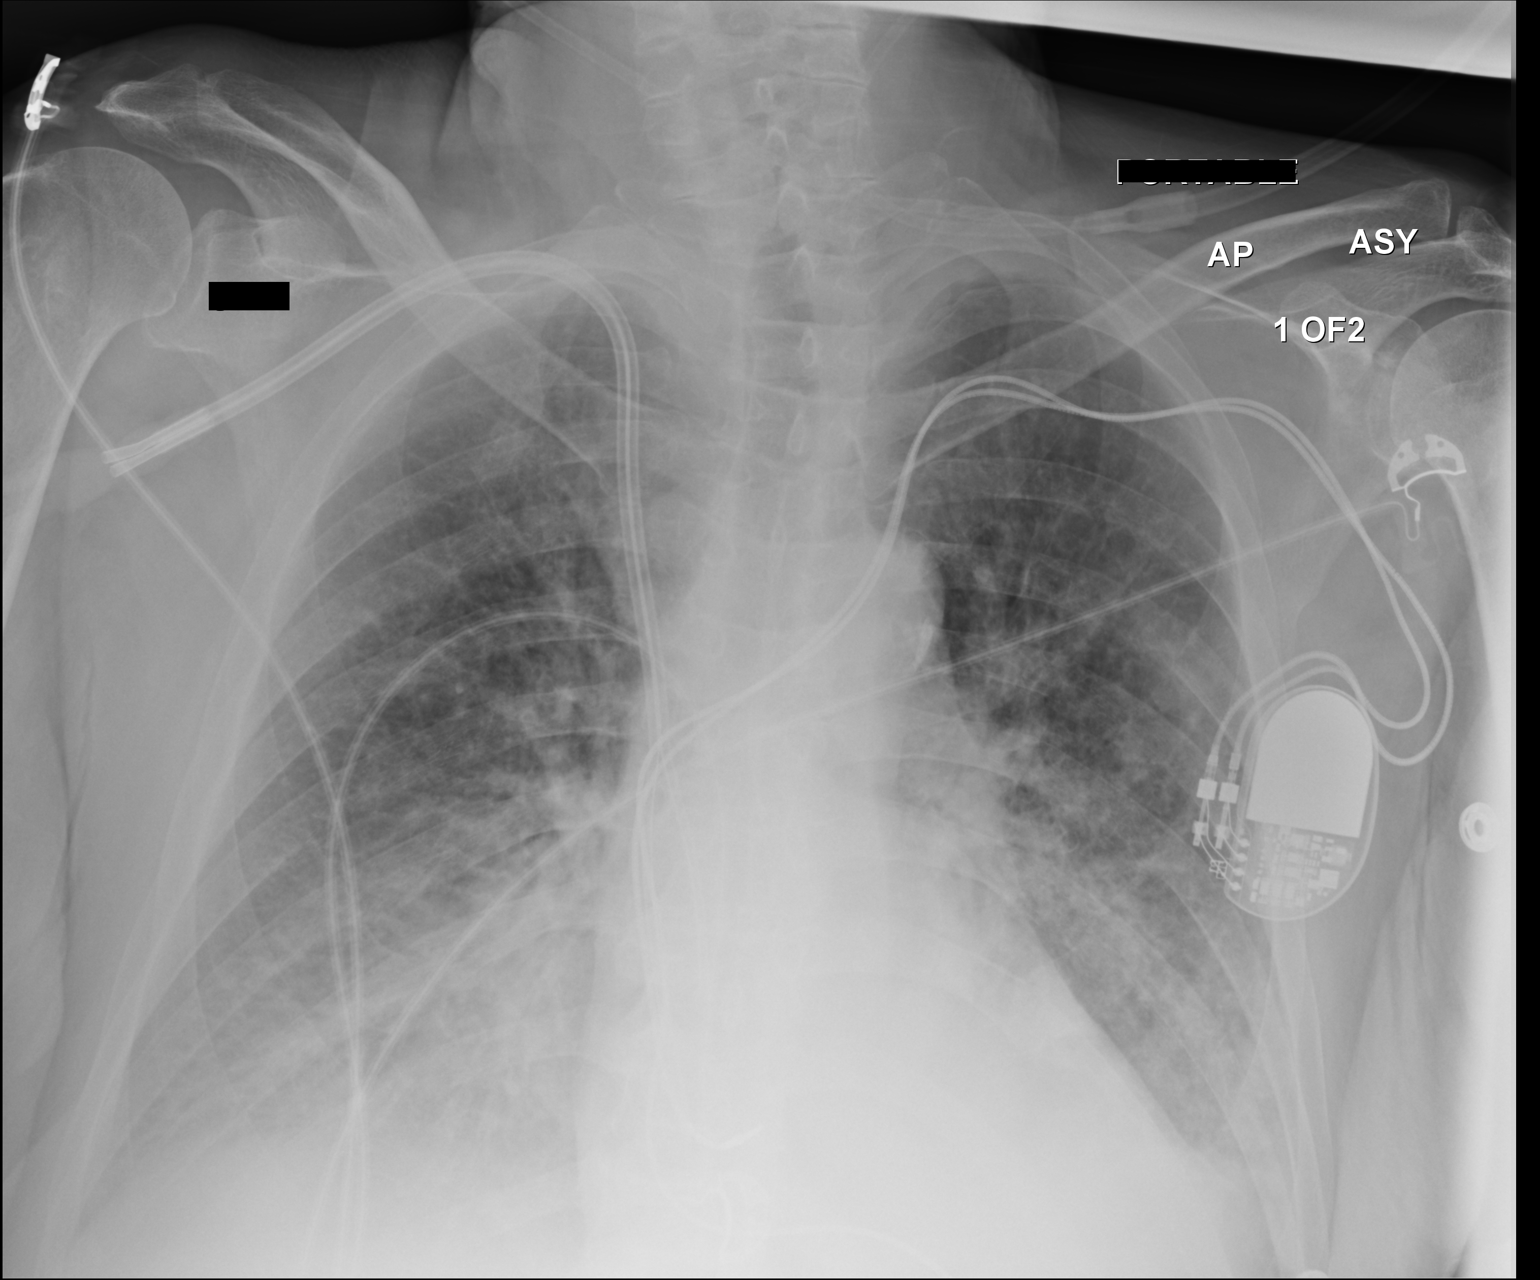

[AP (2 of 2)]
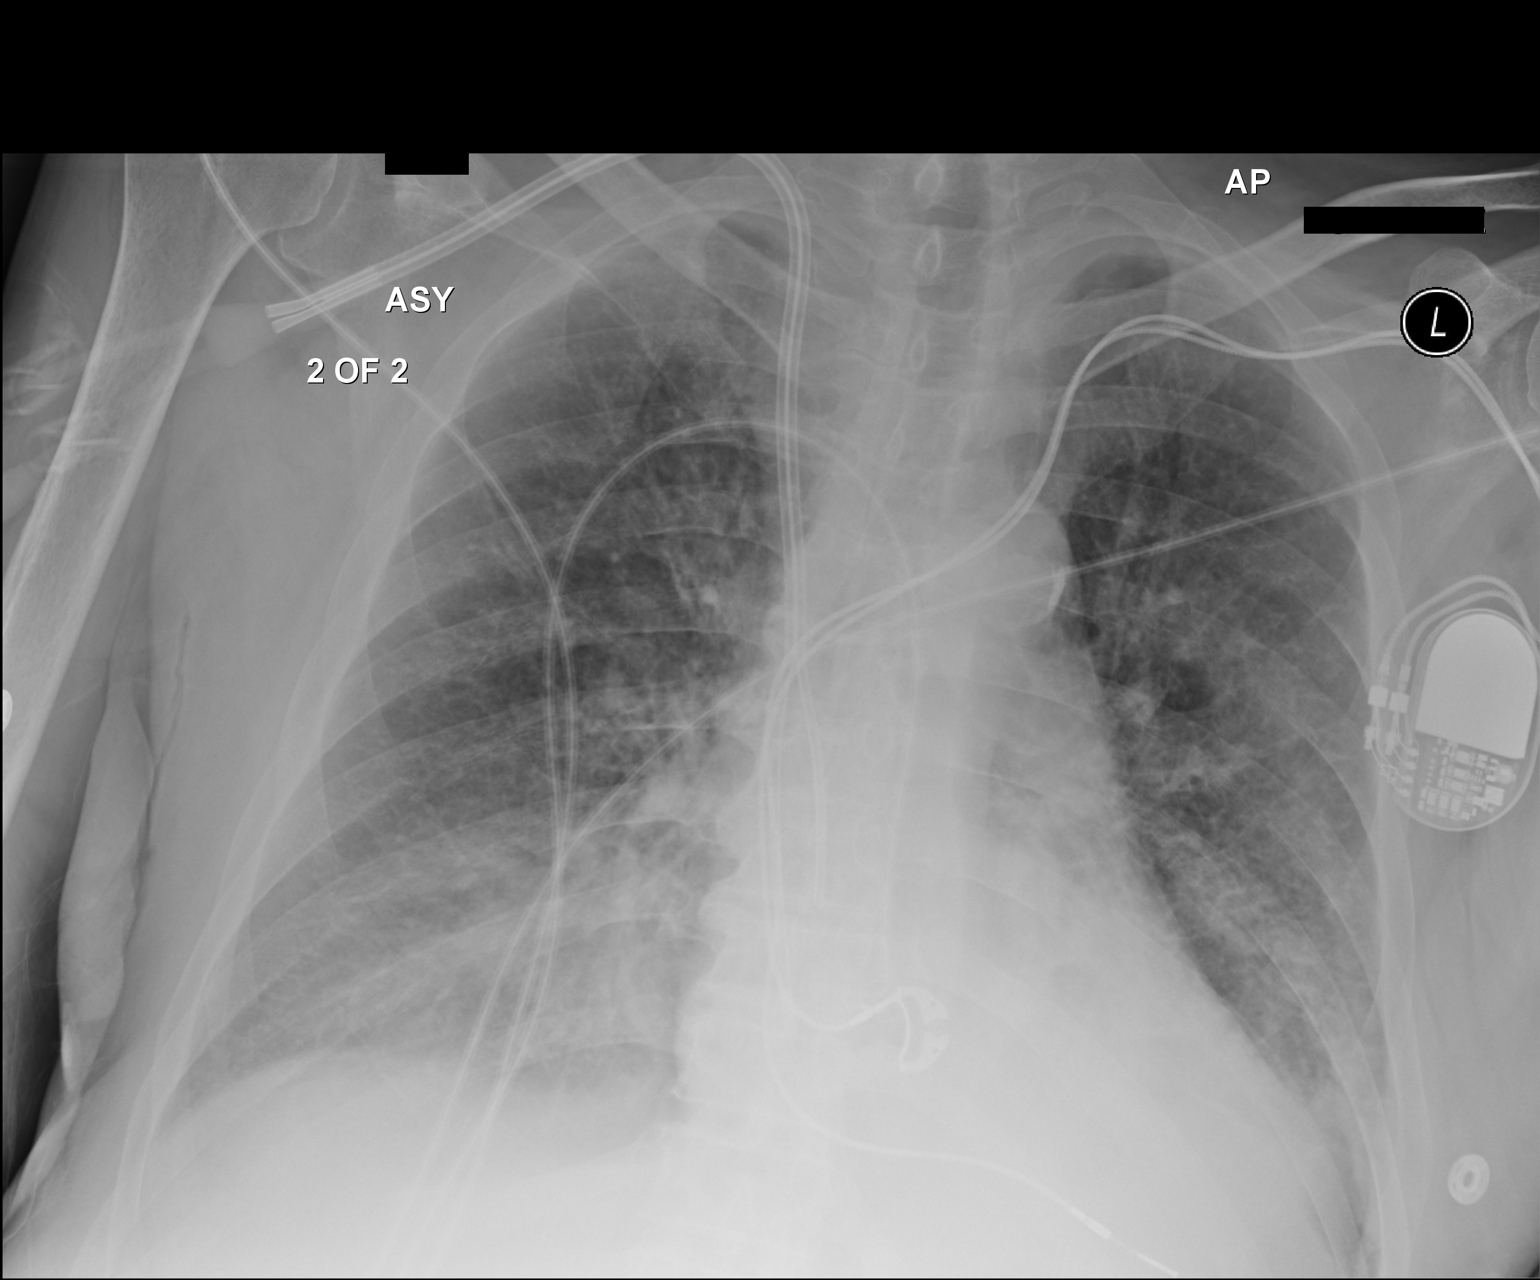

[2 of 2 positions shown; findings below may reference images not displayed]

FINDINGS: Dual-lumen central catheter tip is at the cavoatrial junction. No
pneumothorax. There is moderate generalized interstitial edema with
patchy alveolar edema in the bases. Heart is mildly enlarged with
mild pulmonary venous hypertension. No adenopathy.
IMPRESSION: Central catheter tip at cavoatrial junction. No pneumothorax.
Underlying congestive heart failure. Superimposed pneumonia in the
bases cannot be excluded radiographically.

## 2015-05-14 IMAGING — CR DG CHEST 1V PORT
1 series · 1 of 1 positions shown · non-contrast
Comparison: Chest radiograph from 08/23/2013

CLINICAL DATA: Shortness of breath.

EXAM:
PORTABLE CHEST - 1 VIEW

[AP]
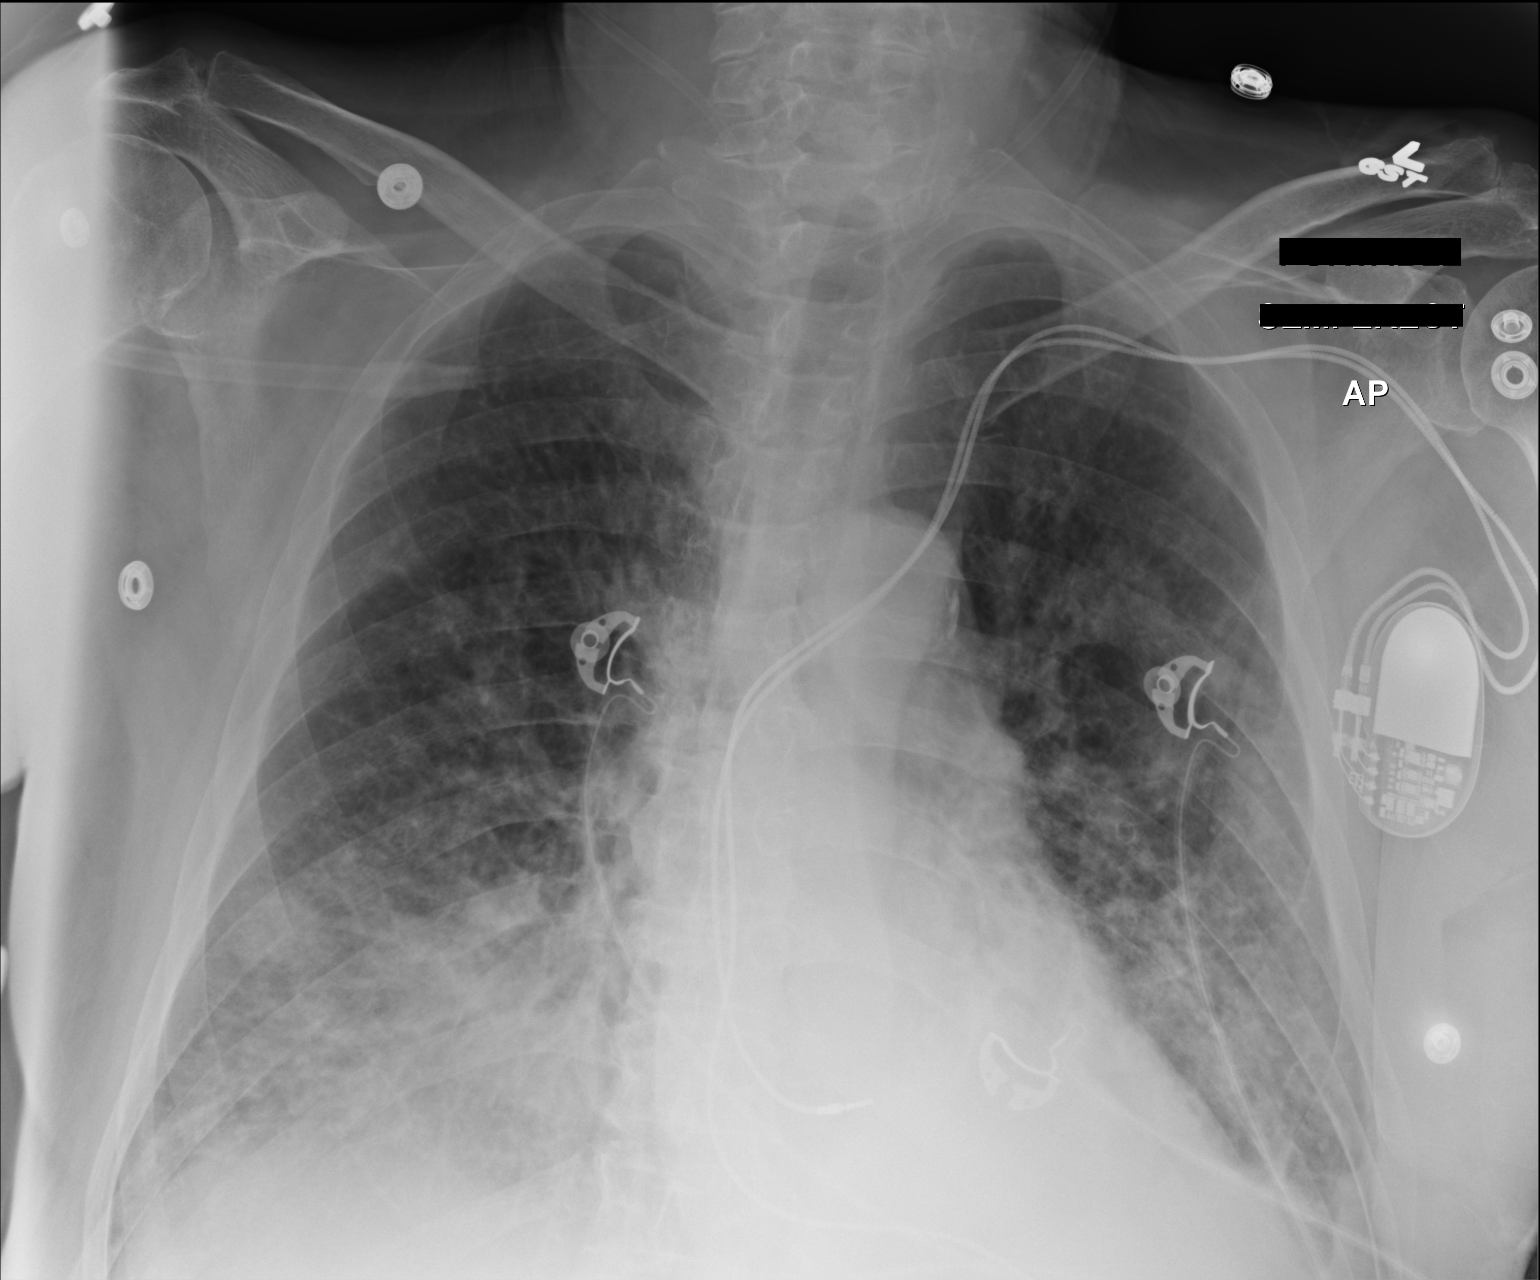

[1 of 1 positions shown; findings below may reference images not displayed]

FINDINGS: The lungs are well-aerated. Bibasilar airspace opacification may
reflect multifocal pneumonia or pulmonary edema. Small bilateral
pleural effusions are suspected, though the costophrenic angles are
incompletely imaged on this study. No pneumothorax is seen.

The cardiomediastinal silhouette is borderline normal in size. A
pacemaker is noted overlying the left chest wall, with leads ending
overlying the right atrium and right ventricle. No acute osseous
abnormalities are seen.
IMPRESSION: Bibasilar airspace opacification may reflect multifocal pneumonia or
pulmonary edema. Suspect small bilateral pleural effusions.

## 2015-05-16 IMAGING — CR DG CHEST 2V
1 series · 1 of 1 positions shown · non-contrast
Comparison: [DATE] [DATE], [DATE] [DATE] a.m.

CLINICAL DATA: Multiple myeloma and pneumonia.

EXAM:
CHEST  2 VIEW

[w chest lat]
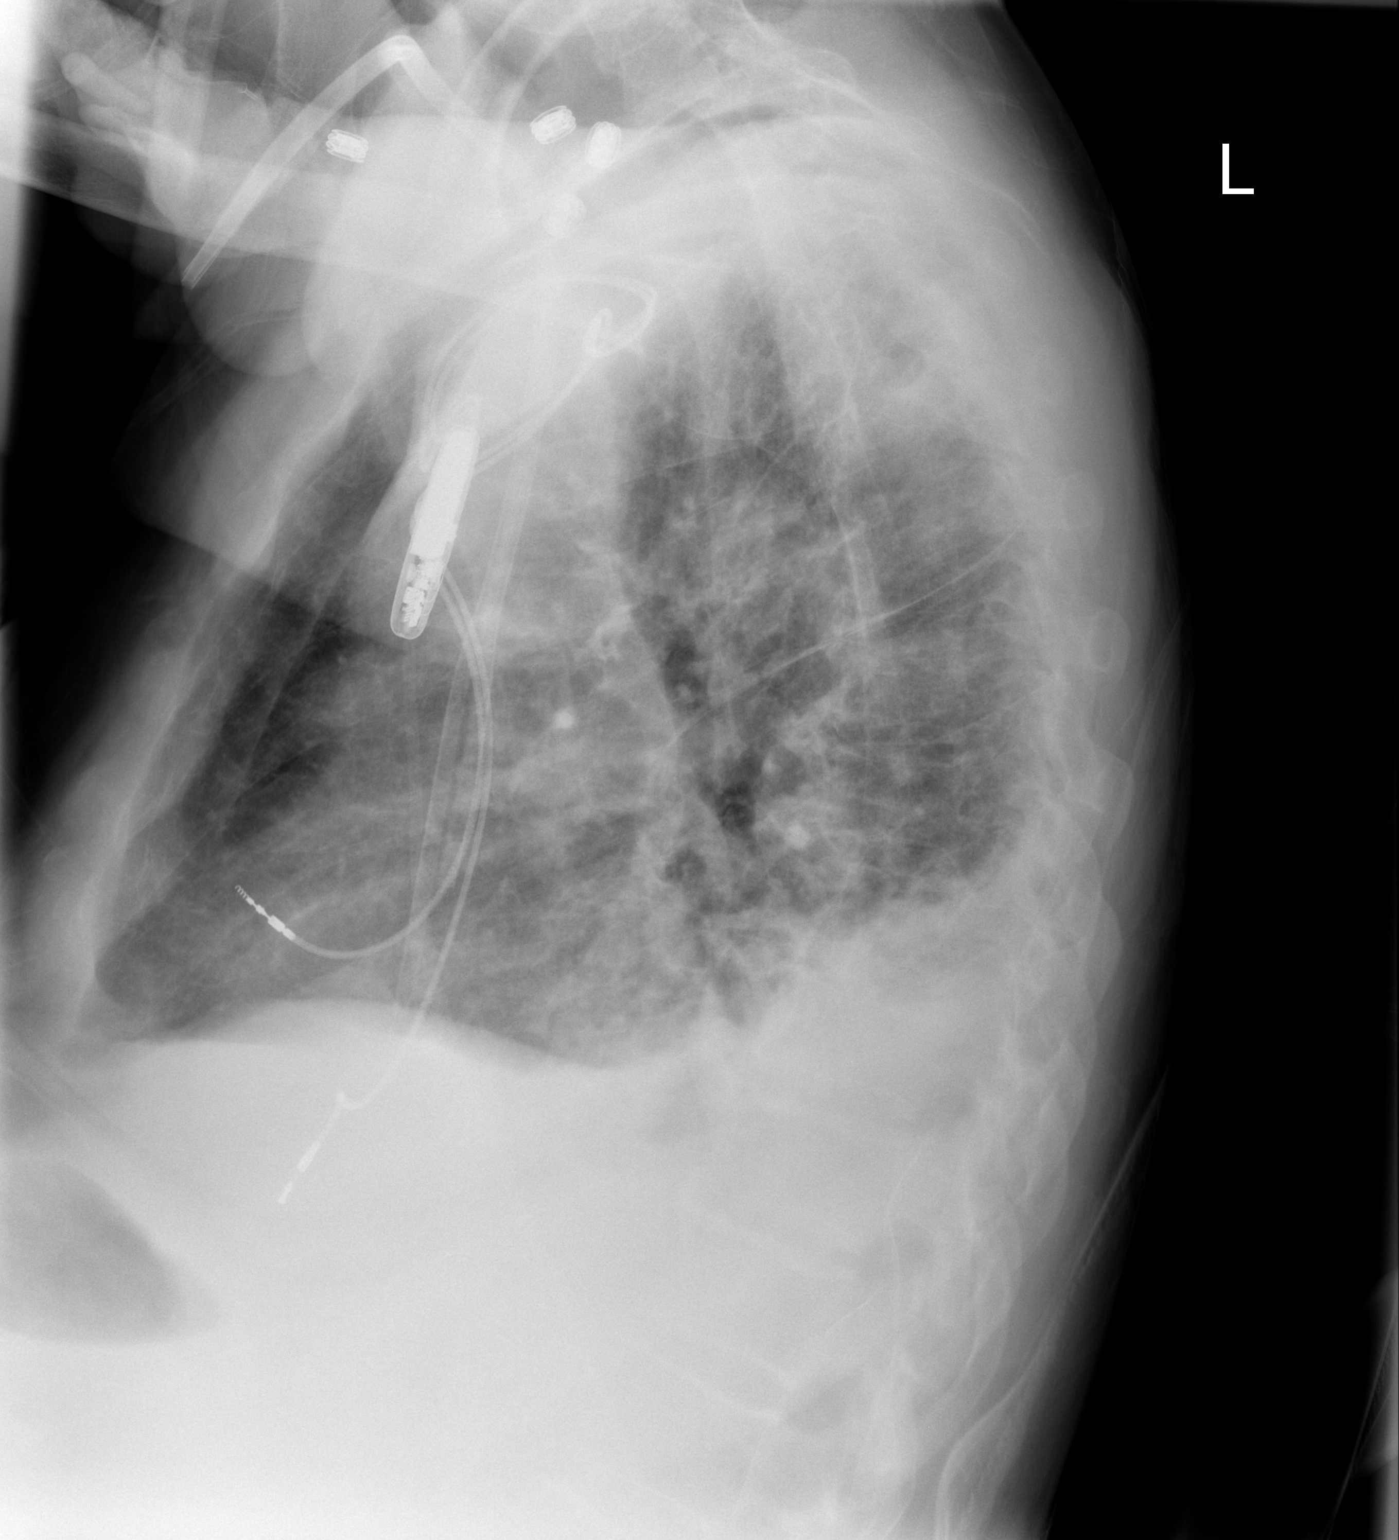

[1 of 1 positions shown; findings below may reference images not displayed]

FINDINGS: The heart size and mediastinal contours are stable. The heart size
is enlarged. Cardiac pacemaker and dual lumen right central venous
line are unchanged. There are consolidation of bilateral lung bases
with small bilateral pleural effusions. There is mild central
pulmonary vascular congestion. The visualized skeletal structures
are stable.
IMPRESSION: Bibasilar pneumonias with small bilateral pleural effusions. Mild
pulmonary vascular congestion.

## 2015-05-17 IMAGING — CR DG CHEST 1V PORT
1 series · 1 of 1 positions shown · non-contrast
Comparison: 08/30/2013.  08/28/2013.

CLINICAL DATA: Cough.  Short of breath.

EXAM:
PORTABLE CHEST - 1 VIEW

[AP]
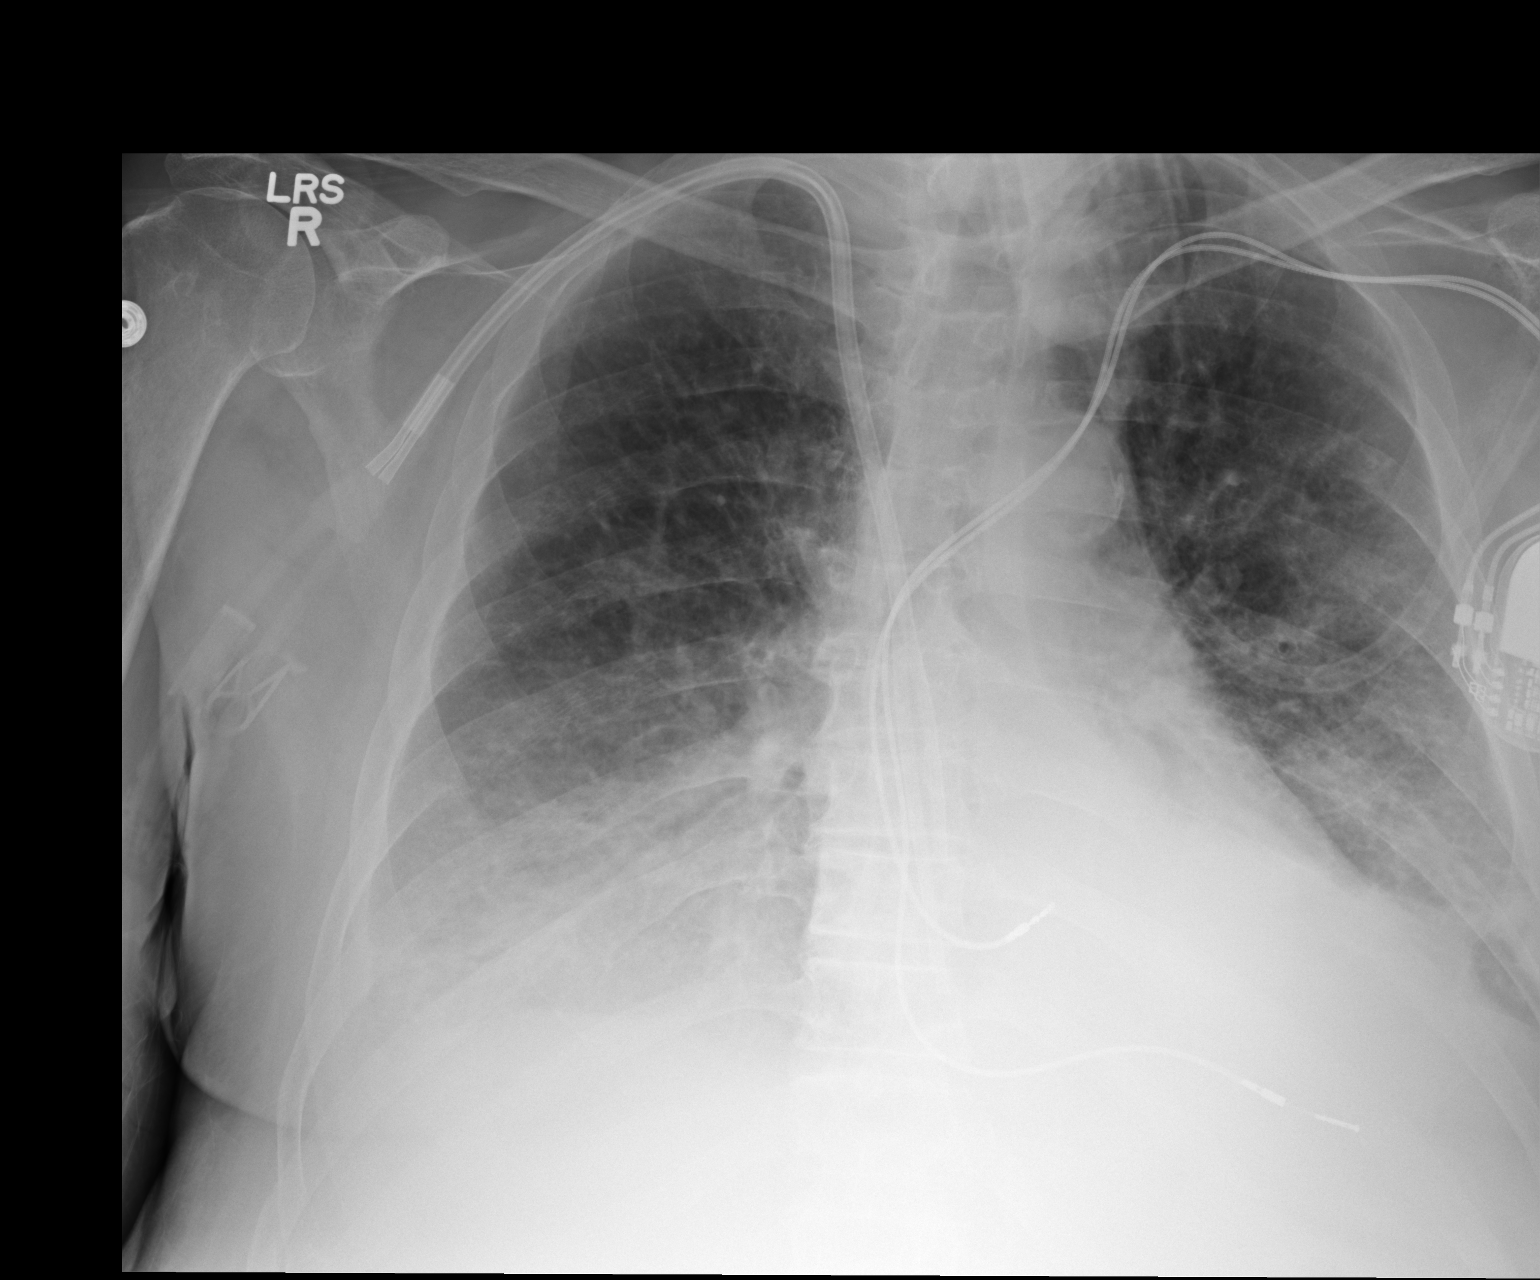

[1 of 1 positions shown; findings below may reference images not displayed]

FINDINGS: Right IJ dialysis catheter is present, unchanged. Two lead left
subclavian pacemaker leads are also unchanged there is moderate
volume overload/ CHF. Dense retrocardiac opacity likely representing
a combination of effusion, atelectasis and edema. Bilateral basilar
airspace disease and atelectasis is present. The airspace disease is
compatible with pulmonary edema.

When comparing to the prior chest radiograph of 08/28/2013,
pulmonary edema is minimally improved.
IMPRESSION: Mildly improved volume overload/ CHF, still moderate. Prominent
basilar opacity represents a combination of pleural effusions,
atelectasis and airspace disease/edema

These results were called by telephone at the time of interpretation
on 09/01/2013 at [DATE] to Dr. MENGCHENG ANDREYEVA , who verbally
acknowledged these results.
